# Patient Record
Sex: Female | Born: 1950 | Race: White | Hispanic: No | Smoking: Never smoker
Health system: Southern US, Community
[De-identification: ages and names within clinical notes are randomized; demographics above are authoritative.]

## PROBLEM LIST (undated history)

## (undated) DIAGNOSIS — N9489 Other specified conditions associated with female genital organs and menstrual cycle: Secondary | ICD-10-CM

## (undated) DIAGNOSIS — N809 Endometriosis, unspecified: Secondary | ICD-10-CM

## (undated) DIAGNOSIS — H269 Unspecified cataract: Secondary | ICD-10-CM

## (undated) DIAGNOSIS — Z973 Presence of spectacles and contact lenses: Secondary | ICD-10-CM

## (undated) DIAGNOSIS — F419 Anxiety disorder, unspecified: Secondary | ICD-10-CM

## (undated) DIAGNOSIS — T7840XA Allergy, unspecified, initial encounter: Secondary | ICD-10-CM

## (undated) DIAGNOSIS — I10 Essential (primary) hypertension: Secondary | ICD-10-CM

## (undated) DIAGNOSIS — M810 Age-related osteoporosis without current pathological fracture: Secondary | ICD-10-CM

## (undated) DIAGNOSIS — E7800 Pure hypercholesterolemia, unspecified: Secondary | ICD-10-CM

## (undated) DIAGNOSIS — Z9889 Other specified postprocedural states: Secondary | ICD-10-CM

## (undated) HISTORY — DX: Pure hypercholesterolemia, unspecified: E78.00

## (undated) HISTORY — DX: Age-related osteoporosis without current pathological fracture: M81.0

## (undated) HISTORY — DX: Unspecified cataract: H26.9

## (undated) HISTORY — DX: Allergy, unspecified, initial encounter: T78.40XA

## (undated) HISTORY — DX: Essential (primary) hypertension: I10

## (undated) HISTORY — DX: Endometriosis, unspecified: N80.9

---

## 1972-08-15 HISTORY — PX: WISDOM TOOTH EXTRACTION: SHX21

## 1976-08-15 HISTORY — PX: OTHER SURGICAL HISTORY: SHX169

## 1998-08-12 ENCOUNTER — Other Ambulatory Visit: Admission: RE | Admit: 1998-08-12 | Discharge: 1998-08-12 | Payer: Self-pay | Admitting: Obstetrics and Gynecology

## 1999-06-22 ENCOUNTER — Encounter (INDEPENDENT_AMBULATORY_CARE_PROVIDER_SITE_OTHER): Payer: Self-pay

## 1999-06-22 ENCOUNTER — Other Ambulatory Visit: Admission: RE | Admit: 1999-06-22 | Discharge: 1999-06-22 | Payer: Self-pay | Admitting: Obstetrics and Gynecology

## 1999-09-13 ENCOUNTER — Other Ambulatory Visit: Admission: RE | Admit: 1999-09-13 | Discharge: 1999-09-13 | Payer: Self-pay | Admitting: Obstetrics and Gynecology

## 2001-10-08 ENCOUNTER — Other Ambulatory Visit: Admission: RE | Admit: 2001-10-08 | Discharge: 2001-10-08 | Payer: Self-pay | Admitting: Gynecology

## 2004-04-02 ENCOUNTER — Other Ambulatory Visit: Admission: RE | Admit: 2004-04-02 | Discharge: 2004-04-02 | Payer: Self-pay | Admitting: Obstetrics and Gynecology

## 2005-10-20 ENCOUNTER — Other Ambulatory Visit: Admission: RE | Admit: 2005-10-20 | Discharge: 2005-10-20 | Payer: Self-pay | Admitting: Obstetrics & Gynecology

## 2008-11-06 ENCOUNTER — Other Ambulatory Visit: Admission: RE | Admit: 2008-11-06 | Discharge: 2008-11-06 | Payer: Self-pay | Admitting: Family Medicine

## 2011-01-06 ENCOUNTER — Other Ambulatory Visit: Payer: Self-pay | Admitting: Family Medicine

## 2011-01-06 DIAGNOSIS — M545 Low back pain, unspecified: Secondary | ICD-10-CM

## 2011-01-15 ENCOUNTER — Ambulatory Visit
Admission: RE | Admit: 2011-01-15 | Discharge: 2011-01-15 | Disposition: A | Discharge status: Home / Self Care | Payer: BC Managed Care – PPO | Source: Ambulatory Visit | Attending: Family Medicine | Admitting: Family Medicine

## 2011-01-15 DIAGNOSIS — M545 Low back pain, unspecified: Secondary | ICD-10-CM

## 2012-08-15 HISTORY — PX: OTHER SURGICAL HISTORY: SHX169

## 2013-03-04 DIAGNOSIS — C4491 Basal cell carcinoma of skin, unspecified: Secondary | ICD-10-CM

## 2013-03-04 HISTORY — DX: Basal cell carcinoma of skin, unspecified: C44.91

## 2016-08-15 HISTORY — PX: OTHER SURGICAL HISTORY: SHX169

## 2017-04-12 ENCOUNTER — Encounter: Payer: Self-pay | Admitting: Obstetrics and Gynecology

## 2017-06-21 ENCOUNTER — Encounter: Payer: Self-pay | Admitting: Obstetrics and Gynecology

## 2019-05-22 DIAGNOSIS — G4700 Insomnia, unspecified: Secondary | ICD-10-CM | POA: Diagnosis not present

## 2019-05-22 DIAGNOSIS — Z7189 Other specified counseling: Secondary | ICD-10-CM | POA: Diagnosis not present

## 2019-05-28 DIAGNOSIS — J301 Allergic rhinitis due to pollen: Secondary | ICD-10-CM | POA: Diagnosis not present

## 2019-05-28 DIAGNOSIS — J3089 Other allergic rhinitis: Secondary | ICD-10-CM | POA: Diagnosis not present

## 2019-06-04 DIAGNOSIS — Z23 Encounter for immunization: Secondary | ICD-10-CM | POA: Diagnosis not present

## 2019-06-06 DIAGNOSIS — H2513 Age-related nuclear cataract, bilateral: Secondary | ICD-10-CM | POA: Diagnosis not present

## 2019-06-06 DIAGNOSIS — H40013 Open angle with borderline findings, low risk, bilateral: Secondary | ICD-10-CM | POA: Diagnosis not present

## 2019-06-06 DIAGNOSIS — H43812 Vitreous degeneration, left eye: Secondary | ICD-10-CM | POA: Diagnosis not present

## 2019-06-06 DIAGNOSIS — H04123 Dry eye syndrome of bilateral lacrimal glands: Secondary | ICD-10-CM | POA: Diagnosis not present

## 2019-06-10 DIAGNOSIS — J301 Allergic rhinitis due to pollen: Secondary | ICD-10-CM | POA: Diagnosis not present

## 2019-06-10 DIAGNOSIS — J3089 Other allergic rhinitis: Secondary | ICD-10-CM | POA: Diagnosis not present

## 2019-06-24 DIAGNOSIS — J301 Allergic rhinitis due to pollen: Secondary | ICD-10-CM | POA: Diagnosis not present

## 2019-06-24 DIAGNOSIS — J3089 Other allergic rhinitis: Secondary | ICD-10-CM | POA: Diagnosis not present

## 2019-06-24 DIAGNOSIS — H1045 Other chronic allergic conjunctivitis: Secondary | ICD-10-CM | POA: Diagnosis not present

## 2019-07-08 DIAGNOSIS — J3089 Other allergic rhinitis: Secondary | ICD-10-CM | POA: Diagnosis not present

## 2019-07-08 DIAGNOSIS — J301 Allergic rhinitis due to pollen: Secondary | ICD-10-CM | POA: Diagnosis not present

## 2019-07-23 DIAGNOSIS — J301 Allergic rhinitis due to pollen: Secondary | ICD-10-CM | POA: Diagnosis not present

## 2019-07-23 DIAGNOSIS — J3089 Other allergic rhinitis: Secondary | ICD-10-CM | POA: Diagnosis not present

## 2019-08-05 DIAGNOSIS — J3089 Other allergic rhinitis: Secondary | ICD-10-CM | POA: Diagnosis not present

## 2019-08-05 DIAGNOSIS — J301 Allergic rhinitis due to pollen: Secondary | ICD-10-CM | POA: Diagnosis not present

## 2019-08-20 DIAGNOSIS — J3089 Other allergic rhinitis: Secondary | ICD-10-CM | POA: Diagnosis not present

## 2019-08-20 DIAGNOSIS — J301 Allergic rhinitis due to pollen: Secondary | ICD-10-CM | POA: Diagnosis not present

## 2019-09-03 DIAGNOSIS — J301 Allergic rhinitis due to pollen: Secondary | ICD-10-CM | POA: Diagnosis not present

## 2019-09-03 DIAGNOSIS — J3089 Other allergic rhinitis: Secondary | ICD-10-CM | POA: Diagnosis not present

## 2019-09-16 DIAGNOSIS — J301 Allergic rhinitis due to pollen: Secondary | ICD-10-CM | POA: Diagnosis not present

## 2019-09-16 DIAGNOSIS — J3089 Other allergic rhinitis: Secondary | ICD-10-CM | POA: Diagnosis not present

## 2019-09-24 DIAGNOSIS — J301 Allergic rhinitis due to pollen: Secondary | ICD-10-CM | POA: Diagnosis not present

## 2019-09-24 DIAGNOSIS — J3089 Other allergic rhinitis: Secondary | ICD-10-CM | POA: Diagnosis not present

## 2019-10-01 DIAGNOSIS — J3089 Other allergic rhinitis: Secondary | ICD-10-CM | POA: Diagnosis not present

## 2019-10-01 DIAGNOSIS — J301 Allergic rhinitis due to pollen: Secondary | ICD-10-CM | POA: Diagnosis not present

## 2019-10-07 DIAGNOSIS — J301 Allergic rhinitis due to pollen: Secondary | ICD-10-CM | POA: Diagnosis not present

## 2019-10-07 DIAGNOSIS — J3089 Other allergic rhinitis: Secondary | ICD-10-CM | POA: Diagnosis not present

## 2019-10-10 DIAGNOSIS — J301 Allergic rhinitis due to pollen: Secondary | ICD-10-CM | POA: Diagnosis not present

## 2019-10-10 DIAGNOSIS — J3089 Other allergic rhinitis: Secondary | ICD-10-CM | POA: Diagnosis not present

## 2019-10-17 DIAGNOSIS — J3089 Other allergic rhinitis: Secondary | ICD-10-CM | POA: Diagnosis not present

## 2019-10-17 DIAGNOSIS — J301 Allergic rhinitis due to pollen: Secondary | ICD-10-CM | POA: Diagnosis not present

## 2019-10-24 DIAGNOSIS — J3089 Other allergic rhinitis: Secondary | ICD-10-CM | POA: Diagnosis not present

## 2019-10-24 DIAGNOSIS — J301 Allergic rhinitis due to pollen: Secondary | ICD-10-CM | POA: Diagnosis not present

## 2019-10-25 DIAGNOSIS — I10 Essential (primary) hypertension: Secondary | ICD-10-CM | POA: Diagnosis not present

## 2019-10-25 DIAGNOSIS — Z1159 Encounter for screening for other viral diseases: Secondary | ICD-10-CM | POA: Diagnosis not present

## 2019-10-30 DIAGNOSIS — G4700 Insomnia, unspecified: Secondary | ICD-10-CM | POA: Diagnosis not present

## 2019-10-30 DIAGNOSIS — Z1159 Encounter for screening for other viral diseases: Secondary | ICD-10-CM | POA: Diagnosis not present

## 2019-10-30 DIAGNOSIS — Z86010 Personal history of colonic polyps: Secondary | ICD-10-CM | POA: Diagnosis not present

## 2019-10-30 DIAGNOSIS — M8580 Other specified disorders of bone density and structure, unspecified site: Secondary | ICD-10-CM | POA: Diagnosis not present

## 2019-10-30 DIAGNOSIS — I10 Essential (primary) hypertension: Secondary | ICD-10-CM | POA: Diagnosis not present

## 2019-10-30 DIAGNOSIS — R69 Illness, unspecified: Secondary | ICD-10-CM | POA: Diagnosis not present

## 2019-10-30 DIAGNOSIS — E785 Hyperlipidemia, unspecified: Secondary | ICD-10-CM | POA: Diagnosis not present

## 2019-10-30 DIAGNOSIS — H6123 Impacted cerumen, bilateral: Secondary | ICD-10-CM | POA: Diagnosis not present

## 2019-10-30 DIAGNOSIS — R8290 Unspecified abnormal findings in urine: Secondary | ICD-10-CM | POA: Diagnosis not present

## 2019-10-30 DIAGNOSIS — Z0000 Encounter for general adult medical examination without abnormal findings: Secondary | ICD-10-CM | POA: Diagnosis not present

## 2019-10-31 DIAGNOSIS — J3089 Other allergic rhinitis: Secondary | ICD-10-CM | POA: Diagnosis not present

## 2019-10-31 DIAGNOSIS — J301 Allergic rhinitis due to pollen: Secondary | ICD-10-CM | POA: Diagnosis not present

## 2019-11-06 DIAGNOSIS — J301 Allergic rhinitis due to pollen: Secondary | ICD-10-CM | POA: Diagnosis not present

## 2019-11-06 DIAGNOSIS — J3089 Other allergic rhinitis: Secondary | ICD-10-CM | POA: Diagnosis not present

## 2019-11-11 DIAGNOSIS — M79662 Pain in left lower leg: Secondary | ICD-10-CM | POA: Diagnosis not present

## 2019-11-13 DIAGNOSIS — J301 Allergic rhinitis due to pollen: Secondary | ICD-10-CM | POA: Diagnosis not present

## 2019-11-13 DIAGNOSIS — J3089 Other allergic rhinitis: Secondary | ICD-10-CM | POA: Diagnosis not present

## 2019-11-19 DIAGNOSIS — J3089 Other allergic rhinitis: Secondary | ICD-10-CM | POA: Diagnosis not present

## 2019-11-19 DIAGNOSIS — J301 Allergic rhinitis due to pollen: Secondary | ICD-10-CM | POA: Diagnosis not present

## 2019-11-26 DIAGNOSIS — J3089 Other allergic rhinitis: Secondary | ICD-10-CM | POA: Diagnosis not present

## 2019-11-26 DIAGNOSIS — J301 Allergic rhinitis due to pollen: Secondary | ICD-10-CM | POA: Diagnosis not present

## 2019-11-28 DIAGNOSIS — R69 Illness, unspecified: Secondary | ICD-10-CM | POA: Diagnosis not present

## 2019-12-09 DIAGNOSIS — J3089 Other allergic rhinitis: Secondary | ICD-10-CM | POA: Diagnosis not present

## 2019-12-09 DIAGNOSIS — J301 Allergic rhinitis due to pollen: Secondary | ICD-10-CM | POA: Diagnosis not present

## 2019-12-23 DIAGNOSIS — J301 Allergic rhinitis due to pollen: Secondary | ICD-10-CM | POA: Diagnosis not present

## 2019-12-23 DIAGNOSIS — J3089 Other allergic rhinitis: Secondary | ICD-10-CM | POA: Diagnosis not present

## 2020-01-06 DIAGNOSIS — J301 Allergic rhinitis due to pollen: Secondary | ICD-10-CM | POA: Diagnosis not present

## 2020-01-06 DIAGNOSIS — J3089 Other allergic rhinitis: Secondary | ICD-10-CM | POA: Diagnosis not present

## 2020-01-15 DIAGNOSIS — Z03818 Encounter for observation for suspected exposure to other biological agents ruled out: Secondary | ICD-10-CM | POA: Diagnosis not present

## 2020-01-20 DIAGNOSIS — K5730 Diverticulosis of large intestine without perforation or abscess without bleeding: Secondary | ICD-10-CM | POA: Diagnosis not present

## 2020-01-20 DIAGNOSIS — Z86010 Personal history of colonic polyps: Secondary | ICD-10-CM | POA: Diagnosis not present

## 2020-01-20 DIAGNOSIS — K635 Polyp of colon: Secondary | ICD-10-CM | POA: Diagnosis not present

## 2020-01-20 LAB — HM COLONOSCOPY

## 2020-01-22 DIAGNOSIS — K635 Polyp of colon: Secondary | ICD-10-CM | POA: Diagnosis not present

## 2020-01-23 DIAGNOSIS — J3089 Other allergic rhinitis: Secondary | ICD-10-CM | POA: Diagnosis not present

## 2020-01-23 DIAGNOSIS — J301 Allergic rhinitis due to pollen: Secondary | ICD-10-CM | POA: Diagnosis not present

## 2020-02-03 DIAGNOSIS — J3081 Allergic rhinitis due to animal (cat) (dog) hair and dander: Secondary | ICD-10-CM | POA: Diagnosis not present

## 2020-02-03 DIAGNOSIS — J3089 Other allergic rhinitis: Secondary | ICD-10-CM | POA: Diagnosis not present

## 2020-02-03 DIAGNOSIS — J301 Allergic rhinitis due to pollen: Secondary | ICD-10-CM | POA: Diagnosis not present

## 2020-02-19 DIAGNOSIS — J301 Allergic rhinitis due to pollen: Secondary | ICD-10-CM | POA: Diagnosis not present

## 2020-02-19 DIAGNOSIS — J3089 Other allergic rhinitis: Secondary | ICD-10-CM | POA: Diagnosis not present

## 2020-02-19 DIAGNOSIS — Z1231 Encounter for screening mammogram for malignant neoplasm of breast: Secondary | ICD-10-CM | POA: Diagnosis not present

## 2020-02-24 DIAGNOSIS — H52223 Regular astigmatism, bilateral: Secondary | ICD-10-CM | POA: Diagnosis not present

## 2020-02-24 DIAGNOSIS — Z0100 Encounter for examination of eyes and vision without abnormal findings: Secondary | ICD-10-CM | POA: Diagnosis not present

## 2020-02-25 DIAGNOSIS — J3089 Other allergic rhinitis: Secondary | ICD-10-CM | POA: Diagnosis not present

## 2020-02-25 DIAGNOSIS — J301 Allergic rhinitis due to pollen: Secondary | ICD-10-CM | POA: Diagnosis not present

## 2020-02-25 DIAGNOSIS — J3081 Allergic rhinitis due to animal (cat) (dog) hair and dander: Secondary | ICD-10-CM | POA: Diagnosis not present

## 2020-02-27 DIAGNOSIS — J3089 Other allergic rhinitis: Secondary | ICD-10-CM | POA: Diagnosis not present

## 2020-02-27 DIAGNOSIS — J301 Allergic rhinitis due to pollen: Secondary | ICD-10-CM | POA: Diagnosis not present

## 2020-03-02 DIAGNOSIS — J3089 Other allergic rhinitis: Secondary | ICD-10-CM | POA: Diagnosis not present

## 2020-03-02 DIAGNOSIS — J301 Allergic rhinitis due to pollen: Secondary | ICD-10-CM | POA: Diagnosis not present

## 2020-03-09 DIAGNOSIS — J3089 Other allergic rhinitis: Secondary | ICD-10-CM | POA: Diagnosis not present

## 2020-03-09 DIAGNOSIS — J301 Allergic rhinitis due to pollen: Secondary | ICD-10-CM | POA: Diagnosis not present

## 2020-03-16 DIAGNOSIS — J3089 Other allergic rhinitis: Secondary | ICD-10-CM | POA: Diagnosis not present

## 2020-03-16 DIAGNOSIS — J301 Allergic rhinitis due to pollen: Secondary | ICD-10-CM | POA: Diagnosis not present

## 2020-03-23 DIAGNOSIS — J301 Allergic rhinitis due to pollen: Secondary | ICD-10-CM | POA: Diagnosis not present

## 2020-03-23 DIAGNOSIS — J3089 Other allergic rhinitis: Secondary | ICD-10-CM | POA: Diagnosis not present

## 2020-03-30 DIAGNOSIS — J3089 Other allergic rhinitis: Secondary | ICD-10-CM | POA: Diagnosis not present

## 2020-03-30 DIAGNOSIS — J301 Allergic rhinitis due to pollen: Secondary | ICD-10-CM | POA: Diagnosis not present

## 2020-04-06 DIAGNOSIS — J3089 Other allergic rhinitis: Secondary | ICD-10-CM | POA: Diagnosis not present

## 2020-04-06 DIAGNOSIS — J301 Allergic rhinitis due to pollen: Secondary | ICD-10-CM | POA: Diagnosis not present

## 2020-04-13 DIAGNOSIS — J3089 Other allergic rhinitis: Secondary | ICD-10-CM | POA: Diagnosis not present

## 2020-04-13 DIAGNOSIS — J301 Allergic rhinitis due to pollen: Secondary | ICD-10-CM | POA: Diagnosis not present

## 2020-04-22 DIAGNOSIS — J3089 Other allergic rhinitis: Secondary | ICD-10-CM | POA: Diagnosis not present

## 2020-04-22 DIAGNOSIS — J301 Allergic rhinitis due to pollen: Secondary | ICD-10-CM | POA: Diagnosis not present

## 2020-04-24 DIAGNOSIS — J301 Allergic rhinitis due to pollen: Secondary | ICD-10-CM | POA: Diagnosis not present

## 2020-04-24 DIAGNOSIS — J3089 Other allergic rhinitis: Secondary | ICD-10-CM | POA: Diagnosis not present

## 2020-05-01 DIAGNOSIS — J3081 Allergic rhinitis due to animal (cat) (dog) hair and dander: Secondary | ICD-10-CM | POA: Diagnosis not present

## 2020-05-01 DIAGNOSIS — J3089 Other allergic rhinitis: Secondary | ICD-10-CM | POA: Diagnosis not present

## 2020-05-06 ENCOUNTER — Ambulatory Visit: Payer: Self-pay | Admitting: Dermatology

## 2020-05-06 DIAGNOSIS — R69 Illness, unspecified: Secondary | ICD-10-CM | POA: Diagnosis not present

## 2020-05-06 DIAGNOSIS — G4700 Insomnia, unspecified: Secondary | ICD-10-CM | POA: Diagnosis not present

## 2020-05-06 DIAGNOSIS — E785 Hyperlipidemia, unspecified: Secondary | ICD-10-CM | POA: Diagnosis not present

## 2020-05-07 DIAGNOSIS — Z228 Carrier of other infectious diseases: Secondary | ICD-10-CM | POA: Diagnosis not present

## 2020-05-15 DIAGNOSIS — J301 Allergic rhinitis due to pollen: Secondary | ICD-10-CM | POA: Diagnosis not present

## 2020-05-15 DIAGNOSIS — J3089 Other allergic rhinitis: Secondary | ICD-10-CM | POA: Diagnosis not present

## 2020-05-29 DIAGNOSIS — J301 Allergic rhinitis due to pollen: Secondary | ICD-10-CM | POA: Diagnosis not present

## 2020-05-29 DIAGNOSIS — J3089 Other allergic rhinitis: Secondary | ICD-10-CM | POA: Diagnosis not present

## 2020-06-11 DIAGNOSIS — R69 Illness, unspecified: Secondary | ICD-10-CM | POA: Diagnosis not present

## 2020-06-12 DIAGNOSIS — J301 Allergic rhinitis due to pollen: Secondary | ICD-10-CM | POA: Diagnosis not present

## 2020-06-12 DIAGNOSIS — J3089 Other allergic rhinitis: Secondary | ICD-10-CM | POA: Diagnosis not present

## 2020-06-23 DIAGNOSIS — H1045 Other chronic allergic conjunctivitis: Secondary | ICD-10-CM | POA: Diagnosis not present

## 2020-06-23 DIAGNOSIS — J3089 Other allergic rhinitis: Secondary | ICD-10-CM | POA: Diagnosis not present

## 2020-06-23 DIAGNOSIS — J301 Allergic rhinitis due to pollen: Secondary | ICD-10-CM | POA: Diagnosis not present

## 2020-06-25 DIAGNOSIS — R69 Illness, unspecified: Secondary | ICD-10-CM | POA: Diagnosis not present

## 2020-07-20 ENCOUNTER — Ambulatory Visit: Payer: Medicare HMO | Admitting: Dermatology

## 2020-07-20 ENCOUNTER — Other Ambulatory Visit: Payer: Self-pay

## 2020-07-20 ENCOUNTER — Encounter: Payer: Self-pay | Admitting: Dermatology

## 2020-07-20 DIAGNOSIS — D2339 Other benign neoplasm of skin of other parts of face: Secondary | ICD-10-CM | POA: Diagnosis not present

## 2020-07-20 DIAGNOSIS — L570 Actinic keratosis: Secondary | ICD-10-CM

## 2020-07-20 DIAGNOSIS — L738 Other specified follicular disorders: Secondary | ICD-10-CM | POA: Diagnosis not present

## 2020-07-20 DIAGNOSIS — Z1283 Encounter for screening for malignant neoplasm of skin: Secondary | ICD-10-CM | POA: Diagnosis not present

## 2020-07-20 DIAGNOSIS — L821 Other seborrheic keratosis: Secondary | ICD-10-CM

## 2020-07-20 DIAGNOSIS — L700 Acne vulgaris: Secondary | ICD-10-CM | POA: Diagnosis not present

## 2020-07-20 DIAGNOSIS — D1801 Hemangioma of skin and subcutaneous tissue: Secondary | ICD-10-CM | POA: Diagnosis not present

## 2020-07-20 NOTE — Patient Instructions (Addendum)
Routine follow-up visit for Tracey Ayala date of birth 22-Sep-1950.  Of most concern was a crust on her left mid deltoid which fits a lichenoid actinic keratosis, sometimes called a precancer.  If 5-second liquid nitrogen freeze does not succeed, you should return sometime after the holidays for a tiny biopsy.  A similar lesion on the left cheek was also treated with liquid nitrogen freeze.  Otherwise from feet to scalp there are no atypical moles or any lesions suggestive of skin cancer.  5 types of clinically benign spots: (#1) tiny flesh-colored bumps on the forehead called sebaceous hyperplasia; #2) a 1 mm red dot on the left tip of the nose that fits an angiofibroma; #3 many dozen 1 to 4 mm red dots mostly on the torso that are called cherry angiomas; #4 (rough tan spots particularly on the back called seborrheic keratoses; #5 a dark dot on the left temple that was gently expressed and is a tiny open cyst that will eventually recur but is harmless.  Please let the office know in a month with a spot on the left shoulder goes away.

## 2020-07-21 DIAGNOSIS — J301 Allergic rhinitis due to pollen: Secondary | ICD-10-CM | POA: Diagnosis not present

## 2020-07-21 DIAGNOSIS — J3089 Other allergic rhinitis: Secondary | ICD-10-CM | POA: Diagnosis not present

## 2020-07-24 ENCOUNTER — Encounter: Payer: Self-pay | Admitting: Dermatology

## 2020-07-24 NOTE — Progress Notes (Signed)
Follow-Up Visit   Subjective  Tracey Ayala is a 69 y.o. female who presents for the following: Annual Exam (left shoulder, right leg, left nose = nonhealing crust).  General skin examination, several new issues Location:  Duration:  Quality:  Associated Signs/Symptoms: Modifying Factors:  Severity:  Timing: Context:   Objective  Well appearing patient in no apparent distress; mood and affect are within normal limits. Objective  Chest - Medial [facility]): Complete skin examination, no signs of atypical moles, melanoma or non mole skin cancer.  Objective  Left Upper Arm - Anterior: 1 cm flat pink crust  Objective  Left Thigh - Anterior, Left Upper Back, Right Lower Back, Right Thigh - Anterior, Umbilicus: Multiple 1 to 2 mm smooth red dots  Objective  Mid Forehead: Half dozen 2 mm umbilicated flesh-colored papules  Objective  Mid Tip of Nose: 2 mm domed flesh-colored papule.  Objective  Left Malar Cheek: Ten 15mm flattopped textured papule  Objective  Left Malar Cheek: 4 mm pink crust    A full examination was performed including scalp, head, eyes, ears, nose, lips, neck, chest, axillae, abdomen, back, buttocks, bilateral upper extremities, bilateral lower extremities, hands, feet, fingers, toes, fingernails, and toenails. All findings within normal limits unless otherwise noted below.   Assessment & Plan    Screening exam for skin cancer Chest - Medial [facility])  Yearly skin check, patient encouraged to examine her own skin twice annually.  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  Black head Left Temple  Benign   Sebaceous hyperplasia Mid Forehead  Benign okay to leave.  Did  warn of the similarity in appearance to an early basal cell carcinoma so if any grow or bleed, return for biopsy.  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  Did detail similarity in appearance to early basal cell carcinoma.  Seborrheic keratosis Left Malar Cheek  Benign okay to leave if stable  AK (actinic keratosis) Left Malar Cheek  Destruction of lesion - Left Malar Cheek Complexity: simple   Destruction method: cryotherapy   Informed consent: discussed and consent obtained   Lesion destroyed using liquid nitrogen: Yes   Cryotherapy cycles:  5 Outcome: patient tolerated procedure well with no complications     Routine follow-up visit for Tracey Ayala date of birth 1951/04/23.  Of most concern was a crust on her left mid deltoid which fits a lichenoid actinic keratosis, sometimes called a precancer.  If 5-second liquid nitrogen freeze does not succeed, you should return sometime after the holidays for a tiny biopsy.  A similar lesion on the left cheek was also treated with liquid nitrogen freeze.  Otherwise from feet to scalp there are no atypical moles or any lesions suggestive of skin cancer.  5 types of clinically benign spots: (#1) tiny flesh-colored bumps on the forehead called sebaceous hyperplasia; #2) a 1 mm red  dot on the left tip of the nose that fits an angiofibroma; #3 many dozen 1 to 4 mm red dots mostly on the torso that are called cherry angiomas; #4 (rough tan spots particularly on the back called seborrheic keratoses; #5 a dark dot on the left temple that was gently expressed and is a tiny open cyst that  will eventually recur but is harmless.  Please let the office know in a month with a spot on the left shoulder goes away.   I, Lavonna Monarch, MD, have reviewed all documentation for this visit.  The documentation on 07/24/20 for the exam, diagnosis, procedures, and orders are all accurate and complete.

## 2020-08-11 DIAGNOSIS — J3089 Other allergic rhinitis: Secondary | ICD-10-CM | POA: Diagnosis not present

## 2020-08-11 DIAGNOSIS — J301 Allergic rhinitis due to pollen: Secondary | ICD-10-CM | POA: Diagnosis not present

## 2020-08-24 DIAGNOSIS — J3089 Other allergic rhinitis: Secondary | ICD-10-CM | POA: Diagnosis not present

## 2020-08-24 DIAGNOSIS — J301 Allergic rhinitis due to pollen: Secondary | ICD-10-CM | POA: Diagnosis not present

## 2020-09-09 DIAGNOSIS — J069 Acute upper respiratory infection, unspecified: Secondary | ICD-10-CM | POA: Diagnosis not present

## 2020-09-11 DIAGNOSIS — J301 Allergic rhinitis due to pollen: Secondary | ICD-10-CM | POA: Diagnosis not present

## 2020-09-11 DIAGNOSIS — J3089 Other allergic rhinitis: Secondary | ICD-10-CM | POA: Diagnosis not present

## 2020-09-15 DIAGNOSIS — J301 Allergic rhinitis due to pollen: Secondary | ICD-10-CM | POA: Diagnosis not present

## 2020-09-15 DIAGNOSIS — J3089 Other allergic rhinitis: Secondary | ICD-10-CM | POA: Diagnosis not present

## 2020-09-22 DIAGNOSIS — J3089 Other allergic rhinitis: Secondary | ICD-10-CM | POA: Diagnosis not present

## 2020-09-22 DIAGNOSIS — J301 Allergic rhinitis due to pollen: Secondary | ICD-10-CM | POA: Diagnosis not present

## 2020-09-29 DIAGNOSIS — J301 Allergic rhinitis due to pollen: Secondary | ICD-10-CM | POA: Diagnosis not present

## 2020-09-29 DIAGNOSIS — J3089 Other allergic rhinitis: Secondary | ICD-10-CM | POA: Diagnosis not present

## 2020-10-06 DIAGNOSIS — J301 Allergic rhinitis due to pollen: Secondary | ICD-10-CM | POA: Diagnosis not present

## 2020-10-06 DIAGNOSIS — J3089 Other allergic rhinitis: Secondary | ICD-10-CM | POA: Diagnosis not present

## 2020-10-13 DIAGNOSIS — J301 Allergic rhinitis due to pollen: Secondary | ICD-10-CM | POA: Diagnosis not present

## 2020-10-13 DIAGNOSIS — J3089 Other allergic rhinitis: Secondary | ICD-10-CM | POA: Diagnosis not present

## 2020-10-20 DIAGNOSIS — J301 Allergic rhinitis due to pollen: Secondary | ICD-10-CM | POA: Diagnosis not present

## 2020-10-20 DIAGNOSIS — J3089 Other allergic rhinitis: Secondary | ICD-10-CM | POA: Diagnosis not present

## 2020-10-28 DIAGNOSIS — J3089 Other allergic rhinitis: Secondary | ICD-10-CM | POA: Diagnosis not present

## 2020-10-28 DIAGNOSIS — J301 Allergic rhinitis due to pollen: Secondary | ICD-10-CM | POA: Diagnosis not present

## 2020-11-03 DIAGNOSIS — J301 Allergic rhinitis due to pollen: Secondary | ICD-10-CM | POA: Diagnosis not present

## 2020-11-03 DIAGNOSIS — J3089 Other allergic rhinitis: Secondary | ICD-10-CM | POA: Diagnosis not present

## 2020-11-07 DIAGNOSIS — J301 Allergic rhinitis due to pollen: Secondary | ICD-10-CM | POA: Diagnosis not present

## 2020-11-07 DIAGNOSIS — J3089 Other allergic rhinitis: Secondary | ICD-10-CM | POA: Diagnosis not present

## 2020-11-09 DIAGNOSIS — Z0000 Encounter for general adult medical examination without abnormal findings: Secondary | ICD-10-CM | POA: Diagnosis not present

## 2020-11-10 DIAGNOSIS — J301 Allergic rhinitis due to pollen: Secondary | ICD-10-CM | POA: Diagnosis not present

## 2020-11-10 DIAGNOSIS — J3089 Other allergic rhinitis: Secondary | ICD-10-CM | POA: Diagnosis not present

## 2020-11-16 DIAGNOSIS — R69 Illness, unspecified: Secondary | ICD-10-CM | POA: Diagnosis not present

## 2020-11-16 DIAGNOSIS — Z2821 Immunization not carried out because of patient refusal: Secondary | ICD-10-CM | POA: Diagnosis not present

## 2020-11-16 DIAGNOSIS — R1909 Other intra-abdominal and pelvic swelling, mass and lump: Secondary | ICD-10-CM | POA: Diagnosis not present

## 2020-11-16 DIAGNOSIS — G4700 Insomnia, unspecified: Secondary | ICD-10-CM | POA: Diagnosis not present

## 2020-11-16 DIAGNOSIS — I10 Essential (primary) hypertension: Secondary | ICD-10-CM | POA: Diagnosis not present

## 2020-11-17 DIAGNOSIS — J3089 Other allergic rhinitis: Secondary | ICD-10-CM | POA: Diagnosis not present

## 2020-11-17 DIAGNOSIS — J301 Allergic rhinitis due to pollen: Secondary | ICD-10-CM | POA: Diagnosis not present

## 2020-11-19 ENCOUNTER — Other Ambulatory Visit: Payer: Self-pay | Admitting: Family Medicine

## 2020-11-19 DIAGNOSIS — R1909 Other intra-abdominal and pelvic swelling, mass and lump: Secondary | ICD-10-CM

## 2020-11-23 DIAGNOSIS — H52223 Regular astigmatism, bilateral: Secondary | ICD-10-CM | POA: Diagnosis not present

## 2020-11-26 DIAGNOSIS — H2512 Age-related nuclear cataract, left eye: Secondary | ICD-10-CM | POA: Diagnosis not present

## 2020-11-26 DIAGNOSIS — H18413 Arcus senilis, bilateral: Secondary | ICD-10-CM | POA: Diagnosis not present

## 2020-11-26 DIAGNOSIS — H25043 Posterior subcapsular polar age-related cataract, bilateral: Secondary | ICD-10-CM | POA: Diagnosis not present

## 2020-11-26 DIAGNOSIS — H25013 Cortical age-related cataract, bilateral: Secondary | ICD-10-CM | POA: Diagnosis not present

## 2020-11-26 DIAGNOSIS — H2513 Age-related nuclear cataract, bilateral: Secondary | ICD-10-CM | POA: Diagnosis not present

## 2020-11-27 ENCOUNTER — Other Ambulatory Visit: Payer: Self-pay

## 2020-11-27 ENCOUNTER — Emergency Department (HOSPITAL_COMMUNITY)
Admission: EM | Admit: 2020-11-27 | Discharge: 2020-11-27 | Disposition: A | Discharge status: Home / Self Care | Payer: Medicare HMO | Attending: Emergency Medicine | Admitting: Emergency Medicine

## 2020-11-27 ENCOUNTER — Emergency Department (HOSPITAL_COMMUNITY): Payer: Medicare HMO

## 2020-11-27 DIAGNOSIS — R918 Other nonspecific abnormal finding of lung field: Secondary | ICD-10-CM | POA: Insufficient documentation

## 2020-11-27 DIAGNOSIS — R109 Unspecified abdominal pain: Secondary | ICD-10-CM | POA: Diagnosis not present

## 2020-11-27 DIAGNOSIS — R1032 Left lower quadrant pain: Secondary | ICD-10-CM | POA: Diagnosis not present

## 2020-11-27 DIAGNOSIS — R110 Nausea: Secondary | ICD-10-CM | POA: Diagnosis not present

## 2020-11-27 DIAGNOSIS — Z85828 Personal history of other malignant neoplasm of skin: Secondary | ICD-10-CM | POA: Diagnosis not present

## 2020-11-27 DIAGNOSIS — G8929 Other chronic pain: Secondary | ICD-10-CM | POA: Insufficient documentation

## 2020-11-27 DIAGNOSIS — R1900 Intra-abdominal and pelvic swelling, mass and lump, unspecified site: Secondary | ICD-10-CM | POA: Diagnosis not present

## 2020-11-27 DIAGNOSIS — R1084 Generalized abdominal pain: Secondary | ICD-10-CM | POA: Diagnosis not present

## 2020-11-27 DIAGNOSIS — Z800 Family history of malignant neoplasm of digestive organs: Secondary | ICD-10-CM | POA: Insufficient documentation

## 2020-11-27 DIAGNOSIS — R1030 Lower abdominal pain, unspecified: Secondary | ICD-10-CM | POA: Diagnosis present

## 2020-11-27 DIAGNOSIS — Z885 Allergy status to narcotic agent status: Secondary | ICD-10-CM | POA: Insufficient documentation

## 2020-11-27 DIAGNOSIS — I10 Essential (primary) hypertension: Secondary | ICD-10-CM | POA: Diagnosis not present

## 2020-11-27 DIAGNOSIS — Z803 Family history of malignant neoplasm of breast: Secondary | ICD-10-CM | POA: Insufficient documentation

## 2020-11-27 DIAGNOSIS — R1903 Right lower quadrant abdominal swelling, mass and lump: Secondary | ICD-10-CM

## 2020-11-27 DIAGNOSIS — R1031 Right lower quadrant pain: Secondary | ICD-10-CM | POA: Diagnosis not present

## 2020-11-27 LAB — COMPREHENSIVE METABOLIC PANEL
ALT: 20 U/L (ref 0–44)
AST: 26 U/L (ref 15–41)
Albumin: 3.8 g/dL (ref 3.5–5.0)
Alkaline Phosphatase: 52 U/L (ref 38–126)
Anion gap: 6 (ref 5–15)
BUN: 9 mg/dL (ref 8–23)
CO2: 28 mmol/L (ref 22–32)
Calcium: 8.6 mg/dL — ABNORMAL LOW (ref 8.9–10.3)
Chloride: 103 mmol/L (ref 98–111)
Creatinine, Ser: 0.69 mg/dL (ref 0.44–1.00)
GFR, Estimated: >60 mL/min (ref >60)
Glucose, Bld: 142 mg/dL — ABNORMAL HIGH (ref 70–99)
Potassium: 3.2 mmol/L — ABNORMAL LOW (ref 3.5–5.1)
Sodium: 137 mmol/L (ref 135–145)
Total Bilirubin: 1.1 mg/dL (ref 0.3–1.2)
Total Protein: 6 g/dL — ABNORMAL LOW (ref 6.5–8.1)

## 2020-11-27 LAB — CBC WITH DIFFERENTIAL/PLATELET
Abs Immature Granulocytes: 0.02 10*3/uL (ref 0.00–0.07)
Basophils Absolute: 0 10*3/uL (ref 0.0–0.1)
Basophils Relative: 0 %
Eosinophils Absolute: 0 10*3/uL (ref 0.0–0.5)
Eosinophils Relative: 0 %
HCT: 37.7 % (ref 36.0–46.0)
Hemoglobin: 13.2 g/dL (ref 12.0–15.0)
Immature Granulocytes: 0 %
Lymphocytes Relative: 7 %
Lymphs Abs: 0.6 10*3/uL — ABNORMAL LOW (ref 0.7–4.0)
MCH: 31 pg (ref 26.0–34.0)
MCHC: 35 g/dL (ref 30.0–36.0)
MCV: 88.5 fL (ref 80.0–100.0)
Monocytes Absolute: 0.3 10*3/uL (ref 0.1–1.0)
Monocytes Relative: 3 %
Neutro Abs: 7.6 10*3/uL (ref 1.7–7.7)
Neutrophils Relative %: 90 %
Platelets: 185 10*3/uL (ref 150–400)
RBC: 4.26 MIL/uL (ref 3.87–5.11)
RDW: 12.5 % (ref 11.5–15.5)
WBC: 8.5 10*3/uL (ref 4.0–10.5)
nRBC: 0 % (ref 0.0–0.2)

## 2020-11-27 LAB — LACTATE DEHYDROGENASE: LDH: 182 U/L (ref 98–192)

## 2020-11-27 LAB — HCG, QUANTITATIVE, PREGNANCY: hCG, Beta Chain, Quant, S: 4 m[IU]/mL (ref <5)

## 2020-11-27 MED ORDER — TRAMADOL HCL 50 MG PO TABS
50.0000 mg | ORAL_TABLET | Freq: Once | ORAL | Status: DC
  Filled 2020-11-27: qty 1

## 2020-11-27 MED ORDER — ONDANSETRON HCL 4 MG/2ML IJ SOLN
4.0000 mg | Freq: Once | INTRAMUSCULAR | Status: AC
  Administered 2020-11-27: 4 mg via INTRAVENOUS
  Filled 2020-11-27: qty 2

## 2020-11-27 MED ORDER — TRAMADOL HCL 50 MG PO TABS: 50.0000 mg | ORAL_TABLET | Freq: Four times a day (QID) | ORAL | 0 refills | Status: AC | PRN

## 2020-11-27 MED ORDER — FENTANYL CITRATE (PF) 100 MCG/2ML IJ SOLN
50.0000 ug | Freq: Once | INTRAMUSCULAR | Status: AC
Start: 2020-11-27 — End: 2020-11-27
  Administered 2020-11-27: 50 ug via INTRAVENOUS
  Filled 2020-11-27: qty 2

## 2020-11-27 MED ORDER — IOHEXOL 300 MG/ML  SOLN
100.0000 mL | Freq: Once | INTRAMUSCULAR | Status: AC | PRN
  Administered 2020-11-27: 100 mL via INTRAVENOUS

## 2020-11-27 MED ORDER — IOHEXOL 300 MG/ML  SOLN: 100.0000 mL | Freq: Once | INTRAMUSCULAR | Status: DC | PRN

## 2020-11-27 MED ORDER — ONDANSETRON HCL 4 MG PO TABS: 4.0000 mg | ORAL_TABLET | Freq: Three times a day (TID) | ORAL | 0 refills | Status: DC | PRN

## 2020-11-27 NOTE — Consult Note (Signed)
Gynecology Consult Note  Tracey Ayala is a 70 y.o. G0 patient that was previously seen at our practice [facility].  She presents with acute on chronic abdominal pain, GYN is consulted for large solid pelvic mass.  Patient reports ~1 year of intermittent abdominal pain that has progressed over the last several weeks. She presented recently to PCP office and a mass was palpated and outpatient imaging ordered.  She also was seen at ophthalmologist and reports her position exacerbated her pain, such that she sought care here at ED tonight.   The pain is dull, sometimes sharp, favors R side.   She reports recent weight loss and decreased appetite. She denies vaginal bleeding or discharge. She reports occasional bloating or fullness. She denies changes in bowel or bladder. Occasionally she has nausea with pain exacerbation and did vomit earlier today.  Denies fever, chills, SOB, CP, LE edema or pain.  Pertinent history includes endometriosis and underwent left ovarian cystectomy several decades ago.  She has family history of colon cancer, as well as breast cancer in an aunt.   Vitals:   11/27/20 2130 11/27/20 2145  BP: (!) 178/93 (!) 169/96  Pulse: 96 92  Resp: 17 18  Temp:    SpO2: 99% 100%   Physical exam: Gen: no distress, alert, pleasant Chest: nonlabored breathing Abdomen: soft, nondistended, palpable mass in lower abdomen just R of midline.  Mobile, nontender.  No nodularity in abdominal wall.   Exam is nonacute, without rebound or guarding.  Ext: no edema, no signs of DVT  CTAP 4/15:  There is a large predominantly solid right adnexal mass measuring 9.6 x 10.1 x 6.7 cm, highly concerning for right ovarian neoplasm. This mass results in extrinsic mass effect upon the inferior vena cava. Gynecologic consultation recommended. The uterus is atrophic. Left adnexa is grossly normal.  Trace free fluid in RLQ, minimal right pleural thickening.   A/P:  - Pelvic mass concerning  for ovarian origin.  Given patient age, size, solid features, I have high concern for malignancy.  - Nonacute exam, dispo per ED. Will follow if admitted or observed inpatient.   - Tumor markers ordered.   - Discussed with patient in detail, and recommend GYN Oncology referral.  Given stability and pending lab workup, believe a timely ambulatory referral is appropriate.   ED will place referral but our office will also follow up to ensure continued care.  Please contact with any other questions or concerns. Alpha Gula MD OBG[facility] [place]

## 2020-11-27 NOTE — Discharge Instructions (Addendum)
You have been seen and discharged from the emergency department.  You were found to have a pelvic mass.  You have been referred to gynecologic oncology.  They should call you to set up an appointment, if you do not hear from them by mid next week I would contact them.  Take Tylenol/ibuprofen as needed for pain control.  You have been sent a prescription for tramadol.  Take this medication as needed for pain control, use nausea medicine as needed.  Do not mix this pain medication with Klonopin or alcohol or other sedating medications. Do not drive or do heavy physical activity and to know how this medication affects you.  It may cause drowsiness.  Follow-up with your primary provider for reevaluation and further care. Take home medications as prescribed. If you have any worsening symptoms or further concerns for health please return to an emergency department for further evaluation.

## 2020-11-27 NOTE — ED Notes (Signed)
Pt ambulated to bathroom with Dyann Ruddle, NT

## 2020-11-27 NOTE — ED Notes (Signed)
Pt transported to CT ?

## 2020-11-27 NOTE — ED Notes (Signed)
E-signature pad unavailable at time of pt discharge. This RN discussed discharge materials with pt and answered all pt questions. Pt stated understanding of discharge material. ? ?

## 2020-11-27 NOTE — ED Notes (Signed)
Pt returned from CT °

## 2020-11-27 NOTE — ED Triage Notes (Signed)
BIB EMS from home for abd pain. Rec'd 150 mcg fentanyl, 4 mg zofran, and 500 ml NS. Patient saw her PCP last Monday and had fullness in lower abd. Today at 5 am voided normally. None since then. Now reports right low pelvic pain.

## 2020-11-27 NOTE — ED Provider Notes (Signed)
[facility] EMERGENCY DEPARTMENT Provider Note   CSN: [id number] Arrival date & time: 11/27/20  1531     History Chief Complaint  Patient presents with  . Abdominal Pain    Tracey Ayala is a 70 y.o. female.  HPI   70 year old female with past medical history of basal cell carcinoma presents the emergency department with concern for lower abdominal discomfort.  Patient states that this has been going on intermittently for the past couple months.  She said the pain is mainly in the right lower quadrant, crampy/sharp in nature.  She denies any diarrhea but endorses decreased appetite, mild nausea.  She sometimes feels like she is a difficult time relieving herself of urine but denies any dysuria or hematuria.  She was seen by her primary doctor about a week ago and they were concerned for a palpable firm mass in her lower abdomen, they had ordered CT and ultrasound of which she has not been able to get done yet.  She presents today because the pain became more severe.  Denies any fever.  Past Medical History:  Diagnosis Date  . Basal cell carcinoma 03/04/2013   bcc right nostril =mohs    There are no problems to display for this patient.   No past surgical history on file.   OB History   No obstetric history on file.     No family history on file.  Social History   Tobacco Use  . Smoking status: Never Smoker  . Smokeless tobacco: Never Used  Vaping Use  . Vaping Use: Never used  Substance Use Topics  . Alcohol use: Never  . Drug use: Never    Home Medications Prior to Admission medications   Medication Sig Start Date End Date Taking? Authorizing Provider  cholecalciferol (VITAMIN D3) 25 MCG (1000 UNIT) tablet Take 1,000 Units by mouth daily.   Yes [provider]  citalopram (CELEXA) 20 MG tablet Take 20 mg by mouth every evening. 06/05/20  Yes [provider]  clonazePAM (KLONOPIN) 0.5 MG tablet Take 0.5 mg by mouth at  bedtime. 06/19/20  Yes [provider]  lisinopril (ZESTRIL) 10 MG tablet Take 10 mg by mouth daily. 06/02/20  Yes [provider]  Loteprednol Etabonate 0.5 % GEL Apply 1 drop to eye 4 (four) times daily. 11/23/20  Yes [provider]  Menaquinone-7 (VITAMIN K2 PO) Take 1 tablet by mouth daily.   Yes [provider]  Omega-3 Fatty Acids (FISH OIL) 1000 MG CAPS Take 1 capsule by mouth daily.   Yes [provider]  vitamin C (ASCORBIC ACID) 500 MG tablet Take 500 mg by mouth daily.   Yes [provider]    Allergies    Codeine  Review of Systems   Review of Systems  Constitutional: Negative for chills and fever.  HENT: Negative for congestion.   Eyes: Negative for visual disturbance.  Respiratory: Negative for shortness of breath.   Cardiovascular: Negative for chest pain.  Gastrointestinal: Positive for abdominal pain and nausea. Negative for blood in stool, constipation, diarrhea, rectal pain and vomiting.  Genitourinary: Positive for difficulty urinating. Negative for dysuria.  Skin: Negative for rash.  Neurological: Negative for headaches.    Physical Exam Updated Vital Signs BP 137/81   Pulse 69   Temp 97.9 F (36.6 C) (Oral)   Resp 11   SpO2 100%   Physical Exam Vitals and nursing note reviewed.  Constitutional:      Appearance: Normal  appearance.  HENT:     Head: Normocephalic.     Mouth/Throat:     Mouth: Mucous membranes are moist.  Cardiovascular:     Rate and Rhythm: Normal rate.  Pulmonary:     Effort: Pulmonary effort is normal. No respiratory distress.  Abdominal:     Tenderness: There is abdominal tenderness in the right lower quadrant and suprapubic area. There is no guarding.     Hernia: No hernia is present.     Comments: Firm palpable mass in lower abdomen  Skin:    General: Skin is warm.  Neurological:     Mental Status: She is alert and oriented to person, place, and time. Mental status is at  baseline.  Psychiatric:        Mood and Affect: Mood normal.     ED Results / Procedures / Treatments   Labs (all labs ordered are listed, but only abnormal results are displayed) Labs Reviewed  CBC WITH DIFFERENTIAL/PLATELET - Abnormal; Notable for the following components:      Result Value   Lymphs Abs 0.6 (*)    All other components within normal limits  COMPREHENSIVE METABOLIC PANEL - Abnormal; Notable for the following components:   Potassium 3.2 (*)    Glucose, Bld 142 (*)    Calcium 8.6 (*)    Total Protein 6.0 (*)    All other components within normal limits    EKG None  Radiology No results found.  Procedures Procedures   Medications Ordered in ED Medications - No data to display  ED Course  I have reviewed the triage vital signs and the nursing notes.  Pertinent labs & imaging results that were available during my care of the patient were reviewed by me and considered in my medical decision making (see chart for details).    MDM Rules/Calculators/A&P                          70 year old female presents emergency department for evaluation of palpable lower abdominal mass.  CT shows a large adnexal mass concerning for ovarian cancer, there is some impingement on IVC.  Gynecology was consulted.  [facility] on-call physician Dr. Mardelle Matte reviewed her imaging and spoke with the patient personally.    Our plan is for discharge, outpatient follow-up with Am[facility] Kingfisher (ambulatory referral has been placed) and symptomatic pain control.  Dr. Mardelle Matte says that his office will also follow-up with the patient next week.  Patient is comfortable going home.  Patient has a codeine allergy but she states her allergy is nausea, she is never tried tramadol in the past, she is requesting stronger medicine than Tylenol and ibuprofen.  Will trial tramadol with home nausea meds.  Patient will be discharged and treated as an outpatient.  Discharge  plan and strict return to ED precautions discussed, patient verbalizes understanding and agreement.  Final Clinical Impression(s) / ED Diagnoses Final diagnoses:  None    Rx / DC Orders ED Discharge Orders    None       Lorelle Gibbs, DO 11/27/20 2301

## 2020-11-29 LAB — CEA: CEA: 1.1 ng/mL (ref 0.0–4.7)

## 2020-11-29 LAB — CA 125: Cancer Antigen (CA) 125: 16.3 U/mL (ref 0.0–38.1)

## 2020-11-29 LAB — CANCER ANTIGEN 19-9: CA 19-9: 48 U/mL — ABNORMAL HIGH (ref 0–35)

## 2020-11-29 LAB — AFP TUMOR MARKER: AFP, Serum, Tumor Marker: 4.8 ng/mL (ref 0.0–9.2)

## 2020-11-30 ENCOUNTER — Telehealth: Payer: Self-pay | Admitting: *Deleted

## 2020-11-30 DIAGNOSIS — G4700 Insomnia, unspecified: Secondary | ICD-10-CM | POA: Diagnosis not present

## 2020-11-30 DIAGNOSIS — R1900 Intra-abdominal and pelvic swelling, mass and lump, unspecified site: Secondary | ICD-10-CM | POA: Diagnosis not present

## 2020-11-30 NOTE — Telephone Encounter (Signed)
Patient called back and scheduled a new patient appt for 4/20 at 10:30 am with Dr Berline Lopes. Patient given an arrival time of 10 am. Patient given the address and phone number for the clinic. Patient also given the policy for mask and visitors

## 2020-11-30 NOTE — Telephone Encounter (Signed)
Called and left the patient a message to call the office back. Patient needs to be scheduled for a new patient appt  °

## 2020-12-01 ENCOUNTER — Encounter: Payer: Self-pay | Admitting: Gynecologic Oncology

## 2020-12-01 DIAGNOSIS — J3089 Other allergic rhinitis: Secondary | ICD-10-CM | POA: Diagnosis not present

## 2020-12-01 DIAGNOSIS — J301 Allergic rhinitis due to pollen: Secondary | ICD-10-CM | POA: Diagnosis not present

## 2020-12-01 LAB — INHIBIN B: Inhibin B: <7 pg/mL (ref 0.0–16.9)

## 2020-12-01 LAB — INHIBIN A: Inhibin-A: 3.2 pg/mL

## 2020-12-01 NOTE — Progress Notes (Signed)
GYNECOLOGIC ONCOLOGY NEW PATIENT CONSULTATION   Patient Name: Tracey Ayala  Patient Age: 70 y.o. Date of Service: 12/02/20 Referring Provider: Lavenia Atlas, DO  Primary Care Provider: [facility] [place] Provider: Jeral Pinch, MD   Assessment/Plan:  Postmenopausal patient with a complex adnexal mass.  Discussed findings of recent CT with the patient.  Reviewed that both from a symptom management standpoint as well as a diagnostic 1, I recommend proceeding with surgical excision.  Reviewed plan to proceed with diagnostic laparoscopy and robotic removal of the pelvic mass, which I suspect is one of her ovaries.  We will plan to place this in a bag and then make a mini lap for specimen removal without any intra-abdominal spillage.  We will send this to pathology for frozen section.  Recommend proceeding with at least a bilateral salpingo-oophorectomy.  If no cancer identified, could proceed with hysterectomy.  If cancer identified on frozen section, then we discussed additional staging procedures which may include lymph node biopsy, omentectomy, peritoneal biopsies.  We discussed her tumor markers.  We will several are still pending, others are normal with the exception of her CA 19-9.  This is mildly elevated and we discussed that this may be related to a cancer diagnosis.  This tumor marker can be elevated with malignancy of the GI tract, pancreas, breast, and ovary.  In terms of concurrent hysterectomy if no malignancy or borderline pathology noted, the patient would like to think about this decision.  As of today, what we decided, is that if there are significant adhesions related to her history of endometriosis and no malignancy identified, then we will proceed only with bilateral salpingo-oophorectomy.  I suspect that her intermittent periods of intense abdominal pain as well as associated nausea are related to torsion of the mass.  She has a  prescription for tramadol at home that she is using only as needed.  She requested a prescription for Zofran to be sent.  I did this as it will hopefully help keep her out of urgent care or the emergency department until we proceed with surgery.  We will try to schedule the surgery next Monday although at this time there is not space on the operating room schedule.    We discussed the plan for a robotic assisted bilateral salpingo-oophorectomy, mini laparotomy for specimen removal, possible total hysterectomy, possible staging, possible laparotomy. The risks of surgery were discussed in detail and she understands these to include infection; wound separation; hernia; vaginal cuff separation, injury to adjacent organs such as bowel, bladder, blood vessels, ureters and nerves; bleeding which may require blood transfusion; anesthesia risk; thromboembolic events; possible death; unforeseen complications; possible need for re-exploration; medical complications such as heart attack, stroke, pleural effusion and pneumonia; and, if full lymphadenectomy is performed the risk of lymphedema and lymphocyst. The patient will receive DVT and antibiotic prophylaxis as indicated. She voiced a clear understanding. She had the opportunity to ask questions. Perioperative instructions were reviewed with her. Prescriptions for post-op medications were sent to her pharmacy of choice.  A copy of this note was sent to the patient's referring provider.   70 minutes of total time was spent for this patient encounter, including preparation, face-to-face counseling with the patient and coordination of care, and documentation of the encounter.  Jeral Pinch, MD  Division of Gynecologic Oncology  Department of Obstetrics and Gynecology  [facility]  ___________________________________________  Chief Complaint: Chief Complaint  Patient presents with  .  Adnexal mass    History of Present Illness:   Tracey Ayala is a 70 y.o. y.o. female who is seen in consultation at the request of Dr. Dina Rich for an evaluation of a pelvic mass.  Patient reports developing right groin and pelvic pain a few months ago that she experienced when having bowel movements.  At that time, she tried to make an appointment with her gynecologist but she had not been seen in about 5 years, and they would not schedule her except as a new patient.  She notes also some intermittently decreased appetite at that time.  Within the last couple of weeks, she has noted several episodes of constipation, nausea and emesis, and describes having intermittent right-sided pelvic pain.  She describes this pain as very sharp when she gets it.  She continues to have episodes of decreased appetite.  Her nausea and emesis, with the episodes of intense pain.  She has had 1 episode last week where she had difficulty urinating, otherwise she denies any urinary symptoms.  She notes that over the last several weeks, she has had 3 or 4 intense periods of pain.  She also endorses early satiety as well as bloating in her lower abdomen.  She also endorses some recent weight loss.  She has been eating high-fiber cereal for constipation.  She saw her primary care office (Dr. Radene Ou) at [facility] in [place].  On exam, she was noted to have a lower abdominal mass and an ultrasound was ordered.  Since then, she was seen in the emergency department on 4/15 after an episode of intense abdominal pain with associated nausea and emesis.  CT scan at that time was notable for a 10 cm complex mostly solid adnexal mass.  She has a mostly insignificant past medical history.  In terms of her surgical history, she had a left ovarian cystectomy in the 70s with findings she reports of endometriosis.  There is a history of colon and breast cancer on her mother side of the family and her sister has a diagnosis of what sounds like early carcinoid tumor of the  appendix.  Patient lives in [place] by herself.  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  PAST MEDICAL HISTORY:  Past Medical History:  Diagnosis Date  . Adnexal mass   . Allergy    Receives injections once a week  . Anxiety   . Basal cell carcinoma 03/04/2013   bcc right nostril =mohs  . Endometriosis   . Hypercholesterolemia   . Hypertension   . PONV (postoperative nausea and vomiting)    in past no recent problems  . Wears glasses      PAST SURGICAL HISTORY:  Past Surgical History:  Procedure Laterality Date  . basal cell carcinoma from left shoulder  2018  . mohs procedure  2014   right nostril  . ovarian cystectomy and appendectomy Left 1978  . WISDOM TOOTH EXTRACTION  1974    OB/GYN HISTORY:  OB History  Gravida Para Term Preterm AB Living  0 0 0 0 0 0  SAB IAB Ectopic Multiple Live Births  0 0 0 0 0    No LMP recorded. Patient is postmenopausal.  Age at menarche: 78 Age at menopause: 40 Hx of HRT: Denies Hx of STDs: Denies Last pap: 2013 History of abnormal pap smears: Denies  SCREENING STUDIES:  Last mammogram: 2021  Last colonoscopy: 2021  MEDICATIONS: Outpatient Encounter Medications as of  12/02/2020  Medication Sig  . Cholecalciferol (VITAMIN D) 50 MCG (2000 UT) CAPS Take 2,000 Units by mouth daily. Vitamin d 3  . citalopram (CELEXA) 20 MG tablet Take 20 mg by mouth every evening.  . clonazePAM (KLONOPIN) 0.5 MG tablet Take 0.5 mg by mouth at bedtime.  Marland Kitchen ibuprofen (ADVIL) 600 MG tablet Take 1 tablet (600 mg total) by mouth every 6 (six) hours as needed for moderate pain. For AFTER surgery  . lisinopril (ZESTRIL) 10 MG tablet Take 10 mg by mouth daily.  . Menaquinone-7 (VITAMIN K2 PO) Take 1 tablet by mouth daily.  . Omega-3 Fatty Acids (FISH OIL) 1000 MG CAPS Take 1 capsule by mouth daily.  Marland Kitchen PRESCRIPTION MEDICATION Allergy Injections once a week.  . senna-docusate (SENOKOT-S) 8.6-50 MG tablet Take 2 tablets  by mouth at bedtime. For AFTER surgery, do not take if having diarrhea  . traMADol (ULTRAM) 50 MG tablet Take 1 tablet (50 mg total) by mouth every 6 (six) hours as needed for severe pain. For AFTER surgery, do not take and drive  . vitamin C (ASCORBIC ACID) 500 MG tablet Take 500 mg by mouth daily.  . Zinc 25 MG TABS Take 25 mg by mouth 3 (three) times a week.  . ondansetron (ZOFRAN) 4 MG tablet Take 1 tablet (4 mg total) by mouth every 8 (eight) hours as needed for up to 5 days for nausea or vomiting.  . traMADol (ULTRAM) 50 MG tablet Take 1 tablet (50 mg total) by mouth every 6 (six) hours as needed for up to 7 days for severe pain. (Patient not taking: No sig reported)  . [DISCONTINUED] cholecalciferol (VITAMIN D3) 25 MCG (1000 UNIT) tablet Take 1,000 Units by mouth daily.  . [DISCONTINUED] Loteprednol Etabonate 0.5 % GEL Apply 1 drop to eye 4 (four) times daily.  . [DISCONTINUED] ondansetron (ZOFRAN) 4 MG tablet Take 1 tablet (4 mg total) by mouth every 8 (eight) hours as needed for up to 5 days for nausea or vomiting. (Patient not taking: Reported on 12/01/2020)  . [DISCONTINUED] ondansetron (ZOFRAN) 4 MG tablet Take 1 tablet (4 mg total) by mouth every 8 (eight) hours as needed for up to 5 days for nausea or vomiting.  . [DISCONTINUED] Vitamin D-Vitamin K (K2 PLUS D3) 7311217926 MCG-UNIT TABS Take 1 tablet by mouth daily.   No facility-administered encounter medications on file as of 12/02/2020.    ALLERGIES:  Allergies  Allergen Reactions  . Codeine Nausea Only    Sick  Other reaction(s): GI upset nausea  . Fosamax [Alendronate] Rash     FAMILY HISTORY:  Family History  Problem Relation Age of Onset  . Cancer Sister        carcinoid tumor of the appendix  . Colon cancer Maternal Aunt   . Breast cancer Maternal Aunt   . Endometrial cancer Neg Hx   . Ovarian cancer Neg Hx   . Pancreatic cancer Neg Hx   . Prostate cancer Neg Hx      SOCIAL HISTORY:  Social Connections: Not on  file    REVIEW OF SYSTEMS:  Pertinent positives include appetite changes, weight loss, abdominal distention, urinary frequency, pelvic pain, anxiety. Denies fevers, chills, fatigue. Denies hearing loss, neck lumps or masses, mouth sores, ringing in ears or voice changes. Denies cough or wheezing.  Denies shortness of breath. Denies chest pain or palpitations. Denies leg swelling. Denies abdominal pain, blood in stools, constipation, diarrhea, nausea, vomiting, or early satiety. Denies pain with intercourse,  dysuria, hematuria or incontinence. Denies hot flashes, vaginal bleeding or vaginal discharge.   Denies joint pain, back pain or muscle pain/cramps. Denies itching, rash, or wounds. Denies dizziness, headaches, numbness or seizures. Denies swollen lymph nodes or glands, denies easy bruising or bleeding. Denies depression, confusion, or decreased concentration.  Physical Exam:  Vital Signs for this encounter:  Blood pressure (!) 155/97, pulse 75, temperature (!) 97.1 F (36.2 C), temperature source Tympanic, resp. rate 20, height 5' 2.5" (1.588 m), weight 108 lb (49 kg), SpO2 100 %. Body mass index is 19.44 kg/m. General: Alert, oriented, no acute distress.  HEENT: Normocephalic, atraumatic. Sclera anicteric.  Chest: Clear to auscultation bilaterally. No wheezes, rhonchi, or rales. Cardiovascular: Regular rate and rhythm, no murmurs, rubs, or gallops.  Abdomen:  Normoactive bowel sounds. Soft, nondistended, nontender to palpation. No masses or hepatosplenomegaly appreciated. No palpable fluid wave. Firm, mobile mass in the midpelvis and to the right, almost up to the umbilicus. Extremities: Grossly normal range of motion. Warm, well perfused. No edema bilaterally.  Skin: No rashes or lesions.  Lymphatics: No cervical, supraclavicular, or inguinal adenopathy.  GU:  Normal external female genitalia. No lesions. No discharge or bleeding.             Bladder/urethra:  No lesions or  masses, well supported bladder             Vagina: Moderately atrohpic.             Cervix: Normal appearing, no lesions.             Uterus: Small, mobile, no parametrial involvement or nodularity.             Adnexa: Smooth and firm mass, in the midline and to the right, filling much of the pelvis. No nodularity, mobile.  Rectal: confirms findings above.  LABORATORY AND RADIOLOGIC DATA:  Outside medical records were reviewed to synthesize the above history, along with the history and physical obtained during the visit.   Lab Results  Component Value Date   WBC 8.5 11/27/2020   HGB 13.2 11/27/2020   HCT 37.7 11/27/2020   PLT 185 11/27/2020   GLUCOSE 142 (H) 11/27/2020   ALT 20 11/27/2020   AST 26 11/27/2020   NA 137 11/27/2020   K 3.2 (L) 11/27/2020   CL 103 11/27/2020   CREATININE 0.69 11/27/2020   BUN 9 11/27/2020   CO2 28 11/27/2020   Tumor markers on 4/15: CA-125: 16.3 CEA: 1.1 CA 19-9: 48 LDH: 182 AFP: 4.8 Hcg: 4  CT A/P on 4/15: FINDINGS: Lower chest: Mild pleural thickening right posterior costophrenic angle measuring up to 4 mm. No pleural effusion. Bibasilar emphysema. No acute airspace disease.  Hepatobiliary: Multiple hepatic cysts are identified. Otherwise the liver is unremarkable. The gallbladder is grossly normal.  Pancreas: Unremarkable. No pancreatic ductal dilatation or surrounding inflammatory changes.  Spleen: Normal in size without focal abnormality.  Adrenals/Urinary Tract: There are multiple left renal calculi, largest measuring 5 mm. No right-sided calculi. No obstructive uropathy within either kidney.  Bladder is markedly distended, without filling defect. The adrenals are normal.  Stomach/Bowel: No bowel obstruction or ileus. No bowel wall thickening or inflammatory change. The appendix, if still present, is not well visualized.  Vascular/Lymphatic: No significant atherosclerosis. There is extrinsic compression on the  inferior vena cava by a right pelvic mass. No pathologic adenopathy.  Reproductive: There is a large predominantly solid right adnexal mass measuring 9.6 x 10.1 x 6.7 cm, highly concerning  for right ovarian neoplasm. This mass results in extrinsic mass effect upon the inferior vena cava. Gynecologic consultation recommended. The uterus is atrophic. Left adnexa is grossly normal.  Other: Trace free fluid right lower quadrant. No free intraperitoneal gas. No abdominal wall hernia.  Musculoskeletal: There are no acute or destructive bony lesions. Reconstructed images demonstrate no additional findings.  IMPRESSION: 1. Circumscribed predominantly solid right adnexal mass, highly concerning for ovarian neoplasm. Gynecologic consultation may be useful. 2. Extrinsic mass effect on the inferior vena cava due to the right adnexal mass described above. 3. Trace free fluid right lower quadrant. 4. Minimal right pleural thickening. No pleural effusion or airspace disease.

## 2020-12-01 NOTE — H&P (View-Only) (Signed)
GYNECOLOGIC ONCOLOGY NEW PATIENT CONSULTATION   Patient Name: Tracey [facility]  Patient Age: 70 y.o. Date of Service: 12/02/20 Referring Provider: Lavenia Atlas, DO  Primary Care Provider: [facility] Provider: Jeral Pinch, [facility]   Assessment/Plan:  Postmenopausal patient with a complex adnexal mass.  Discussed findings of recent CT with the patient.  Reviewed that both from a symptom management standpoint as well as a diagnostic 1, I recommend proceeding with surgical excision.  Reviewed plan to proceed with diagnostic laparoscopy and robotic removal of the pelvic mass, which I suspect is one of her ovaries.  We will plan to place this in a bag and then make a mini lap for specimen removal without any intra-abdominal spillage.  We will send this to pathology for frozen section.  Recommend proceeding with at least a bilateral salpingo-oophorectomy.  If no cancer identified, could proceed with hysterectomy.  If cancer identified on frozen section, then we discussed additional staging procedures which may include lymph node biopsy, omentectomy, peritoneal biopsies.  We discussed her tumor markers.  We will several are still pending, others are normal with the exception of her CA 19-9.  This is mildly elevated and we discussed that this may be related to a cancer diagnosis.  This tumor marker can be elevated with malignancy of the GI tract, pancreas, breast, and ovary.  In terms of concurrent hysterectomy if no malignancy or borderline pathology noted, the patient would like to think about this decision.  As of today, what we decided, is that if there are significant adhesions related to her history of endometriosis and no malignancy identified, then we will proceed only with bilateral salpingo-oophorectomy.  I suspect that her intermittent periods of intense abdominal pain as well as associated nausea are related to torsion of the mass.  She has a  prescription for tramadol at home that she is using only as needed.  She requested a prescription for Zofran to be sent.  I did this as it will hopefully help keep her out of urgent care or the emergency department until we proceed with surgery.  We will try to schedule the surgery next Monday although at this time there is not space on the operating room schedule.    We discussed the plan for a robotic assisted bilateral salpingo-oophorectomy, mini laparotomy for specimen removal, possible total hysterectomy, possible staging, possible laparotomy. The risks of surgery were discussed in detail and she understands these to include infection; wound separation; hernia; vaginal cuff separation, injury to adjacent organs such as bowel, bladder, blood vessels, ureters and nerves; bleeding which may require blood transfusion; anesthesia risk; thromboembolic events; possible death; unforeseen complications; possible need for re-exploration; medical complications such as heart attack, stroke, pleural effusion and pneumonia; and, if full lymphadenectomy is performed the risk of lymphedema and lymphocyst. The patient will receive DVT and antibiotic prophylaxis as indicated. She voiced a clear understanding. She had the opportunity to ask questions. Perioperative instructions were reviewed with her. Prescriptions for post-op medications were sent to her pharmacy of choice.  A copy of this note was sent to the patient's referring provider.   70 minutes of total time was spent for this patient encounter, including preparation, face-to-face counseling with the patient and coordination of care, and documentation of the encounter.  Tracey [facility]  Division of Gynecologic Oncology  Department of Obstetrics and Gynecology  [facility]  ___________________________________________  Chief Complaint: Chief Complaint  Patient presents with  .  Adnexal mass    History of Present Illness:   Tracey Ayala is a 70 y.o. y.o. female who is seen in consultation at the request of Tracey [facility]ala for an evaluation of a pelvic mass.  Patient reports developing right groin and pelvic pain a few months ago that she experienced when having bowel movements.  At that time, she tried to make an appointment with her gynecologist but she had not been seen in about 5 years, and they would not schedule her except as a new patient.  She notes also some intermittently decreased appetite at that time.  Within the last couple of weeks, she has noted several episodes of constipation, nausea and emesis, and describes having intermittent right-sided pelvic pain.  She describes this pain as very sharp when she gets it.  She continues to have episodes of decreased appetite.  Her nausea and emesis, with the episodes of intense pain.  She has had 1 episode last week where she had difficulty urinating, otherwise she denies any urinary symptoms.  She notes that over the last several weeks, she has had 3 or 4 intense periods of pain.  She also endorses early satiety as well as bloating in her lower abdomen.  She also endorses some recent weight loss.  She has been eating high-fiber cereal for constipation.  She saw her primary care office (Tracey [facility]) at [facility] in [place].  On exam, she was noted to have a lower abdominal mass and an ultrasound was ordered.  Since then, she was seen in the emergency department on 4/15 after an episode of intense abdominal pain with associated nausea and emesis.  CT scan at that time was notable for a 10 cm complex mostly solid adnexal mass.  She has a mostly insignificant past medical history.  In terms of her surgical history, she had a left ovarian cystectomy in the 70s with findings she reports of endometriosis.  There is a history of colon and breast cancer on her mother side of the family and her sister has a diagnosis of what sounds like early carcinoid tumor of the  appendix.  Patient lives in [place] by herself.  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  PAST MEDICAL HISTORY:  Past Medical History:  Diagnosis Date  . Adnexal mass   . Allergy    Receives injections once a week  . Anxiety   . Basal cell carcinoma 03/04/2013   bcc right nostril =mohs  . Endometriosis   . Hypercholesterolemia   . Hypertension   . PONV (postoperative nausea and vomiting)    in past no recent problems  . Wears glasses      PAST SURGICAL HISTORY:  Past Surgical History:  Procedure Laterality Date  . basal cell carcinoma from left shoulder  2018  . mohs procedure  2014   right nostril  . ovarian cystectomy and appendectomy Left 1978  . WISDOM TOOTH EXTRACTION  1974    OB/GYN HISTORY:  OB History  Gravida Para Term Preterm AB Living  0 0 0 0 0 0  SAB IAB Ectopic Multiple Live Births  0 0 0 0 0    No LMP recorded. Patient is postmenopausal.  Age at menarche: 55 Age at menopause: 15 Hx of HRT: Denies Hx of STDs: Denies Last pap: 2013 History of abnormal pap smears: Denies  SCREENING STUDIES:  Last mammogram: 2021  Last colonoscopy: 2021  MEDICATIONS: Outpatient Encounter Medications as of  12/02/2020  Medication Sig  . Cholecalciferol (VITAMIN D) 50 MCG (2000 UT) CAPS Take 2,000 Units by mouth daily. Vitamin d 3  . citalopram (CELEXA) 20 MG tablet Take 20 mg by mouth every evening.  . clonazePAM (KLONOPIN) 0.5 MG tablet Take 0.5 mg by mouth at bedtime.  Marland Kitchen ibuprofen (ADVIL) 600 MG tablet Take 1 tablet (600 mg total) by mouth every 6 (six) hours as needed for moderate pain. For AFTER surgery  . lisinopril (ZESTRIL) 10 MG tablet Take 10 mg by mouth daily.  . Menaquinone-7 (VITAMIN K2 PO) Take 1 tablet by mouth daily.  . Omega-3 Fatty Acids (FISH OIL) 1000 MG CAPS Take 1 capsule by mouth daily.  Marland Kitchen PRESCRIPTION MEDICATION Allergy Injections once a week.  . senna-docusate (SENOKOT-S) 8.6-50 MG tablet Take 2 tablets  by mouth at bedtime. For AFTER surgery, do not take if having diarrhea  . traMADol (ULTRAM) 50 MG tablet Take 1 tablet (50 mg total) by mouth every 6 (six) hours as needed for severe pain. For AFTER surgery, do not take and drive  . vitamin C (ASCORBIC ACID) 500 MG tablet Take 500 mg by mouth daily.  . Zinc 25 MG TABS Take 25 mg by mouth 3 (three) times a week.  . ondansetron (ZOFRAN) 4 MG tablet Take 1 tablet (4 mg total) by mouth every 8 (eight) hours as needed for up to 5 days for nausea or vomiting.  . traMADol (ULTRAM) 50 MG tablet Take 1 tablet (50 mg total) by mouth every 6 (six) hours as needed for up to 7 days for severe pain. (Patient not taking: No sig reported)  . [DISCONTINUED] cholecalciferol (VITAMIN D3) 25 MCG (1000 UNIT) tablet Take 1,000 Units by mouth daily.  . [DISCONTINUED] Loteprednol Etabonate 0.5 % GEL Apply 1 drop to eye 4 (four) times daily.  . [DISCONTINUED] ondansetron (ZOFRAN) 4 MG tablet Take 1 tablet (4 mg total) by mouth every 8 (eight) hours as needed for up to 5 days for nausea or vomiting. (Patient not taking: Reported on 12/01/2020)  . [DISCONTINUED] ondansetron (ZOFRAN) 4 MG tablet Take 1 tablet (4 mg total) by mouth every 8 (eight) hours as needed for up to 5 days for nausea or vomiting.  . [DISCONTINUED] Vitamin D-Vitamin K (K2 PLUS D3) [phone] MCG-UNIT TABS Take 1 tablet by mouth daily.   No facility-administered encounter medications on file as of 12/02/2020.    ALLERGIES:  Allergies  Allergen Reactions  . Codeine Nausea Only    Sick  Other reaction(s): GI upset nausea  . Fosamax [Alendronate] Rash     FAMILY HISTORY:  Family History  Problem Relation Age of Onset  . Cancer Sister        carcinoid tumor of the appendix  . Colon cancer Maternal Aunt   . Breast cancer Maternal Aunt   . Endometrial cancer Neg Hx   . Ovarian cancer Neg Hx   . Pancreatic cancer Neg Hx   . Prostate cancer Neg Hx      SOCIAL HISTORY:  Social Connections: Not on  file    REVIEW OF SYSTEMS:  Pertinent positives include appetite changes, weight loss, abdominal distention, urinary frequency, pelvic pain, anxiety. Denies fevers, chills, fatigue. Denies hearing loss, neck lumps or masses, mouth sores, ringing in ears or voice changes. Denies cough or wheezing.  Denies shortness of breath. Denies chest pain or palpitations. Denies leg swelling. Denies abdominal pain, blood in stools, constipation, diarrhea, nausea, vomiting, or early satiety. Denies pain with intercourse,  dysuria, hematuria or incontinence. Denies hot flashes, vaginal bleeding or vaginal discharge.   Denies joint pain, back pain or muscle pain/cramps. Denies itching, rash, or wounds. Denies dizziness, headaches, numbness or seizures. Denies swollen lymph nodes or glands, denies easy bruising or bleeding. Denies depression, confusion, or decreased concentration.  Physical Exam:  Vital Signs for this encounter:  Blood pressure (!) 155/97, pulse 75, temperature (!) 97.1 F (36.2 C), temperature source Tympanic, resp. rate 20, height 5' 2.5" (1.588 m), weight 108 lb (49 kg), SpO2 100 %. Body mass index is 19.44 kg/m. General: Alert, oriented, no acute distress.  HEENT: Normocephalic, atraumatic. Sclera anicteric.  Chest: Clear to auscultation bilaterally. No wheezes, rhonchi, or rales. Cardiovascular: Regular rate and rhythm, no murmurs, rubs, or gallops.  Abdomen:  Normoactive bowel sounds. Soft, nondistended, nontender to palpation. No masses or hepatosplenomegaly appreciated. No palpable fluid wave. Firm, mobile mass in the midpelvis and to the right, almost up to the umbilicus. Extremities: Grossly normal range of motion. Warm, well perfused. No edema bilaterally.  Skin: No rashes or lesions.  Lymphatics: No cervical, supraclavicular, or inguinal adenopathy.  GU:  Normal external female genitalia. No lesions. No discharge or bleeding.             Bladder/urethra:  No lesions or  masses, well supported bladder             Vagina: Moderately atrohpic.             Cervix: Normal appearing, no lesions.             Uterus: Small, mobile, no parametrial involvement or nodularity.             Adnexa: Smooth and firm mass, in the midline and to the right, filling much of the pelvis. No nodularity, mobile.  Rectal: confirms findings above.  LABORATORY AND RADIOLOGIC DATA:  Outside medical records were reviewed to synthesize the above history, along with the history and physical obtained during the visit.   Lab Results  Component Value Date   WBC 8.5 11/27/2020   HGB 13.2 11/27/2020   HCT 37.7 11/27/2020   PLT 185 11/27/2020   GLUCOSE 142 (H) 11/27/2020   ALT 20 11/27/2020   AST 26 11/27/2020   NA 137 11/27/2020   K 3.2 (L) 11/27/2020   CL 103 11/27/2020   CREATININE 0.69 11/27/2020   BUN 9 11/27/2020   CO2 28 11/27/2020   Tumor markers on 4/15: CA-125: 16.3 CEA: 1.1 CA 19-9: 48 LDH: 182 AFP: 4.8 Hcg: 4  CT A/P on 4/15: FINDINGS: Lower chest: Mild pleural thickening right posterior costophrenic angle measuring up to 4 mm. No pleural effusion. Bibasilar emphysema. No acute airspace disease.  Hepatobiliary: Multiple hepatic cysts are identified. Otherwise the liver is unremarkable. The gallbladder is grossly normal.  Pancreas: Unremarkable. No pancreatic ductal dilatation or surrounding inflammatory changes.  Spleen: Normal in size without focal abnormality.  Adrenals/Urinary Tract: There are multiple left renal calculi, largest measuring 5 mm. No right-sided calculi. No obstructive uropathy within either kidney.  Bladder is markedly distended, without filling defect. The adrenals are normal.  Stomach/Bowel: No bowel obstruction or ileus. No bowel wall thickening or inflammatory change. The appendix, if still present, is not well visualized.  Vascular/Lymphatic: No significant atherosclerosis. There is extrinsic compression on the  inferior vena cava by a right pelvic mass. No pathologic adenopathy.  Reproductive: There is a large predominantly solid right adnexal mass measuring 9.6 x 10.1 x 6.7 cm, highly concerning  for right ovarian neoplasm. This mass results in extrinsic mass effect upon the inferior vena cava. Gynecologic consultation recommended. The uterus is atrophic. Left adnexa is grossly normal.  Other: Trace free fluid right lower quadrant. No free intraperitoneal gas. No abdominal wall hernia.  Musculoskeletal: There are no acute or destructive bony lesions. Reconstructed images demonstrate no additional findings.  IMPRESSION: 1. Circumscribed predominantly solid right adnexal mass, highly concerning for ovarian neoplasm. Gynecologic consultation may be useful. 2. Extrinsic mass effect on the inferior vena cava due to the right adnexal mass described above. 3. Trace free fluid right lower quadrant. 4. Minimal right pleural thickening. No pleural effusion or airspace disease.

## 2020-12-02 ENCOUNTER — Other Ambulatory Visit: Payer: Self-pay | Admitting: Gynecologic Oncology

## 2020-12-02 ENCOUNTER — Inpatient Hospital Stay: Payer: Medicare HMO | Attending: Gynecologic Oncology | Admitting: Gynecologic Oncology

## 2020-12-02 ENCOUNTER — Encounter: Payer: Self-pay | Admitting: Gynecologic Oncology

## 2020-12-02 ENCOUNTER — Encounter (HOSPITAL_BASED_OUTPATIENT_CLINIC_OR_DEPARTMENT_OTHER): Payer: Self-pay | Admitting: Gynecologic Oncology

## 2020-12-02 ENCOUNTER — Other Ambulatory Visit: Payer: Self-pay

## 2020-12-02 VITALS — BP 155/97 | HR 75 | Temp 97.1°F | Resp 20 | Ht 62.5 in | Wt 108.0 lb

## 2020-12-02 DIAGNOSIS — Z803 Family history of malignant neoplasm of breast: Secondary | ICD-10-CM | POA: Insufficient documentation

## 2020-12-02 DIAGNOSIS — Z800 Family history of malignant neoplasm of digestive organs: Secondary | ICD-10-CM | POA: Insufficient documentation

## 2020-12-02 DIAGNOSIS — R102 Pelvic and perineal pain: Secondary | ICD-10-CM | POA: Insufficient documentation

## 2020-12-02 DIAGNOSIS — I10 Essential (primary) hypertension: Secondary | ICD-10-CM | POA: Insufficient documentation

## 2020-12-02 DIAGNOSIS — R112 Nausea with vomiting, unspecified: Secondary | ICD-10-CM | POA: Insufficient documentation

## 2020-12-02 DIAGNOSIS — R140 Abdominal distension (gaseous): Secondary | ICD-10-CM | POA: Diagnosis not present

## 2020-12-02 DIAGNOSIS — H1045 Other chronic allergic conjunctivitis: Secondary | ICD-10-CM | POA: Insufficient documentation

## 2020-12-02 DIAGNOSIS — J3081 Allergic rhinitis due to animal (cat) (dog) hair and dander: Secondary | ICD-10-CM | POA: Insufficient documentation

## 2020-12-02 DIAGNOSIS — E7800 Pure hypercholesterolemia, unspecified: Secondary | ICD-10-CM | POA: Insufficient documentation

## 2020-12-02 DIAGNOSIS — K5900 Constipation, unspecified: Secondary | ICD-10-CM | POA: Diagnosis not present

## 2020-12-02 DIAGNOSIS — D398 Neoplasm of uncertain behavior of other specified female genital organs: Secondary | ICD-10-CM | POA: Insufficient documentation

## 2020-12-02 DIAGNOSIS — R978 Other abnormal tumor markers: Secondary | ICD-10-CM | POA: Insufficient documentation

## 2020-12-02 DIAGNOSIS — Z85828 Personal history of other malignant neoplasm of skin: Secondary | ICD-10-CM | POA: Insufficient documentation

## 2020-12-02 DIAGNOSIS — R6881 Early satiety: Secondary | ICD-10-CM | POA: Insufficient documentation

## 2020-12-02 DIAGNOSIS — N9489 Other specified conditions associated with female genital organs and menstrual cycle: Secondary | ICD-10-CM | POA: Insufficient documentation

## 2020-12-02 DIAGNOSIS — F419 Anxiety disorder, unspecified: Secondary | ICD-10-CM | POA: Insufficient documentation

## 2020-12-02 DIAGNOSIS — R1900 Intra-abdominal and pelvic swelling, mass and lump, unspecified site: Secondary | ICD-10-CM | POA: Diagnosis not present

## 2020-12-02 DIAGNOSIS — Z79899 Other long term (current) drug therapy: Secondary | ICD-10-CM | POA: Diagnosis not present

## 2020-12-02 DIAGNOSIS — J309 Allergic rhinitis, unspecified: Secondary | ICD-10-CM | POA: Insufficient documentation

## 2020-12-02 MED ORDER — TRAMADOL HCL 50 MG PO TABS: 50.0000 mg | ORAL_TABLET | Freq: Four times a day (QID) | ORAL | 0 refills | Status: DC | PRN

## 2020-12-02 MED ORDER — IBUPROFEN 600 MG PO TABS: 600.0000 mg | ORAL_TABLET | Freq: Four times a day (QID) | ORAL | 0 refills | Status: DC | PRN

## 2020-12-02 MED ORDER — SENNOSIDES-DOCUSATE SODIUM 8.6-50 MG PO TABS: 2.0000 | ORAL_TABLET | Freq: Every day | ORAL | 0 refills | Status: DC

## 2020-12-02 MED ORDER — ONDANSETRON HCL 4 MG PO TABS: 4.0000 mg | ORAL_TABLET | Freq: Three times a day (TID) | ORAL | 0 refills | Status: DC | PRN

## 2020-12-02 MED ORDER — ONDANSETRON HCL 4 MG PO TABS
4.0000 mg | ORAL_TABLET | Freq: Three times a day (TID) | ORAL | 0 refills | Status: AC | PRN
Start: 2020-12-02 — End: 2020-12-07

## 2020-12-02 NOTE — Progress Notes (Signed)
YOU ARE SCHEDULED FOR A COVID TEST 12-03-2020 @230  PM . THIS TEST MUST BE DONE BEFORE SURGERY. GO TO  [facility]. [place], [place], IT IS APPROXIMATELY 2 MINUTES PAST [employer] ON THE RIGHT AND REMAIN IN YOUR CAR, THIS IS A DRIVE UP TEST. ONCE YOUR COVID TEST IS DONE PLEASE FOLLOW ALL THE QUARANTINE  INSTRUCTIONS GIVEN IN YOUR HANDOUT.      Your procedure is scheduled on 12-07-2020   Report to [facility]  At  930   A. M.   Call this number if you have problems the morning of surgery  [phone].   OUR ADDRESS IS [address].  WE ARE LOCATED IN [facility].  PLEASE BRING YOUR INSURANCE CARD AND PHOTO ID DAY OF SURGERY.  ONLY ONE PERSON ALLOWED IN FACILITY WAITING AREA.                                     REMEMBER:  DO NOT EAT FOOD, CANDY GUM OR MINTS  AFTER MIDNIGHT . YOU MAY HAVE CLEAR LIQUIDS FROM MIDNIGHT UNTIL  830 am.  NO CLEAR LIQUIDS AFTER  830 am. DAY OF SURGERY.   YOU MAY  BRUSH YOUR TEETH MORNING OF SURGERY AND RINSE YOUR MOUTH OUT, NO CHEWING GUM CANDY OR MINTS.    CLEAR LIQUID DIET   Foods Allowed                                                                     Foods Excluded  Coffee and tea, regular and decaf                             liquids that you cannot  Plain Jell-O any favor except red or purple                                           see through such as: Fruit ices (not with fruit pulp)                                     milk, soups, orange juice  Iced Popsicles                                    All solid food Carbonated beverages, regular and diet                                    Cranberry, grape and apple juices Sports drinks like Gatorade Lightly seasoned clear broth or consume(fat free) Sugar, honey syrup  Sample Menu Breakfast                                Lunch  Supper Cranberry juice                    Beef broth                             Chicken broth Jell-O                                     Grape juice                           Apple juice Coffee or tea                        Jell-O                                      Popsicle                                                Coffee or tea                        Coffee or tea  _____________________________________________________________________     TAKE THESE MEDICATIONS MORNING OF SURGERY WITH A SIP OF WATER:   None (DO NOT TAKE LISNIOPRIL DAY OF SURGERY).  ONE VISITOR IS ALLOWED IN WAITING ROOM ONLY DAY OF SURGERY.  NO VISITOR MAY SPEND THE NIGHT.  VISITOR ARE ALLOWED TO STAY UNTIL 800 PM.                                    DO NOT WEAR JEWERLY, MAKE UP. DO NOT WEAR LOTIONS, POWDERS, PERFUMES OR DEODORANT. DO NOT SHAVE FOR 24 HOURS PRIOR TO DAY OF SURGERY. MEN MAY SHAVE FACE AND NECK. CONTACTS, GLASSES, OR DENTURES MAY NOT BE WORN TO SURGERY.                                    [facility] IS NOT RESPONSIBLE  FOR ANY BELONGINGS.                                                                    Marland Kitchen           [facility] - Preparing for Surgery Before surgery, you can play an important role.  Because skin is not sterile, your skin needs to be as free of germs as possible.  You can reduce the number of germs on your skin by washing with CHG (chlorahexidine gluconate) soap before surgery.  CHG is an antiseptic cleaner which kills germs and bonds with the skin to continue killing germs even after washing. Please DO NOT use if you have an allergy to CHG or antibacterial  soaps.  If your skin becomes reddened/irritated stop using the CHG and inform your nurse when you arrive at Short Stay. Do not shave (including legs and underarms) for at least 48 hours prior to the first CHG shower.  You may shave your face/neck. Please follow these instructions carefully:  1.  Shower with CHG Soap the night before surgery and the  morning of Surgery.  2.  If you choose to wash your hair, wash  your hair first as usual with your  normal  shampoo.  3.  After you shampoo, rinse your hair and body thoroughly to remove the  shampoo.                            4.  Use CHG as you would any other liquid soap.  You can apply chg directly  to the skin and wash                      Gently with a scrungie or clean washcloth.  5.  Apply the CHG Soap to your body ONLY FROM THE NECK DOWN.   Do not use on face/ open                           Wound or open sores. Avoid contact with eyes, ears mouth and genitals (private parts).                       Wash face,  Genitals (private parts) with your normal soap.             6.  Wash thoroughly, paying special attention to the area where your surgery  will be performed.  7.  Thoroughly rinse your body with warm water from the neck down.  8.  DO NOT shower/wash with your normal soap after using and rinsing off  the CHG Soap.                9.  Pat yourself dry with a clean towel.            10.  Wear clean pajamas.            11.  Place clean sheets on your bed the night of your first shower and do not  sleep with pets. Day of Surgery : Do not apply any lotions/deodorants the morning of surgery.  Please wear clean clothes to the hospital/surgery center.  FAILURE TO FOLLOW THESE INSTRUCTIONS MAY RESULT IN THE CANCELLATION OF YOUR SURGERY PATIENT SIGNATURE_________________________________  NURSE SIGNATURE__________________________________  ________________________________________________________________________                                                        QUESTIONS Hansel Feinstein PRE OP NURSE PHONE [phone]

## 2020-12-02 NOTE — Patient Instructions (Addendum)
Preparing for your Surgery  Plan for surgery on December 07, 2020 with Dr. Jeral Pinch at [facility] You will be scheduled for a robotic assisted laparoscopic bilateral salpingo-oophorectomy (removal of both ovaries and fallopian tubes), mini laparotomy (slightly larger incision on your abdomen), possible total laparoscopic hysterectomy (removal of the uterus and cervix), possible staging if a cancer is identified, possible laparotomy (larger incision on your abdomen if needed).   Dr. Berline Lopes is going to send in a prescription for zofran for nausea if needed. Monitor for constipation since that can be a side effect.  Pre-operative Testing -You will receive a phone call from presurgical testing at [facility] to arrange for a pre-operative appointment, labs, and COVID test. The COVID test normally happens 3 days prior to the surgery and they ask that you self quarantine after the test up until surgery to decrease chance of exposure.  -Bring your insurance card, copy of an advanced directive if applicable, medication list  -At that visit, you will be asked to sign a consent for a possible blood transfusion in case a transfusion becomes necessary during surgery.  The need for a blood transfusion is rare but having consent is a necessary part of your care.     -You should not be taking blood thinners or aspirin at least ten days prior to surgery unless instructed by your surgeon.  -Do not take supplements such as fish oil (omega 3), red yeast rice, turmeric before your surgery. You want to avoid medications with aspirin in them including headache powders such as BC or Goody's), Excedrin migraine.  Day Before Surgery at [place] will be asked to take in a light diet the day before surgery. You will be advised you can have clear liquids up until 3 hours before your surgery.    Eat a light diet the day before surgery.  Examples including soups, broths, toast, yogurt,  mashed potatoes.  AVOID GAS PRODUCING FOODS. Things to avoid include carbonated beverages (fizzy beverages, sodas), raw fruits and raw vegetables (uncooked), or beans.   If your bowels are filled with gas, your surgeon will have difficulty visualizing your pelvic organs which increases your surgical risks.  Your role in recovery Your role is to become active as soon as directed by your doctor, while still giving yourself time to heal.  Rest when you feel tired. You will be asked to do the following in order to speed your recovery:  - Cough and breathe deeply. This helps to clear and expand your lungs and can prevent pneumonia after surgery.  - [place]. Do mild physical activity. Walking or moving your legs help your circulation and body functions return to normal. Do not try to get up or walk alone the first time after surgery.   -If you develop swelling on one leg or the other, pain in the back of your leg, redness/warmth in one of your legs, please call the office or go to the Emergency Room to have a doppler to rule out a blood clot. For shortness of breath, chest pain-seek care in the Emergency Room as soon as possible. - Actively manage your pain. Managing your pain lets you move in comfort. We will ask you to rate your pain on a scale of zero to 10. It is your responsibility to tell your doctor or nurse where and how much you hurt so your pain can be treated.  Special Considerations -If you are diabetic,  you may be placed on insulin after surgery to have closer control over your blood sugars to promote healing and recovery.  This does not mean that you will be discharged on insulin.  If applicable, your oral antidiabetics will be resumed when you are tolerating a solid diet.  -Your final pathology results from surgery should be available around one week after surgery and the results will be relayed to you when available.  -FMLA forms can be faxed to [phone] and  please allow 5-7 business days for completion.  Pain Management After Surgery -You have been prescribed your pain medication and bowel regimen medications before surgery so that you can have these available when you are discharged from the hospital. The pain medication is for use ONLY AFTER surgery and a new prescription will not be given.   -Make sure that you have Tylenol and Ibuprofen at home to use on a regular basis after surgery for pain control. We recommend alternating the medications every hour to six hours since they work differently and are processed in the body differently for pain relief.  -Review the attached handout on narcotic use and their risks and side effects.   Bowel Regimen -You have been prescribed Sennakot-S to take nightly to prevent constipation especially if you are taking the narcotic pain medication intermittently.  It is important to prevent constipation and drink adequate amounts of liquids. You can stop taking this medication when you are not taking pain medication and you are back on your normal bowel routine.  Risks of Surgery Risks of surgery are low but include bleeding, infection, damage to surrounding structures, re-operation, blood clots, and very rarely death.   Blood Transfusion Information (For the consent to be signed before surgery)  We will be checking your blood type before surgery so in case of emergencies, we will know what type of blood you would need.                                            WHAT IS A BLOOD TRANSFUSION?  A transfusion is the replacement of blood or some of its parts. Blood is made up of multiple cells which provide different functions.  Red blood cells carry oxygen and are used for blood loss replacement.  White blood cells fight against infection.  Platelets control bleeding.  Plasma helps clot blood.  Other blood products are available for specialized needs, such as hemophilia or other clotting disorders. BEFORE  THE TRANSFUSION  Who gives blood for transfusions?   You may be able to donate blood to be used at a later date on yourself (autologous donation).  Relatives can be asked to donate blood. This is generally not any safer than if you have received blood from a stranger. The same precautions are taken to ensure safety when a relative's blood is donated.  Healthy volunteers who are fully evaluated to make sure their blood is safe. This is blood bank blood. Transfusion therapy is the safest it has ever been in the practice of medicine. Before blood is taken from a donor, a complete history is taken to make sure that person has no history of diseases nor engages in risky social behavior (examples are intravenous drug use or sexual activity with multiple partners). The donor's travel history is screened to minimize risk of transmitting infections, such as malaria. The donated blood is tested for  signs of infectious diseases, such as HIV and hepatitis. The blood is then tested to be sure it is compatible with you in order to minimize the chance of a transfusion reaction. If you or a relative donates blood, this is often done in anticipation of surgery and is not appropriate for emergency situations. It takes many days to process the donated blood. RISKS AND COMPLICATIONS Although transfusion therapy is very safe and saves many lives, the main dangers of transfusion include:   Getting an infectious disease.  Developing a transfusion reaction. This is an allergic reaction to something in the blood you were given. Every precaution is taken to prevent this. The decision to have a blood transfusion has been considered carefully by your caregiver before blood is given. Blood is not given unless the benefits outweigh the risks.  AFTER SURGERY INSTRUCTIONS  Return to work: 4-6 weeks if applicable  Activity: 1. Be up and out of the bed during the day.  Take a nap if needed.  You may walk up steps but be careful  and use the hand rail.  Stair climbing will tire you more than you think, you may need to stop part way and rest.   2. No lifting or straining for 6 weeks over 10 pounds. No pushing, pulling, straining for 6 weeks.  3. No driving for 1 week(s).  Do not drive if you are taking narcotic pain medicine and make sure that your reaction time has returned.   4. You can shower as soon as the next day after surgery. Shower daily.  Use your regular soap and water (not directly on the incision) and pat your incision(s) dry afterwards; don't rub.  No tub baths or submerging your body in water until cleared by your surgeon. If you have the soap that was given to you by pre-surgical testing that was used before surgery, you do not need to use it afterwards because this can irritate your incisions.   5. No sexual activity and nothing in the vagina for 8 weeks IF A HYSTERECTOMY IS PERFORMED.  6. You may experience a small amount of clear drainage from your incisions, which is normal.  If the drainage persists, increases, or changes color please call the office.  7. Do not use creams, lotions, or ointments such as neosporin on your incisions after surgery until advised by your surgeon because they can cause removal of the dermabond glue on your incisions.    8. You may experience vaginal spotting after surgery or around the 6-8 week mark from surgery when the stitches at the top of the vagina begin to dissolve IF YOU HAVE A HYSTERECTOMY.  The spotting is normal but if you experience heavy bleeding, call our office.  9. Take Tylenol or ibuprofen first for pain and only use narcotic pain medication for severe pain not relieved by the Tylenol or Ibuprofen.  Monitor your Tylenol intake to a max of 4,000 mg in a 24 hour period. You can alternate these medications after surgery.  Diet: 1. Low sodium Heart Healthy Diet is recommended but you are cleared to resume your normal (before surgery) diet after your  procedure.  2. It is safe to use a laxative, such as Miralax or Colace, if you have difficulty moving your bowels. You have been prescribed Sennakot at bedtime every evening to keep bowel movements regular and to prevent constipation.    Wound Care: 1. Keep clean and dry.  Shower daily.  Reasons to call the Doctor:  Fever - Oral temperature greater than 100.4 degrees Fahrenheit  Foul-smelling vaginal discharge  Difficulty urinating  Nausea and vomiting  Increased pain at the site of the incision that is unrelieved with pain medicine.  Difficulty breathing with or without chest pain  New calf pain especially if only on one side  Sudden, continuing increased vaginal bleeding with or without clots.   Contacts: For questions or concerns you should contact:  Dr. Jeral Pinch at [phone]  Joylene John, NP at [phone]  After Hours: call [phone] and have the GYN Oncologist paged/contacted (after 5 pm or on the weekends).  Messages sent via mychart are for non-urgent matters and are not responded to after hours so for urgent needs, please call the after hours number.

## 2020-12-02 NOTE — Progress Notes (Signed)
Spoke w/ via phone for pre-op interview---pt Lab needs dos---- none              Lab results------has lab appt 12-03-2020 at 1300 for cbc bmp t & s and ekg COVID test ------12-03-2020 1435 Arrive at -------930 am 12-07-2020 NPO after MN NO Solid Food.  Clear liquids from MN until---830 am then npo Med rec completed Medications to take morning of surgery -----none Diabetic medication ----- n/a Patient instructed to bring photo id and insurance card day of surgery Patient aware to have Driver (ride ) / caregiver  Tracey Ayala short sister   for 24 hours after surgery  Patient Special Instructions -----none Pre-Op special Istructions -----none Patient verbalized understanding of instructions that were given at this phone interview. Patient denies shortness of breath, chest pain, fever, cough at this phone interview.

## 2020-12-03 ENCOUNTER — Other Ambulatory Visit (HOSPITAL_COMMUNITY)
Admission: RE | Admit: 2020-12-03 | Discharge: 2020-12-03 | Disposition: A | Discharge status: Home / Self Care | Payer: Medicare HMO | Source: Ambulatory Visit | Attending: Gynecologic Oncology | Admitting: Gynecologic Oncology

## 2020-12-03 ENCOUNTER — Encounter (HOSPITAL_COMMUNITY)
Admission: RE | Admit: 2020-12-03 | Discharge: 2020-12-03 | Disposition: A | Discharge status: Home / Self Care | Payer: Medicare HMO | Source: Ambulatory Visit | Attending: Gynecologic Oncology | Admitting: Gynecologic Oncology

## 2020-12-03 ENCOUNTER — Other Ambulatory Visit: Payer: Medicare HMO

## 2020-12-03 DIAGNOSIS — Z01818 Encounter for other preprocedural examination: Secondary | ICD-10-CM | POA: Diagnosis not present

## 2020-12-03 DIAGNOSIS — Z20822 Contact with and (suspected) exposure to covid-19: Secondary | ICD-10-CM | POA: Insufficient documentation

## 2020-12-03 LAB — CBC
HCT: 39.3 % (ref 36.0–46.0)
Hemoglobin: 13.1 g/dL (ref 12.0–15.0)
MCH: 29.6 pg (ref 26.0–34.0)
MCHC: 33.3 g/dL (ref 30.0–36.0)
MCV: 88.9 fL (ref 80.0–100.0)
Platelets: 193 10*3/uL (ref 150–400)
RBC: 4.42 MIL/uL (ref 3.87–5.11)
RDW: 12.5 % (ref 11.5–15.5)
WBC: 6.6 10*3/uL (ref 4.0–10.5)
nRBC: 0 % (ref 0.0–0.2)

## 2020-12-03 LAB — BASIC METABOLIC PANEL
Anion gap: 7 (ref 5–15)
BUN: 16 mg/dL (ref 8–23)
CO2: 31 mmol/L (ref 22–32)
Calcium: 9.3 mg/dL (ref 8.9–10.3)
Chloride: 103 mmol/L (ref 98–111)
Creatinine, Ser: 0.67 mg/dL (ref 0.44–1.00)
GFR, Estimated: >60 mL/min (ref >60)
Glucose, Bld: 76 mg/dL (ref 70–99)
Potassium: 3.5 mmol/L (ref 3.5–5.1)
Sodium: 141 mmol/L (ref 135–145)

## 2020-12-03 LAB — URINALYSIS, ROUTINE W REFLEX MICROSCOPIC
Bilirubin Urine: NEGATIVE
Glucose, UA: NEGATIVE mg/dL
Hgb urine dipstick: NEGATIVE
Ketones, ur: NEGATIVE mg/dL
Leukocytes,Ua: NEGATIVE
Nitrite: NEGATIVE
Protein, ur: NEGATIVE mg/dL
Specific Gravity, Urine: 1.002 — ABNORMAL LOW (ref 1.005–1.030)
pH: 7 (ref 5.0–8.0)

## 2020-12-03 LAB — SARS CORONAVIRUS 2 (TAT 6-24 HRS): SARS Coronavirus 2: NEGATIVE

## 2020-12-04 ENCOUNTER — Telehealth: Payer: Self-pay | Admitting: Gynecologic Oncology

## 2020-12-04 ENCOUNTER — Other Ambulatory Visit (HOSPITAL_COMMUNITY): Payer: Medicare HMO

## 2020-12-04 NOTE — Telephone Encounter (Signed)
Called patient to check in pre-operatively before surgery on Monday. All questions answered. Advised patient to arrive at 9 am since her surgery is scheduled to start at 11am at the [facility]. Advised her to call for any needs or concerns.

## 2020-12-04 NOTE — Telephone Encounter (Signed)
Called to check in on patient pre-operatively to see if she has any questions or concerns prior to her surgery on Monday. Left message.

## 2020-12-04 NOTE — Telephone Encounter (Signed)
Called to check in on patient pre-operatively to see if she has any questions or concerns prior to her surgery on Monday. Left message. Advised patient I would try her on her home number.

## 2020-12-07 ENCOUNTER — Other Ambulatory Visit: Payer: Self-pay

## 2020-12-07 ENCOUNTER — Encounter (HOSPITAL_BASED_OUTPATIENT_CLINIC_OR_DEPARTMENT_OTHER): Payer: Self-pay | Admitting: Gynecologic Oncology

## 2020-12-07 ENCOUNTER — Ambulatory Visit (HOSPITAL_BASED_OUTPATIENT_CLINIC_OR_DEPARTMENT_OTHER)
Admission: RE | Admit: 2020-12-07 | Discharge: 2020-12-07 | Disposition: A | Discharge status: Home / Self Care | Payer: Medicare HMO | Attending: Gynecologic Oncology | Admitting: Gynecologic Oncology

## 2020-12-07 ENCOUNTER — Ambulatory Visit (HOSPITAL_BASED_OUTPATIENT_CLINIC_OR_DEPARTMENT_OTHER): Payer: Medicare HMO | Admitting: Anesthesiology

## 2020-12-07 ENCOUNTER — Encounter (HOSPITAL_BASED_OUTPATIENT_CLINIC_OR_DEPARTMENT_OTHER)
Admission: RE | Disposition: A | Discharge status: Home / Self Care | Payer: Self-pay | Source: Home / Self Care | Attending: Gynecologic Oncology

## 2020-12-07 DIAGNOSIS — D251 Intramural leiomyoma of uterus: Secondary | ICD-10-CM | POA: Diagnosis not present

## 2020-12-07 DIAGNOSIS — Z800 Family history of malignant neoplasm of digestive organs: Secondary | ICD-10-CM | POA: Insufficient documentation

## 2020-12-07 DIAGNOSIS — N959 Unspecified menopausal and perimenopausal disorder: Secondary | ICD-10-CM | POA: Diagnosis not present

## 2020-12-07 DIAGNOSIS — N9489 Other specified conditions associated with female genital organs and menstrual cycle: Secondary | ICD-10-CM

## 2020-12-07 DIAGNOSIS — Z79899 Other long term (current) drug therapy: Secondary | ICD-10-CM | POA: Insufficient documentation

## 2020-12-07 DIAGNOSIS — D270 Benign neoplasm of right ovary: Secondary | ICD-10-CM | POA: Diagnosis not present

## 2020-12-07 DIAGNOSIS — D259 Leiomyoma of uterus, unspecified: Secondary | ICD-10-CM | POA: Insufficient documentation

## 2020-12-07 DIAGNOSIS — R1900 Intra-abdominal and pelvic swelling, mass and lump, unspecified site: Secondary | ICD-10-CM | POA: Diagnosis present

## 2020-12-07 DIAGNOSIS — N838 Other noninflammatory disorders of ovary, fallopian tube and broad ligament: Secondary | ICD-10-CM | POA: Diagnosis not present

## 2020-12-07 DIAGNOSIS — E7800 Pure hypercholesterolemia, unspecified: Secondary | ICD-10-CM | POA: Diagnosis not present

## 2020-12-07 DIAGNOSIS — Z885 Allergy status to narcotic agent status: Secondary | ICD-10-CM | POA: Diagnosis not present

## 2020-12-07 DIAGNOSIS — D252 Subserosal leiomyoma of uterus: Secondary | ICD-10-CM | POA: Diagnosis not present

## 2020-12-07 DIAGNOSIS — N809 Endometriosis, unspecified: Secondary | ICD-10-CM

## 2020-12-07 DIAGNOSIS — Z803 Family history of malignant neoplasm of breast: Secondary | ICD-10-CM | POA: Diagnosis not present

## 2020-12-07 DIAGNOSIS — Z888 Allergy status to other drugs, medicaments and biological substances status: Secondary | ICD-10-CM | POA: Insufficient documentation

## 2020-12-07 DIAGNOSIS — I10 Essential (primary) hypertension: Secondary | ICD-10-CM | POA: Diagnosis not present

## 2020-12-07 DIAGNOSIS — D271 Benign neoplasm of left ovary: Secondary | ICD-10-CM | POA: Diagnosis not present

## 2020-12-07 HISTORY — DX: Other specified conditions associated with female genital organs and menstrual cycle: N94.89

## 2020-12-07 HISTORY — DX: Anxiety disorder, unspecified: F41.9

## 2020-12-07 HISTORY — DX: Presence of spectacles and contact lenses: Z97.3

## 2020-12-07 HISTORY — DX: Nausea with vomiting, unspecified: Z98.890

## 2020-12-07 HISTORY — PX: ROBOTIC ASSISTED SALPINGO OOPHERECTOMY: SHX6082

## 2020-12-07 LAB — ABO/RH: ABO/RH(D): O POS

## 2020-12-07 LAB — TYPE AND SCREEN
ABO/RH(D): O POS
Antibody Screen: NEGATIVE

## 2020-12-07 SURGERY — SALPINGO-OOPHORECTOMY, ROBOT-ASSISTED
Anesthesia: General | Laterality: Bilateral

## 2020-12-07 MED ORDER — CEFAZOLIN SODIUM-DEXTROSE 2-4 GM/100ML-% IV SOLN
INTRAVENOUS | Status: AC
  Filled 2020-12-07: qty 100

## 2020-12-07 MED ORDER — ROCURONIUM BROMIDE 100 MG/10ML IV SOLN
INTRAVENOUS | Status: DC | PRN
  Administered 2020-12-07: 50 mg via INTRAVENOUS
  Administered 2020-12-07 (×2): 10 mg via INTRAVENOUS

## 2020-12-07 MED ORDER — EPHEDRINE 5 MG/ML INJ
INTRAVENOUS | Status: AC
  Filled 2020-12-07: qty 10

## 2020-12-07 MED ORDER — PROPOFOL 10 MG/ML IV BOLUS
INTRAVENOUS | Status: AC
  Filled 2020-12-07: qty 20

## 2020-12-07 MED ORDER — LIDOCAINE HCL (CARDIAC) PF 100 MG/5ML IV SOSY
PREFILLED_SYRINGE | INTRAVENOUS | Status: DC | PRN
  Administered 2020-12-07 (×2): 50 mg via INTRAVENOUS

## 2020-12-07 MED ORDER — OXYCODONE HCL 5 MG PO TABS: 5.0000 mg | ORAL_TABLET | Freq: Once | ORAL | Status: DC | PRN

## 2020-12-07 MED ORDER — MIDAZOLAM HCL 5 MG/5ML IJ SOLN
INTRAMUSCULAR | Status: DC | PRN
  Administered 2020-12-07: 2 mg via INTRAVENOUS

## 2020-12-07 MED ORDER — BUPIVACAINE HCL 0.25 % IJ SOLN
INTRAMUSCULAR | Status: DC | PRN
  Administered 2020-12-07: 22 mL

## 2020-12-07 MED ORDER — FENTANYL CITRATE (PF) 250 MCG/5ML IJ SOLN
INTRAMUSCULAR | Status: AC
  Filled 2020-12-07: qty 5

## 2020-12-07 MED ORDER — ROCURONIUM BROMIDE 10 MG/ML (PF) SYRINGE
PREFILLED_SYRINGE | INTRAVENOUS | Status: AC
  Filled 2020-12-07: qty 10

## 2020-12-07 MED ORDER — ONDANSETRON HCL 4 MG/2ML IJ SOLN
INTRAMUSCULAR | Status: DC | PRN
  Administered 2020-12-07: 4 mg via INTRAVENOUS

## 2020-12-07 MED ORDER — ACETAMINOPHEN 500 MG PO TABS
ORAL_TABLET | ORAL | Status: AC
  Filled 2020-12-07: qty 2

## 2020-12-07 MED ORDER — HYDROMORPHONE HCL 1 MG/ML IJ SOLN
0.2500 mg | INTRAMUSCULAR | Status: DC | PRN
  Administered 2020-12-07: 0.5 mg via INTRAVENOUS

## 2020-12-07 MED ORDER — PHENYLEPHRINE 40 MCG/ML (10ML) SYRINGE FOR IV PUSH (FOR BLOOD PRESSURE SUPPORT)
PREFILLED_SYRINGE | INTRAVENOUS | Status: AC
  Filled 2020-12-07: qty 10

## 2020-12-07 MED ORDER — KETOROLAC TROMETHAMINE 30 MG/ML IJ SOLN
INTRAMUSCULAR | Status: DC | PRN
  Administered 2020-12-07: 30 mg via INTRAVENOUS

## 2020-12-07 MED ORDER — FENTANYL CITRATE (PF) 100 MCG/2ML IJ SOLN
INTRAMUSCULAR | Status: DC | PRN
  Administered 2020-12-07 (×5): 50 ug via INTRAVENOUS

## 2020-12-07 MED ORDER — GABAPENTIN 100 MG PO CAPS
100.0000 mg | ORAL_CAPSULE | ORAL | Status: AC
  Administered 2020-12-07: 100 mg via ORAL

## 2020-12-07 MED ORDER — LIDOCAINE 2% (20 MG/ML) 5 ML SYRINGE
INTRAMUSCULAR | Status: AC
  Filled 2020-12-07: qty 5

## 2020-12-07 MED ORDER — SCOPOLAMINE 1 MG/3DAYS TD PT72
1.0000 | MEDICATED_PATCH | TRANSDERMAL | Status: DC
Start: 2020-12-07 — End: 2020-12-07
  Administered 2020-12-07: 1.5 mg via TRANSDERMAL

## 2020-12-07 MED ORDER — SUGAMMADEX SODIUM 200 MG/2ML IV SOLN
INTRAVENOUS | Status: DC | PRN
  Administered 2020-12-07: 200 mg via INTRAVENOUS

## 2020-12-07 MED ORDER — OXYCODONE HCL 5 MG/5ML PO SOLN: 5.0000 mg | Freq: Once | ORAL | Status: DC | PRN

## 2020-12-07 MED ORDER — KETOROLAC TROMETHAMINE 30 MG/ML IJ SOLN: 30.0000 mg | Freq: Once | INTRAMUSCULAR | Status: DC | PRN

## 2020-12-07 MED ORDER — PROMETHAZINE HCL 25 MG/ML IJ SOLN: 6.2500 mg | INTRAMUSCULAR | Status: DC | PRN

## 2020-12-07 MED ORDER — DEXAMETHASONE SODIUM PHOSPHATE 10 MG/ML IJ SOLN
INTRAMUSCULAR | Status: AC
  Filled 2020-12-07: qty 1

## 2020-12-07 MED ORDER — EPHEDRINE SULFATE 50 MG/ML IJ SOLN
INTRAMUSCULAR | Status: DC | PRN
  Administered 2020-12-07: 10 mg via INTRAVENOUS

## 2020-12-07 MED ORDER — KETOROLAC TROMETHAMINE 30 MG/ML IJ SOLN
INTRAMUSCULAR | Status: AC
  Filled 2020-12-07: qty 1

## 2020-12-07 MED ORDER — DEXAMETHASONE SODIUM PHOSPHATE 10 MG/ML IJ SOLN
4.0000 mg | INTRAMUSCULAR | Status: DC
Start: 2020-12-07 — End: 2020-12-07

## 2020-12-07 MED ORDER — HEPARIN SODIUM (PORCINE) 5000 UNIT/ML IJ SOLN
5000.0000 [IU] | INTRAMUSCULAR | Status: AC
Start: 2020-12-07 — End: 2020-12-07
  Administered 2020-12-07: 5000 [IU] via SUBCUTANEOUS

## 2020-12-07 MED ORDER — SODIUM CHLORIDE 0.9 % IR SOLN
Status: DC | PRN
  Administered 2020-12-07: 1000 mL

## 2020-12-07 MED ORDER — DEXAMETHASONE SODIUM PHOSPHATE 4 MG/ML IJ SOLN
INTRAMUSCULAR | Status: DC | PRN
  Administered 2020-12-07: 5 mg via INTRAVENOUS

## 2020-12-07 MED ORDER — GABAPENTIN 100 MG PO CAPS
ORAL_CAPSULE | ORAL | Status: AC
  Filled 2020-12-07: qty 1

## 2020-12-07 MED ORDER — MIDAZOLAM HCL 2 MG/2ML IJ SOLN
INTRAMUSCULAR | Status: AC
  Filled 2020-12-07: qty 2

## 2020-12-07 MED ORDER — ACETAMINOPHEN 500 MG PO TABS
1000.0000 mg | ORAL_TABLET | ORAL | Status: AC
  Administered 2020-12-07: 1000 mg via ORAL

## 2020-12-07 MED ORDER — MEPERIDINE HCL 25 MG/ML IJ SOLN: 6.2500 mg | INTRAMUSCULAR | Status: DC | PRN

## 2020-12-07 MED ORDER — PROPOFOL 10 MG/ML IV BOLUS
INTRAVENOUS | Status: DC | PRN
  Administered 2020-12-07: 100 mg via INTRAVENOUS

## 2020-12-07 MED ORDER — SCOPOLAMINE 1 MG/3DAYS TD PT72
MEDICATED_PATCH | TRANSDERMAL | Status: AC
  Filled 2020-12-07: qty 1

## 2020-12-07 MED ORDER — HEPARIN SODIUM (PORCINE) 5000 UNIT/ML IJ SOLN
INTRAMUSCULAR | Status: AC
  Filled 2020-12-07: qty 1

## 2020-12-07 MED ORDER — CEFAZOLIN SODIUM-DEXTROSE 2-4 GM/100ML-% IV SOLN
2.0000 g | INTRAVENOUS | Status: AC
Start: 2020-12-07 — End: 2020-12-07
  Administered 2020-12-07: 2 g via INTRAVENOUS

## 2020-12-07 MED ORDER — HYDROMORPHONE HCL 1 MG/ML IJ SOLN
INTRAMUSCULAR | Status: AC
  Filled 2020-12-07: qty 1

## 2020-12-07 MED ORDER — LACTATED RINGERS IV SOLN: INTRAVENOUS | Status: DC

## 2020-12-07 SURGICAL SUPPLY — 50 items
ADH SKN CLS APL DERMABOND .7 (GAUZE/BANDAGES/DRESSINGS) ×2
BAG LAPAROSCOPIC 12 15 PORT 16 (BASKET) ×1 IMPLANT
BAG RETRIEVAL 12/15 (BASKET) ×3
BAG SPEC RTRVL LRG 6X4 10 (ENDOMECHANICALS) ×2
BLADE SURG 10 STRL SS (BLADE) ×2 IMPLANT
COVER BACK TABLE 60X90IN (DRAPES) ×3 IMPLANT
COVER TIP SHEARS 8 DVNC (MISCELLANEOUS) ×2 IMPLANT
COVER TIP SHEARS 8MM DA VINCI (MISCELLANEOUS) ×3
DERMABOND ADVANCED (GAUZE/BANDAGES/DRESSINGS) ×1
DERMABOND ADVANCED .7 DNX12 (GAUZE/BANDAGES/DRESSINGS) ×2 IMPLANT
DRAPE ARM DVNC X/XI (DISPOSABLE) ×8 IMPLANT
DRAPE COLUMN DVNC XI (DISPOSABLE) ×2 IMPLANT
DRAPE DA VINCI XI ARM (DISPOSABLE) ×12
DRAPE DA VINCI XI COLUMN (DISPOSABLE) ×3
DRAPE SHEET LG 3/4 BI-LAMINATE (DRAPES) ×3 IMPLANT
DRAPE SURG IRRIG POUCH 19X23 (DRAPES) ×3 IMPLANT
DRSG OPSITE POSTOP 3X4 (GAUZE/BANDAGES/DRESSINGS) ×2 IMPLANT
ELECT REM PT RETURN 9FT ADLT (ELECTROSURGICAL) ×3
ELECTRODE REM PT RTRN 9FT ADLT (ELECTROSURGICAL) ×2 IMPLANT
GAUZE 4X4 16PLY RFD (DISPOSABLE) ×3 IMPLANT
GLOVE SURG ENC MOIS LTX SZ6 (GLOVE) ×14 IMPLANT
GLOVE SURG ENC MOIS LTX SZ6.5 (GLOVE) ×6 IMPLANT
IRRIG SUCT STRYKERFLOW 2 WTIP (MISCELLANEOUS) ×3
IRRIGATION SUCT STRKRFLW 2 WTP (MISCELLANEOUS) ×2 IMPLANT
KIT TURNOVER CYSTO (KITS) ×3 IMPLANT
LEGGING LITHOTOMY PAIR STRL (DRAPES) ×3 IMPLANT
MANIPULATOR UTERINE 4.5 ZUMI (MISCELLANEOUS) ×3 IMPLANT
NEEDLE HYPO 22GX1.5 SAFETY (NEEDLE) ×3 IMPLANT
OBTURATOR OPTICAL STANDARD 8MM (TROCAR) ×3
OBTURATOR OPTICAL STND 8 DVNC (TROCAR) ×2
OBTURATOR OPTICALSTD 8 DVNC (TROCAR) ×2 IMPLANT
PACK ROBOT GYN CUSTOM WL (TRAY / TRAY PROCEDURE) ×3 IMPLANT
PACK ROBOTIC GOWN (GOWN DISPOSABLE) ×3 IMPLANT
PAD POSITIONING PINK XL (MISCELLANEOUS) ×3 IMPLANT
PENCIL SMOKE EVACUATOR (MISCELLANEOUS) ×2 IMPLANT
PORT LAP GEL ALEXIS MED 5-9CM (MISCELLANEOUS) ×2 IMPLANT
POUCH SPECIMEN RETRIEVAL 10MM (ENDOMECHANICALS) ×2 IMPLANT
SEAL CANN UNIV 5-8 DVNC XI (MISCELLANEOUS) ×6 IMPLANT
SEAL XI 5MM-8MM UNIVERSAL (MISCELLANEOUS) ×9
SET TRI-LUMEN FLTR TB AIRSEAL (TUBING) ×3 IMPLANT
SUT MNCRL AB 4-0 PS2 18 (SUTURE) ×4 IMPLANT
SUT PDS AB 0 CTX 60 (SUTURE) ×2 IMPLANT
SUT VIC AB 2-0 SH 27 (SUTURE) ×6
SUT VIC AB 2-0 SH 27XBRD (SUTURE) ×2 IMPLANT
SUT VIC AB 4-0 PS2 18 (SUTURE) ×6 IMPLANT
SYR CONTROL 10ML LL (SYRINGE) ×4 IMPLANT
TRAP SPECIMEN MUCUS 40CC (MISCELLANEOUS) ×2 IMPLANT
TRAY FOLEY W/BAG SLVR 14FR LF (SET/KITS/TRAYS/PACK) ×3 IMPLANT
UNDERPAD 30X36 HEAVY ABSORB (UNDERPADS AND DIAPERS) ×3 IMPLANT
WATER STERILE IRR 1000ML POUR (IV SOLUTION) ×3 IMPLANT

## 2020-12-07 NOTE — Addendum Note (Signed)
Addendum  created 12/07/20 1504 by Pervis Hocking, DO   Intraprocedure Event edited, Intraprocedure Staff edited

## 2020-12-07 NOTE — Transfer of Care (Signed)
Immediate Anesthesia Transfer of Care Note  Patient: MIKELE SIFUENTES  Procedure(s) Performed: Procedure(s) (LRB): XI ROBOTIC ASSISTED BILATERAL SALPINGO OOPHORECTOMY, MINI LAPAROTOMY, PELVIC WASHINGS (Bilateral)  Patient Location: PACU  Anesthesia Type: General  Level of Consciousness: awake, sedated, patient cooperative and responds to stimulation  Airway & Oxygen Therapy: Patient Spontanous Breathing and Patient connected to [place] 02 and soft FM   Post-op Assessment: Report given to PACU RN, Post -op Vital signs reviewed and stable and Patient moving all extremities  Post vital signs: Reviewed and stable  Complications: No apparent anesthesia complications

## 2020-12-07 NOTE — Discharge Instructions (Addendum)
12/07/2020  Return to work: 4-6 weeks if applicable  Activity: 1. Be up and out of the bed during the day.  Take a nap if needed.  You may walk up steps but be careful and use the hand rail.  Stair climbing will tire you more than you think, you may need to stop part way and rest.   2. No lifting or straining for 6 weeks.  3. No driving for 1 week(s).  Do not drive if you are taking narcotic pain medicine.  4. Shower daily.  Use soap and water on your incision and pat dry; don't rub.  No tub baths until cleared by your surgeon.   5. No sexual activity and nothing in the vagina for 4  weeks.  6. You may experience a small amount of clear drainage from your incisions, which is normal.  If the drainage persists or increases, please call the office.  7. You may experience vaginal spotting after surgery.  The spotting is normal but if you experience heavy bleeding, call our office.  8. Take Tylenol or ibuprofen first for pain and only use narcotic pain medication for severe pain not relieved by the Tylenol or Ibuprofen.  Monitor your Tylenol intake to a max of 4,000 mg.  Diet: 1. Low sodium Heart Healthy Diet is recommended.  2. It is safe to use a laxative, such as Miralax or Colace, if you have difficulty moving your bowels. You can take Sennakot at bedtime every evening to keep bowel movements regular and to prevent constipation.    Wound Care: 1. Keep clean and dry.  Shower daily.  Reasons to call the Doctor:  Fever - Oral temperature greater than 100.4 degrees Fahrenheit  Foul-smelling vaginal discharge  Difficulty urinating  Nausea and vomiting  Increased pain at the site of the incision that is unrelieved with pain medicine.  Difficulty breathing with or without chest pain  New calf pain especially if only on one side  Sudden, continuing increased vaginal bleeding with or without clots.   Contacts: For questions or concerns you should contact:  Dr. Jeral Pinch  at [phone]  Joylene John, NP at [phone]  After Hours: call [phone] and have the GYN Oncologist paged/contacted    Post Anesthesia Home Care Instructions  Activity: Get plenty of rest for the remainder of the day. A responsible individual must stay with you for 24 hours following the procedure.  For the next 24 hours, DO NOT: -Drive a car -Paediatric nurse -Drink alcoholic beverages -Take any medication unless instructed by your physician -Make any legal decisions or sign important papers.  Meals: Start with liquid foods such as gelatin or soup. Progress to regular foods as tolerated. Avoid greasy, spicy, heavy foods. If nausea and/or vomiting occur, drink only clear liquids until the nausea and/or vomiting subsides. Call your physician if vomiting continues.  Special Instructions/Symptoms: Your throat may feel dry or sore from the anesthesia or the breathing tube placed in your throat during surgery. If this causes discomfort, gargle with warm salt water. The discomfort should disappear within 24 hours.  If you had a scopolamine patch placed behind your ear for the management of post- operative nausea and/or vomiting:  1. The medication in the patch is effective for 72 hours, after which it should be removed.  Wrap patch in a tissue and discard in the trash. Wash hands thoroughly with soap and water. 2. You may remove the patch earlier than 72 hours if you experience unpleasant side  effects which may include dry mouth, dizziness or visual disturbances. 3. Avoid touching the patch. Wash your hands with soap and water after contact with the patch.

## 2020-12-07 NOTE — Op Note (Signed)
OPERATIVE NOTE  Pre-operative Diagnosis: Pelvic mass  Post-operative Diagnosis: same, prior ligament fibroid clinically (benign mass on frozen section)  Operation: Robotic-assisted laparoscopic bilateral salpingoophorectomy, anterior uterine nodule excision, excision of right adnexal mass, enterolysis for approximately 40 minutes, oversewing of sigmoid serosa  Surgeon: Jeral Pinch MD  Assistant Surgeon: Joylene John NP  Anesthesia: GET  Urine Output: 300cc  Operative Findings:  On exam under anesthesia, mobile, 8-10 cm mid pelvic mass.  On intra-abdominal entry, minimal yellow-tinged ascites.  Normal upper abdominal survey including liver edge, diaphragm, stomach, and omentum.  Normal-appearing small and large bowel although sigmoid colon and mesentery adherent to the left adnexa, left pelvic sidewall, and rectum adherent to the posterior cervix.  Findings consistent with prior history of endometriosis.  Adjacent to the right fallopian tube and ovary, a smooth, white, 10 cm solid mass was noted.  Adnexa itself was normal-appearing as was the left adnexa.  Uterus measuring 6-8 cm in total, with several small, subserosal and intramural fibroids.  Sub-1 cm white nodule on the anterior fundus of the uterus.  No adenopathy.  No intra-abdominal or pelvic evidence of disease.  Estimated Blood Loss:  50cc      Total IV Fluids: see I&O flowsheet         Specimens:  bilateral tubes and ovaries, right adnexal mass, anterior uterine nodule         Complications:  None apparent; patient tolerated the procedure well.         Disposition: PACU - hemodynamically stable.  Procedure Details  The patient was seen in the Holding Room. The risks, benefits, complications, treatment options, and expected outcomes were discussed with the patient.  The patient concurred with the proposed plan, giving informed consent.  The site of surgery properly noted/marked. The patient was identified as Tracey Ayala and the procedure verified as a Robotic-assisted hysterectomy with bilateral salpingo oophorectomy with possible staging, mini-lap.   After induction of anesthesia, the patient was draped and prepped in the usual sterile manner. Patient was placed in supine position after anesthesia and draped and prepped in the usual sterile manner as follows: Her arms were tucked to her side with all appropriate precautions.  The shoulders were stabilized with padded shoulder blocks applied to the acromium processes.  The patient was placed in the semi-lithotomy position in [place].  The perineum and vagina were prepped with CholoraPrep. The patient was draped after the CholoraPrep had been allowed to dry for 3 minutes.  A Time Out was held and the above information confirmed.  The urethra was prepped with Betadine. Foley catheter was placed.  A sterile speculum was placed in the vagina.  OG tube placement was confirmed and to suction.   Next, a 10 mm skin incision was made 1 cm below the subcostal margin in the midclavicular line.  The 5 mm Optiview port and scope was used for direct entry.  Opening pressure was under 10 mm CO2.  The abdomen was insufflated and the findings were noted as above.   At this point and all points during the procedure, the patient's intra-abdominal pressure did not exceed 15 mmHg. Next, a 6cm skin incision was made superior to the umbilicus and a right and left port were placed about 8 cm lateral to the robot port on the right and left side.  A fourth arm was placed on the right.  The 5 mm assist trocar was exchanged for a 10-12 mm port.  The 6 cm supraumbilical incision  was extended and electrocautery was used to open the incision down to the peritoneum. The peritoneum was entered sharply and extended along the length of the incision.  The Alexis and laparoscopic System was then placed and a robotic trocar was passed through the System. A second right upper quadrant incision had  to be made to accommodate the trocar after the [facility] laparoscopic cap system was placed.  All ports were placed under direct visualization.  The patient was placed in steep Trendelenburg.  Bowel was already noted to be in the upper abdomen.  The robot was docked in the normal manner.  Pelvic washings were collected.  A less than 5 mm white nodule was noted on the anterior surface of the uterine fundus.  This was excised using monopolar electrocautery and sent for frozen section.  The right peritoneum was opened parallel to the IP ligament to open the retroperitoneal space. The round ligament was preserved. The ureter was noted to be on the medial leaf of the broad ligament.  The peritoneum above the ureter was incised and stretched and the infundibulopelvic ligament was skeletonized, cauterized and cut.  While holding the right adnexa, the mesosalpinx was cauterized and transected until just lateral to the uterine fundus.  The utero-ovarian and fallopian tube were then isolated, cauterized, and transected, freeing the right adnexa including the pelvic mass.  The camera was then moved to a lateral port and the robotic trocar in the laparoscopic cap system was removed.  A 15 mm spleen bag was then entered through the laparoscopic cap system and advanced into the pelvis.  The right adnexa was placed in the Endo Catch bag.  The camera and all robotic instruments were removed and the robot was undocked.  The patient was then flattened.  The laparoscopic cap system was then removed and the Endo Catch bag opened to prevent spillage of contents.  Morcellation was then performed to remove the right adnexa.  This was removed piecemeal and handed off the field to be sent for frozen section.  The laparoscopic cap was replaced on the Alexis and the abdomen was reinsufflated.  All ports were noted to be within the abdomen still.  The robot was redocked and instruments replaced into the abdominal cavity under direct  visualization.  Attention was then turned to the left.  The left peritoneum was opened parallel to the IP ligament to open the retroperitoneal space.  The round ligament was preserved.  The sigmoid colon was significantly adherent to the left fallopian tube, ovary, and pelvic sidewall.  Sharp dissection was used to carefully dissect the sigmoid colon free from the left adnexa.  The left ureter was identified along the medial aspect of the broad ligament.  Peritoneum of the ureter was incised, stretched, and infundibulopelvic ligament was skeletonized, cauterized, and cut.  The left adnexa was then elevated and continued careful dissection was used to free the sigmoid and sigmoid mesentery from the adnexa and pelvic sidewall.  Once sufficiently mobilized, the mesosalpinx was cauterized until just lateral to the uterus.  The fallopian tube and utero ovarian ligament were isolated, cauterized, and transected, freeing the left adnexa.  Left adnexa was placed in Endo Catch bag and removed through the assist trocar.  The sigmoid colon was carefully inspected.  There was an approximately 1 cm area where it appeared that there may have been some deserosalization of the sigmoid.  2 interrupted figure-of-eight sutures with 2-0 Vicryl was used to reinforce the sigmoid serosa.    At  this point, frozen section returned with both the anterior uterine nodule as well as the right adnexal mass both benign.  Irrigation was used and excellent hemostasis was achieved.  At this point in the procedure was completed.  Robotic instruments were removed under direct visulaization.  The robot was undocked. The fascia at the 10-12 mm port was closed with 0 Vicryl on a UR-5 needle.  The fascia of the minilaparotomy was closed with running 0 PDS, tied at the midline.  Subcutaneous tissue was irrigated, additional local anesthesia with Marcaine was injected.  The PDS knots were buried using a running stitch of 2-0 Vicryl.  The subcuticular  tissue of all incisions was closed with 4-0 Vicryl and the skin was closed with 4-0 Monocryl in a subcuticular manner.  Dermabond was applied.    The vagina was swabbed with  minimal bleeding noted.  Foley catheter was removed. All sponge, lap and needle counts were correct x  3.   The patient was transferred to the recovery room in stable condition.  Jeral Pinch, MD

## 2020-12-07 NOTE — Anesthesia Procedure Notes (Addendum)
Procedure Name: Intubation Date/Time: 12/07/2020 11:06 AM Performed by: Justice Rocher, CRNA Pre-anesthesia Checklist: Patient identified, Emergency Drugs available, Suction available, Patient being monitored and Timeout performed Patient Re-evaluated:Patient Re-evaluated prior to induction Oxygen Delivery Method: Circle system utilized Preoxygenation: Pre-oxygenation with 100% oxygen Induction Type: IV induction Ventilation: Mask ventilation without difficulty Laryngoscope Size: Mac and 3 Grade View: Grade III Tube type: Oral Tube size: 7.0 mm Number of attempts: 3 Airway Equipment and Method: Stylet and Bougie stylet Placement Confirmation: ETT inserted through vocal cords under direct vision,  positive ETCO2,  breath sounds checked- equal and bilateral and CO2 detector Secured at: 23 cm Tube secured with: Tape Dental Injury: Teeth and Oropharynx as per pre-operative assessment  Difficulty Due To: Difficult Airway- due to anterior larynx and Difficult Airway- due to large tongue Comments: 3 attempts, finally successful with bougie- large tongue, anterior airway. [place], DO

## 2020-12-07 NOTE — Brief Op Note (Signed)
12/07/2020  1:57 PM  PATIENT:  Tracey Ayala  70 y.o. female  PRE-OPERATIVE DIAGNOSIS:  ADNEXAL MASS  POST-OPERATIVE DIAGNOSIS:  ADNEXAL MASS  PROCEDURE:  Procedure(s): XI ROBOTIC ASSISTED BILATERAL SALPINGO OOPHORECTOMY, MINI LAPAROTOMY, PELVIC WASHINGS (Bilateral)  SURGEON:  Surgeon(s) and Role:    Lafonda Mosses, MD - Primary  ASSISTANTS: Joylene John NP   ANESTHESIA:   general  EBL:  50 mL   BLOOD ADMINISTERED:none  DRAINS: none   LOCAL MEDICATIONS USED:  MARCAINE     SPECIMEN:  Right adnexa (frozen), left tube and ovary, anterior uterine nodule (frozen)  DISPOSITION OF SPECIMEN:  PATHOLOGY  COUNTS:  YES  TOURNIQUET:  * No tourniquets in log *  DICTATION: .Note written in EPIC  PLAN OF CARE: Discharge to home after PACU  PATIENT DISPOSITION:  PACU - hemodynamically stable.   Delay start of Pharmacological VTE agent (>24hrs) due to surgical blood loss or risk of bleeding: not applicable

## 2020-12-07 NOTE — Anesthesia Postprocedure Evaluation (Signed)
Anesthesia Post Note  Patient: Tracey Ayala  Procedure(s) Performed: XI ROBOTIC ASSISTED BILATERAL SALPINGO OOPHORECTOMY, MINI LAPAROTOMY, PELVIC WASHINGS (Bilateral )     Patient location during evaluation: PACU Anesthesia Type: General Level of consciousness: awake and alert, oriented and patient cooperative Pain management: pain level controlled Vital Signs Assessment: post-procedure vital signs reviewed and stable Respiratory status: spontaneous breathing, nonlabored ventilation and respiratory function stable Cardiovascular status: blood pressure returned to baseline and stable Postop Assessment: no apparent nausea or vomiting Anesthetic complications: no   No complications documented.  Last Vitals:  Vitals:   12/07/20 0924 12/07/20 1400  BP: (!) 156/93 140/73  Pulse: 87 94  Resp: 14 16  Temp: 36.6 C (!) 36.4 C  SpO2: 98% 100%    Last Pain:  Vitals:   12/07/20 1400  TempSrc:   PainSc: [place]

## 2020-12-07 NOTE — Anesthesia Preprocedure Evaluation (Addendum)
Anesthesia Evaluation  Patient identified by MRN, date of birth, ID band Patient awake    Reviewed: Allergy & Precautions, NPO status , Patient's Chart, lab work & pertinent test results  History of Anesthesia Complications Negative for: history of anesthetic complications  Airway Mallampati: II  TM Distance: >3 FB Neck ROM: Full    Dental no notable dental hx.    Pulmonary neg pulmonary ROS,    Pulmonary exam normal breath sounds clear to auscultation       Cardiovascular hypertension, Pt. on medications Normal cardiovascular exam Rhythm:Regular Rate:Normal     Neuro/Psych PSYCHIATRIC DISORDERS Anxiety negative neurological ROS     GI/Hepatic negative GI ROS, Neg liver ROS,   Endo/Other  negative endocrine ROS  Renal/GU negative Renal ROS  Female GU complaint (adnexal mass)     Musculoskeletal negative musculoskeletal ROS (+)   Abdominal   Peds negative pediatric ROS (+)  Hematology negative hematology ROS (+) hct 39.3, plt 193   Anesthesia Other Findings   Reproductive/Obstetrics negative OB ROS                             Anesthesia Physical Anesthesia Plan  ASA: II  Anesthesia Plan: General   Post-op Pain Management:    Induction: Intravenous  PONV Risk Score and Plan: Ondansetron, Dexamethasone, Midazolam and Treatment may vary due to age or medical condition  Airway Management Planned: Oral ETT  Additional Equipment: None  Intra-op Plan:   Post-operative Plan: Extubation in OR  Informed Consent: I have reviewed the patients History and Physical, chart, labs and discussed the procedure including the risks, benefits and alternatives for the proposed anesthesia with the patient or authorized representative who has indicated his/her understanding and acceptance.     Dental advisory given  Plan Discussed with: CRNA  Anesthesia Plan Comments:          Anesthesia Quick Evaluation

## 2020-12-07 NOTE — Interval H&P Note (Signed)
History and Physical Interval Note:  12/07/2020 10:11 AM  Tracey Ayala  has presented today for surgery, with the diagnosis of ADNEXAL MASS.  The various methods of treatment have been discussed with the patient and family. After consideration of risks, benefits and other options for treatment, the patient has consented to  Procedure(s): XI ROBOTIC ASSISTED BILATERAL SALPINGO OOPHORECTOMY, MINI LAPAROTOMY, POSSIBLE LAPAROTOMY WITH STAGING (Bilateral) POSSIBLE XI ROBOTIC ASSISTED TOTAL HYSTERECTOMY (N/A) as a surgical intervention.  The patient's history has been reviewed, patient examined, no change in status, stable for surgery.  I have reviewed the patient's chart and labs.  Questions were answered to the patient's satisfaction.     Lafonda Mosses

## 2020-12-08 ENCOUNTER — Telehealth: Payer: Self-pay

## 2020-12-08 ENCOUNTER — Encounter (HOSPITAL_BASED_OUTPATIENT_CLINIC_OR_DEPARTMENT_OTHER): Payer: Self-pay | Admitting: Gynecologic Oncology

## 2020-12-08 LAB — CYTOLOGY - NON PAP

## 2020-12-08 NOTE — Telephone Encounter (Signed)
Tracey Ayala states that she is eating,drinking, and urinating well. She has not passed gas. She is to increase the senokot-s to 2 tabs bid with 8 oz of water. If no BM by Thursday midday, she is to add a capful of Miralax bid. Afebrile. Incisions D&I Pt to remove the honeycomb dressing on Saturday 12-12-20 Pt pain controled with Ibuprofen and tylenol at this time. Pt aware of post op appointments as well as the office number [phone] and after hours number [phone] to call if she has any questions or concerns

## 2020-12-09 ENCOUNTER — Telehealth: Payer: Self-pay | Admitting: *Deleted

## 2020-12-09 NOTE — Telephone Encounter (Signed)
Called Tracey Ayala back and discussed Philip's symptoms.  She said she vomited x 2 after drinking some ginger ale.  She was having a lot of pain in her stomach from gas or "old food" rolling around.  She is now rating the pain at a 5/10.  She is not nauseated now.  She thinks the pain is coming from not having a bowel movement yet. She denies having any trouble urinating. They are wondering if she can start taking the Miralax today and also how much she can strain to try to have a bowel movement.  She is also wondering if she should eat some prunes.  Advised her to eat a light diet like crackers and sip on fluids until she is sure she won't be nauseated.  Also that I will check on the Miralax and straining and call them back.

## 2020-12-09 NOTE — Telephone Encounter (Signed)
Called and talked to Ward.  She is feeling better but is still very sore.  She did have a bm this afternoon and said it helped.  Advised her to call if she experiences severe abdominal pain, fever or nausea/vomiting.  She verbalized understanding and agreement.

## 2020-12-09 NOTE — Telephone Encounter (Signed)
Called Tracey Ayala and advised her that [name] can start taking Miralax today and that she can strain a very small amount.  She does have stiches on her colon that we are concerned about and may be causing the pain. She can also try eating some prunes if they will not make her nauseated.  She verbalized agreement and understanding of instructions.

## 2020-12-09 NOTE — Telephone Encounter (Signed)
Patient's sister Tracey Ayala called and stated "Tracey Ayala had surgery on Monday and today has nausea and vomiting times two. The vomit was clear in color. She has been using the tylenol and ibuprofen for pain; no tramadol. She has no pain, fever or bleeding.She is passing gas and using the senokot since Monday, but no bowel movement." Explained that the message will be given to [facility] APP and the nurse and someone will call her back. (use patient's home number)

## 2020-12-10 LAB — SURGICAL PATHOLOGY

## 2020-12-14 DIAGNOSIS — J301 Allergic rhinitis due to pollen: Secondary | ICD-10-CM | POA: Diagnosis not present

## 2020-12-14 DIAGNOSIS — J3089 Other allergic rhinitis: Secondary | ICD-10-CM | POA: Diagnosis not present

## 2020-12-15 ENCOUNTER — Encounter: Payer: Self-pay | Admitting: Gynecologic Oncology

## 2020-12-15 ENCOUNTER — Inpatient Hospital Stay: Payer: Medicare HMO | Attending: Gynecologic Oncology | Admitting: Gynecologic Oncology

## 2020-12-15 DIAGNOSIS — D270 Benign neoplasm of right ovary: Secondary | ICD-10-CM

## 2020-12-15 DIAGNOSIS — N809 Endometriosis, unspecified: Secondary | ICD-10-CM

## 2020-12-15 DIAGNOSIS — Z90722 Acquired absence of ovaries, bilateral: Secondary | ICD-10-CM

## 2020-12-15 HISTORY — DX: Benign neoplasm of right ovary: D27.0

## 2020-12-15 NOTE — Progress Notes (Signed)
Gynecologic Oncology Telehealth Consult Note: Gyn-Onc  I connected with Tracey Ayala on 12/15/20 at  4:00 PM EDT by telephone and verified that I am speaking with the correct person using two identifiers.  I discussed the limitations, risks, security and privacy concerns of performing an evaluation and management service by telemedicine and the availability of in-person appointments. I also discussed with the patient that there may be a patient responsible charge related to this service. The patient expressed understanding and agreed to proceed.  Other persons participating in the visit and their role in the encounter: none.  Patient's location: home Provider's location: [facility]  Reason for Visit: f/u surgery, discussion of pathology  Interval History: Patient is status post robotic assisted BSO, excision of anterior uterine nodule, excision of right adnexal mass, enterolysis and oversewing of the sigmoid colon for findings of a right solid ovarian mass.  Surgery was performed on 4/25.  Final pathology was consistent with benign disease process.  Patient reports she is doing much better over the last couple of days.  Her bowel function is now almost back to normal.  She endorses at least 1 bowel movement a day.  She is taking Senokot still twice in the evening.  She denies any urinary symptoms.  She denies any vaginal bleeding or discharge, fevers or chills.  She is tolerating a regular diet although had less of an appetite yesterday.  She denies any nausea or emesis.  She removed her honeycomb dressing on Sunday and has noted a little bit more soreness on the right than the left.  She was out of the house doing errands and going to a medical appointment yesterday and so feels a little bit more sore today.  Past Medical/Surgical History: Past Medical History:  Diagnosis Date  . Adnexal mass   . Allergy    Receives injections once a week  . Anxiety   . Basal cell carcinoma 03/04/2013   bcc  right nostril =mohs  . Endometriosis   . Hypercholesterolemia   . Hypertension   . Wears glasses     Past Surgical History:  Procedure Laterality Date  . basal cell carcinoma from left shoulder  2018  . mohs procedure  2014   right nostril  . ovarian cystectomy and appendectomy Left 1978  . ROBOTIC ASSISTED SALPINGO OOPHERECTOMY Bilateral 12/07/2020   Procedure: XI ROBOTIC ASSISTED BILATERAL SALPINGO OOPHORECTOMY, MINI LAPAROTOMY, PELVIC WASHINGS;  Surgeon: Lafonda Mosses, MD;  Location: [facility];  Service: Gynecology;  Laterality: Bilateral;  . WISDOM TOOTH EXTRACTION  1974    Family History  Problem Relation Age of Onset  . Cancer Sister        carcinoid tumor of the appendix  . Colon cancer Maternal Aunt   . Breast cancer Maternal Aunt   . Endometrial cancer Neg Hx   . Ovarian cancer Neg Hx   . Pancreatic cancer Neg Hx   . Prostate cancer Neg Hx     Social History   Socioeconomic History  . Marital status: Widowed    Spouse name: Not on file  . Number of children: Not on file  . Years of education: Not on file  . Highest education level: Not on file  Occupational History  . Occupation: retired  Tobacco Use  . Smoking status: Never Smoker  . Smokeless tobacco: Never Used  Vaping Use  . Vaping Use: Never used  Substance and Sexual Activity  . Alcohol use: Never  . Drug use: Never  .  Sexual activity: Not Currently  Other Topics Concern  . Not on file  Social History Narrative  . Not on file   Social Determinants of Health   Financial Resource Strain: Not on file  Food Insecurity: Not on file  Transportation Needs: Not on file  Physical Activity: Not on file  Stress: Not on file  Social Connections: Not on file    Current Medications:  Current Outpatient Medications:  .  Cholecalciferol (VITAMIN D) 50 MCG (2000 UT) CAPS, Take 2,000 Units by mouth daily. Vitamin d 3, Disp: , Rfl:  .  citalopram (CELEXA) 20 MG tablet, Take 20 mg  by mouth every evening., Disp: , Rfl:  .  clonazePAM (KLONOPIN) 0.5 MG tablet, Take 0.5 mg by mouth at bedtime., Disp: , Rfl:  .  EPINEPHrine 0.3 mg/0.3 mL IJ SOAJ injection, Inject 0.3 mg into the muscle as needed for anaphylaxis., Disp: , Rfl:  .  ibuprofen (ADVIL) 600 MG tablet, Take 1 tablet (600 mg total) by mouth every 6 (six) hours as needed for moderate pain. For AFTER surgery, Disp: 30 tablet, Rfl: 0 .  lisinopril (ZESTRIL) 10 MG tablet, Take 10 mg by mouth daily., Disp: , Rfl:  .  Menaquinone-7 (VITAMIN K2 PO), Take 1 tablet by mouth daily., Disp: , Rfl:  .  Omega-3 Fatty Acids (FISH OIL) 1000 MG CAPS, Take 1 capsule by mouth daily., Disp: , Rfl:  .  PRESCRIPTION MEDICATION, Allergy Injections once a week., Disp: , Rfl:  .  senna-docusate (SENOKOT-S) 8.6-50 MG tablet, Take 2 tablets by mouth at bedtime. For AFTER surgery, do not take if having diarrhea, Disp: 30 tablet, Rfl: 0 .  traMADol (ULTRAM) 50 MG tablet, Take 1 tablet (50 mg total) by mouth every 6 (six) hours as needed for severe pain. For AFTER surgery, do not take and drive, Disp: 15 tablet, Rfl: 0 .  vitamin C (ASCORBIC ACID) 500 MG tablet, Take 500 mg by mouth daily., Disp: , Rfl:  .  Zinc 25 MG TABS, Take 25 mg by mouth 3 (three) times a week., Disp: , Rfl:   Review of Symptoms: And positives as per HPI.  Physical Exam: There were no vitals taken for this visit. Deferred given limitations of phone visit.  Laboratory & Radiologic Studies: A. 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 ADNEXA, RIGHT, RESECTION:  - Benign ovarian fibroma  - Benign ovary and fallopian tube  - No malignancy identified  - See comment   C. OVARY AND FALLOPIAN TUBE, LEFT, SALPINGO-OOPHORECTOMY:  - Benign ovary and fallopian tube  - No malignancy identified   Assessment & Plan: Tracey Ayala is a 70 y.o. woman approximately 1wk s/p Robotic BSO and enterolysis for a right adnexal mass with  pathology revealing of benign ovarian fibroma whose surgery was complicated by her prior history of endometriosis with significant pelvic/colon adhesions.  Patient seems to be doing well and is meeting postoperative milestones.  I discussed in detail findings at the time of surgery including enterolysis and oversewing of her colon.  We discussed continued expectations from a postoperative standpoint as well as activity restrictions.  I also reviewed with her pathology from surgery.  She had not had a chance to review pathology results on MyChart.  She is very happy with this news.  She is aware of her follow-up appointment with me in clinic on 5/16 and knows to call the clinic if anything comes up prior to that date.  I discussed the assessment and treatment plan with the patient. The patient was provided with an opportunity to ask questions and all were answered. The patient agreed with the plan and demonstrated an understanding of the instructions.   The patient was advised to call back or see an in-person evaluation if the symptoms worsen or if the condition fails to improve as anticipated.   22 minutes of total time was spent for this patient encounter, including preparation, face-to-face counseling with the patient and coordination of care, and documentation of the encounter.   Jeral Pinch, MD  Division of Gynecologic Oncology  Department of Obstetrics and Gynecology  [facility]

## 2020-12-18 ENCOUNTER — Telehealth: Payer: Self-pay | Admitting: *Deleted

## 2020-12-18 NOTE — Telephone Encounter (Signed)
Patient called and stated "I started bleeding this morning. It is bright red blood and I'm wearing a pad; but the blood is only when I urinate and wipe. There is some blood in the toilet. I have no cramping, pain, N/V or fever." Explained that someone will call her back.

## 2020-12-18 NOTE — Telephone Encounter (Signed)
Called [place] regarding vaginal bleeding.  She said it started this morning around 11:00.  She noticed after urinating and did see some bright red blood in the commode and when wiping.  She said it is not a large amount.  Asked if she thought the blood was in her urine and she said she thinks it is vaginal bleeding.  She denies having any dysuria or frequency.  Advised her that some vaginal spotting is normal.  If she start to have a large, gushing amount of vaginal bleeding, she should go to the ER.  Advised we will check on her Monday.

## 2020-12-25 ENCOUNTER — Encounter: Payer: Self-pay | Admitting: Gynecologic Oncology

## 2020-12-27 NOTE — Progress Notes (Signed)
Gynecologic Oncology Return Clinic Visit  12/28/20  Reason for Visit: post-op follow-up  Treatment History: Patient is status post robotic assisted BSO, excision of anterior uterine nodule, excision of right adnexal mass, enterolysis and oversewing of the sigmoid colon for findings of a right solid ovarian mass.  Surgery was performed on 4/25.  Final pathology was consistent with benign disease process.  Interval History: The patient presents today for follow-up after recent surgery.  She notes overall doing quite well with continued improvement daily.  She endorses a good appetite without any nausea or vomiting.  She reports her bowel function has returned to normal.  She denies any urinary symptoms.  She denies any significant abdominal or pelvic pain and is not using any pain medication.  She denies any vaginal bleeding or discharge.  Past Medical/Surgical History: Past Medical History:  Diagnosis Date  . Adnexal mass   . Allergy    Receives injections once a week  . Anxiety   . Basal cell carcinoma 03/04/2013   bcc right nostril =mohs  . Endometriosis   . 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 Hypercholesterolemia   . Hypertension   . Wears glasses     Past Surgical History:  Procedure Laterality Date  . basal cell carcinoma from left shoulder  2018  . mohs procedure  2014   right nostril  . ovarian cystectomy and appendectomy Left 1978  . ROBOTIC ASSISTED SALPINGO OOPHERECTOMY Bilateral 12/07/2020   Procedure: XI ROBOTIC ASSISTED BILATERAL SALPINGO OOPHORECTOMY, MINI LAPAROTOMY, PELVIC WASHINGS;  Surgeon: Lafonda Mosses, MD;  Location: [facility];  Service: Gynecology;  Laterality: Bilateral;  . WISDOM TOOTH EXTRACTION  1974    Family History  Problem Relation Age of Onset  . Cancer Sister        carcinoid tumor of the appendix  . Colon cancer Maternal Aunt   . Breast cancer Maternal Aunt   . Endometrial cancer Neg Hx   . Ovarian cancer Neg Hx   .  Pancreatic cancer Neg Hx   . Prostate cancer Neg Hx     Social History   Socioeconomic History  . Marital status: Widowed    Spouse name: Not on file  . Number of children: Not on file  . Years of education: Not on file  . Highest education level: Not on file  Occupational History  . Occupation: retired  Tobacco Use  . Smoking status: Never Smoker  . Smokeless tobacco: Never Used  Vaping Use  . Vaping Use: Never used  Substance and Sexual Activity  . Alcohol use: Never  . Drug use: Never  . Sexual activity: Not Currently  Other Topics Concern  . Not on file  Social History Narrative  . Not on file   Social Determinants of Health   Financial Resource Strain: Not on file  Food Insecurity: Not on file  Transportation Needs: Not on file  Physical Activity: Not on file  Stress: Not on file  Social Connections: Not on file    Current Medications:  Current Outpatient Medications:  .  Cholecalciferol (VITAMIN D) 50 MCG (2000 UT) CAPS, Take 2,000 Units by mouth daily. Vitamin d 3, Disp: , Rfl:  .  citalopram (CELEXA) 20 MG tablet, Take 20 mg by mouth every evening., Disp: , Rfl:  .  clonazePAM (KLONOPIN) 0.5 MG tablet, Take 0.5 mg by mouth at bedtime., Disp: , Rfl:  .  EPINEPHrine 0.3 mg/0.3 mL IJ SOAJ injection, Inject 0.3 mg into the muscle  as needed for anaphylaxis., Disp: , Rfl:  .  ibuprofen (ADVIL) 600 MG tablet, Take 1 tablet (600 mg total) by mouth every 6 (six) hours as needed for moderate pain. For AFTER surgery, Disp: 30 tablet, Rfl: 0 .  lisinopril (ZESTRIL) 10 MG tablet, Take 10 mg by mouth daily., Disp: , Rfl:  .  Menaquinone-7 (VITAMIN K2 PO), Take 1 tablet by mouth daily., Disp: , Rfl:  .  Omega-3 Fatty Acids (FISH OIL) 1000 MG CAPS, Take 1 capsule by mouth daily., Disp: , Rfl:  .  vitamin C (ASCORBIC ACID) 500 MG tablet, Take 500 mg by mouth daily., Disp: , Rfl:  .  Zinc 25 MG TABS, Take 25 mg by mouth 3 (three) times a week., Disp: , Rfl:  .  PRESCRIPTION  MEDICATION, Allergy Injections once a week., Disp: , Rfl:  .  senna-docusate (SENOKOT-S) 8.6-50 MG tablet, Take 2 tablets by mouth at bedtime. For AFTER surgery, do not take if having diarrhea (Patient not taking: Reported on 12/25/2020), Disp: 30 tablet, Rfl: 0 .  traMADol (ULTRAM) 50 MG tablet, Take 1 tablet (50 mg total) by mouth every 6 (six) hours as needed for severe pain. For AFTER surgery, do not take and drive (Patient not taking: Reported on 12/25/2020), Disp: 15 tablet, Rfl: 0  Review of Systems: Denies appetite changes, fevers, chills, fatigue, unexplained weight changes. Denies hearing loss, neck lumps or masses, mouth sores, ringing in ears or voice changes. Denies cough or wheezing.  Denies shortness of breath. Denies chest pain or palpitations. Denies leg swelling. Denies abdominal distention, pain, blood in stools, constipation, diarrhea, nausea, vomiting, or early satiety. Denies pain with intercourse, dysuria, frequency, hematuria or incontinence. Denies hot flashes, pelvic pain, vaginal bleeding or vaginal discharge.   Denies joint pain, back pain or muscle pain/cramps. Denies itching, rash, or wounds. Denies dizziness, headaches, numbness or seizures. Denies swollen lymph nodes or glands, denies easy bruising or bleeding. Denies anxiety, depression, confusion, or decreased concentration.  Physical Exam: BP (!) 146/87 (BP Location: Right Arm, Patient Position: Sitting)   Pulse 79   Temp 98.8 F (37.1 C)   Resp 17   Ht 5' 2.5" (1.588 m)   Wt 109 lb 12.8 oz (49.8 kg)   SpO2 100%   BMI 19.76 kg/m  General: Alert, oriented, no acute distress. HEENT: Atraumatic, normocephalic, sclera anicteric. Chest: Unlabored breathing on room air. Abdomen: soft, nontender.  Normoactive bowel sounds.  No masses or hepatosplenomegaly appreciated.  Well-healing laparoscopic incisions and mini laparotomy.  Remaining Dermabond removed.  Part of the honeycomb dressing (clear part) was still  in place.  This was removed with some mild erythema noted over the areas where adhesive had been. Extremities: Grossly normal range of motion.  Warm, well perfused.  No edema bilaterally. Skin: No rashes or lesions noted.  Laboratory & Radiologic Studies: None new  Assessment & Plan: Tracey Ayala is a 70 y.o. woman s/p Robotic BSO and enterolysis for a right adnexal mass with pathology revealing of benign ovarian fibroma whose surgery was complicated by her prior history of endometriosis with significant pelvic/colon adhesions.  The patient is doing well and meeting postoperative milestones.  We discussed continued restrictions and postoperative expectations.  In terms of her pathology, she was given a paper copy of her final pathology and we discussed the results.  I also reviewed in detail findings at the time of surgery related to her endometriosis and why I made the decision not to proceed with hysterectomy.  In terms of her gynecologic care, we discussed that she does not need Pap test screening.  If she were to have any vaginal bleeding, discharge, or other pelvic symptoms, she would want to see a gynecologist.  In terms of her other female care, such as mammogram screening, this could be done through her primary care provider or gynecologist.  Patient asked for recommendations for a local gynecologist and I gave her a couple today.  She was given the office phone numbers and knows that she can call our office if she would like a referral placed to either practice.  28 minutes of total time was spent for this patient encounter, including preparation, face-to-face counseling with the patient and coordination of care, and documentation of the encounter.  Jeral Pinch, MD  Division of Gynecologic Oncology  Department of Obstetrics and Gynecology  [facility]

## 2020-12-28 ENCOUNTER — Encounter: Payer: Self-pay | Admitting: Gynecologic Oncology

## 2020-12-28 ENCOUNTER — Other Ambulatory Visit: Payer: Self-pay

## 2020-12-28 ENCOUNTER — Inpatient Hospital Stay (HOSPITAL_BASED_OUTPATIENT_CLINIC_OR_DEPARTMENT_OTHER): Payer: Medicare HMO | Admitting: Gynecologic Oncology

## 2020-12-28 VITALS — BP 146/87 | HR 79 | Temp 98.8°F | Resp 17 | Ht 62.5 in | Wt 109.8 lb

## 2020-12-28 DIAGNOSIS — D270 Benign neoplasm of right ovary: Secondary | ICD-10-CM

## 2020-12-28 DIAGNOSIS — Z90722 Acquired absence of ovaries, bilateral: Secondary | ICD-10-CM

## 2020-12-28 DIAGNOSIS — N9489 Other specified conditions associated with female genital organs and menstrual cycle: Secondary | ICD-10-CM

## 2020-12-28 DIAGNOSIS — J3089 Other allergic rhinitis: Secondary | ICD-10-CM | POA: Diagnosis not present

## 2020-12-28 DIAGNOSIS — J301 Allergic rhinitis due to pollen: Secondary | ICD-10-CM | POA: Diagnosis not present

## 2020-12-28 NOTE — Patient Instructions (Signed)
It was good to see you today!  You are healing very well from surgery.  Remember no lifting more than 10-15 pounds until you are 6 weeks out from surgery.  Otherwise, you can continue to increase your activity as you feel able to.  In terms of gynecologic care, I would recommend either: Dr. Sabra Heck at [facility] [place] [address]. Lynnette Caffey [facility] [address] [facility] [phone]

## 2021-01-04 ENCOUNTER — Ambulatory Visit: Payer: Medicare HMO | Admitting: Gynecologic Oncology

## 2021-01-04 DIAGNOSIS — J3089 Other allergic rhinitis: Secondary | ICD-10-CM | POA: Diagnosis not present

## 2021-01-04 DIAGNOSIS — J301 Allergic rhinitis due to pollen: Secondary | ICD-10-CM | POA: Diagnosis not present

## 2021-01-12 DIAGNOSIS — J3089 Other allergic rhinitis: Secondary | ICD-10-CM | POA: Diagnosis not present

## 2021-01-12 DIAGNOSIS — J301 Allergic rhinitis due to pollen: Secondary | ICD-10-CM | POA: Diagnosis not present

## 2021-01-18 DIAGNOSIS — J3089 Other allergic rhinitis: Secondary | ICD-10-CM | POA: Diagnosis not present

## 2021-01-18 DIAGNOSIS — J301 Allergic rhinitis due to pollen: Secondary | ICD-10-CM | POA: Diagnosis not present

## 2021-01-19 ENCOUNTER — Encounter: Payer: Medicare HMO | Admitting: Gynecologic Oncology

## 2021-01-25 DIAGNOSIS — H2512 Age-related nuclear cataract, left eye: Secondary | ICD-10-CM | POA: Diagnosis not present

## 2021-01-25 DIAGNOSIS — H52202 Unspecified astigmatism, left eye: Secondary | ICD-10-CM | POA: Diagnosis not present

## 2021-01-26 DIAGNOSIS — H2511 Age-related nuclear cataract, right eye: Secondary | ICD-10-CM | POA: Diagnosis not present

## 2021-02-02 DIAGNOSIS — J3089 Other allergic rhinitis: Secondary | ICD-10-CM | POA: Diagnosis not present

## 2021-02-02 DIAGNOSIS — J301 Allergic rhinitis due to pollen: Secondary | ICD-10-CM | POA: Diagnosis not present

## 2021-02-08 DIAGNOSIS — H2511 Age-related nuclear cataract, right eye: Secondary | ICD-10-CM | POA: Diagnosis not present

## 2021-02-08 DIAGNOSIS — H52201 Unspecified astigmatism, right eye: Secondary | ICD-10-CM | POA: Diagnosis not present

## 2021-02-10 DIAGNOSIS — J301 Allergic rhinitis due to pollen: Secondary | ICD-10-CM | POA: Diagnosis not present

## 2021-02-10 DIAGNOSIS — J3089 Other allergic rhinitis: Secondary | ICD-10-CM | POA: Diagnosis not present

## 2021-02-16 DIAGNOSIS — J301 Allergic rhinitis due to pollen: Secondary | ICD-10-CM | POA: Diagnosis not present

## 2021-02-16 DIAGNOSIS — J3089 Other allergic rhinitis: Secondary | ICD-10-CM | POA: Diagnosis not present

## 2021-02-22 DIAGNOSIS — J3089 Other allergic rhinitis: Secondary | ICD-10-CM | POA: Diagnosis not present

## 2021-02-22 DIAGNOSIS — J301 Allergic rhinitis due to pollen: Secondary | ICD-10-CM | POA: Diagnosis not present

## 2021-02-24 DIAGNOSIS — Z1231 Encounter for screening mammogram for malignant neoplasm of breast: Secondary | ICD-10-CM | POA: Diagnosis not present

## 2021-02-24 DIAGNOSIS — M85851 Other specified disorders of bone density and structure, right thigh: Secondary | ICD-10-CM | POA: Diagnosis not present

## 2021-02-24 DIAGNOSIS — M85852 Other specified disorders of bone density and structure, left thigh: Secondary | ICD-10-CM | POA: Diagnosis not present

## 2021-02-24 DIAGNOSIS — Z780 Asymptomatic menopausal state: Secondary | ICD-10-CM | POA: Diagnosis not present

## 2021-03-02 DIAGNOSIS — J3089 Other allergic rhinitis: Secondary | ICD-10-CM | POA: Diagnosis not present

## 2021-03-02 DIAGNOSIS — J301 Allergic rhinitis due to pollen: Secondary | ICD-10-CM | POA: Diagnosis not present

## 2021-03-08 DIAGNOSIS — J3089 Other allergic rhinitis: Secondary | ICD-10-CM | POA: Diagnosis not present

## 2021-03-08 DIAGNOSIS — J301 Allergic rhinitis due to pollen: Secondary | ICD-10-CM | POA: Diagnosis not present

## 2021-03-16 DIAGNOSIS — J301 Allergic rhinitis due to pollen: Secondary | ICD-10-CM | POA: Diagnosis not present

## 2021-03-16 DIAGNOSIS — J3089 Other allergic rhinitis: Secondary | ICD-10-CM | POA: Diagnosis not present

## 2021-03-22 DIAGNOSIS — H1131 Conjunctival hemorrhage, right eye: Secondary | ICD-10-CM | POA: Diagnosis not present

## 2021-03-23 DIAGNOSIS — J301 Allergic rhinitis due to pollen: Secondary | ICD-10-CM | POA: Diagnosis not present

## 2021-03-23 DIAGNOSIS — J3089 Other allergic rhinitis: Secondary | ICD-10-CM | POA: Diagnosis not present

## 2021-03-30 DIAGNOSIS — J301 Allergic rhinitis due to pollen: Secondary | ICD-10-CM | POA: Diagnosis not present

## 2021-03-30 DIAGNOSIS — J3089 Other allergic rhinitis: Secondary | ICD-10-CM | POA: Diagnosis not present

## 2021-04-06 DIAGNOSIS — J3089 Other allergic rhinitis: Secondary | ICD-10-CM | POA: Diagnosis not present

## 2021-04-06 DIAGNOSIS — J301 Allergic rhinitis due to pollen: Secondary | ICD-10-CM | POA: Diagnosis not present

## 2021-04-13 DIAGNOSIS — J3089 Other allergic rhinitis: Secondary | ICD-10-CM | POA: Diagnosis not present

## 2021-04-13 DIAGNOSIS — J301 Allergic rhinitis due to pollen: Secondary | ICD-10-CM | POA: Diagnosis not present

## 2021-04-20 DIAGNOSIS — J3089 Other allergic rhinitis: Secondary | ICD-10-CM | POA: Diagnosis not present

## 2021-04-20 DIAGNOSIS — J301 Allergic rhinitis due to pollen: Secondary | ICD-10-CM | POA: Diagnosis not present

## 2021-05-04 DIAGNOSIS — J3089 Other allergic rhinitis: Secondary | ICD-10-CM | POA: Diagnosis not present

## 2021-05-04 DIAGNOSIS — J301 Allergic rhinitis due to pollen: Secondary | ICD-10-CM | POA: Diagnosis not present

## 2021-05-18 DIAGNOSIS — J301 Allergic rhinitis due to pollen: Secondary | ICD-10-CM | POA: Diagnosis not present

## 2021-05-18 DIAGNOSIS — J3089 Other allergic rhinitis: Secondary | ICD-10-CM | POA: Diagnosis not present

## 2021-05-24 DIAGNOSIS — R69 Illness, unspecified: Secondary | ICD-10-CM | POA: Diagnosis not present

## 2021-05-24 DIAGNOSIS — E785 Hyperlipidemia, unspecified: Secondary | ICD-10-CM | POA: Diagnosis not present

## 2021-05-24 DIAGNOSIS — I10 Essential (primary) hypertension: Secondary | ICD-10-CM | POA: Diagnosis not present

## 2021-05-24 DIAGNOSIS — G4700 Insomnia, unspecified: Secondary | ICD-10-CM | POA: Diagnosis not present

## 2021-05-25 DIAGNOSIS — Z681 Body mass index (BMI) 19 or less, adult: Secondary | ICD-10-CM | POA: Diagnosis not present

## 2021-05-25 DIAGNOSIS — Z124 Encounter for screening for malignant neoplasm of cervix: Secondary | ICD-10-CM | POA: Diagnosis not present

## 2021-06-01 DIAGNOSIS — J3089 Other allergic rhinitis: Secondary | ICD-10-CM | POA: Diagnosis not present

## 2021-06-01 DIAGNOSIS — J301 Allergic rhinitis due to pollen: Secondary | ICD-10-CM | POA: Diagnosis not present

## 2021-06-08 DIAGNOSIS — J301 Allergic rhinitis due to pollen: Secondary | ICD-10-CM | POA: Diagnosis not present

## 2021-06-08 DIAGNOSIS — J3089 Other allergic rhinitis: Secondary | ICD-10-CM | POA: Diagnosis not present

## 2021-06-11 DIAGNOSIS — H26492 Other secondary cataract, left eye: Secondary | ICD-10-CM | POA: Diagnosis not present

## 2021-06-11 DIAGNOSIS — Z961 Presence of intraocular lens: Secondary | ICD-10-CM | POA: Diagnosis not present

## 2021-06-11 DIAGNOSIS — H02831 Dermatochalasis of right upper eyelid: Secondary | ICD-10-CM | POA: Diagnosis not present

## 2021-06-11 DIAGNOSIS — H18413 Arcus senilis, bilateral: Secondary | ICD-10-CM | POA: Diagnosis not present

## 2021-06-11 DIAGNOSIS — H26493 Other secondary cataract, bilateral: Secondary | ICD-10-CM | POA: Diagnosis not present

## 2021-06-15 DIAGNOSIS — J301 Allergic rhinitis due to pollen: Secondary | ICD-10-CM | POA: Diagnosis not present

## 2021-06-15 DIAGNOSIS — J3089 Other allergic rhinitis: Secondary | ICD-10-CM | POA: Diagnosis not present

## 2021-06-22 DIAGNOSIS — J301 Allergic rhinitis due to pollen: Secondary | ICD-10-CM | POA: Diagnosis not present

## 2021-06-22 DIAGNOSIS — J3089 Other allergic rhinitis: Secondary | ICD-10-CM | POA: Diagnosis not present

## 2021-06-22 DIAGNOSIS — H1045 Other chronic allergic conjunctivitis: Secondary | ICD-10-CM | POA: Diagnosis not present

## 2021-06-29 DIAGNOSIS — J301 Allergic rhinitis due to pollen: Secondary | ICD-10-CM | POA: Diagnosis not present

## 2021-06-29 DIAGNOSIS — J3089 Other allergic rhinitis: Secondary | ICD-10-CM | POA: Diagnosis not present

## 2021-07-06 DIAGNOSIS — J3089 Other allergic rhinitis: Secondary | ICD-10-CM | POA: Diagnosis not present

## 2021-07-06 DIAGNOSIS — J301 Allergic rhinitis due to pollen: Secondary | ICD-10-CM | POA: Diagnosis not present

## 2021-07-12 DIAGNOSIS — J301 Allergic rhinitis due to pollen: Secondary | ICD-10-CM | POA: Diagnosis not present

## 2021-07-12 DIAGNOSIS — J3089 Other allergic rhinitis: Secondary | ICD-10-CM | POA: Diagnosis not present

## 2021-07-13 DIAGNOSIS — J301 Allergic rhinitis due to pollen: Secondary | ICD-10-CM | POA: Diagnosis not present

## 2021-07-13 DIAGNOSIS — J3089 Other allergic rhinitis: Secondary | ICD-10-CM | POA: Diagnosis not present

## 2021-07-20 ENCOUNTER — Ambulatory Visit: Payer: Medicare HMO | Admitting: Dermatology

## 2021-07-20 ENCOUNTER — Other Ambulatory Visit: Payer: Self-pay

## 2021-07-20 DIAGNOSIS — Z1283 Encounter for screening for malignant neoplasm of skin: Secondary | ICD-10-CM

## 2021-07-20 DIAGNOSIS — L821 Other seborrheic keratosis: Secondary | ICD-10-CM | POA: Diagnosis not present

## 2021-07-20 DIAGNOSIS — D1801 Hemangioma of skin and subcutaneous tissue: Secondary | ICD-10-CM | POA: Diagnosis not present

## 2021-07-20 DIAGNOSIS — C44311 Basal cell carcinoma of skin of nose: Secondary | ICD-10-CM

## 2021-07-20 DIAGNOSIS — J301 Allergic rhinitis due to pollen: Secondary | ICD-10-CM | POA: Diagnosis not present

## 2021-07-20 DIAGNOSIS — J3089 Other allergic rhinitis: Secondary | ICD-10-CM | POA: Diagnosis not present

## 2021-07-20 DIAGNOSIS — D485 Neoplasm of uncertain behavior of skin: Secondary | ICD-10-CM

## 2021-07-20 DIAGNOSIS — Z85828 Personal history of other malignant neoplasm of skin: Secondary | ICD-10-CM

## 2021-07-20 NOTE — Patient Instructions (Signed)

## 2021-07-27 ENCOUNTER — Telehealth: Payer: Self-pay | Admitting: *Deleted

## 2021-07-27 DIAGNOSIS — J301 Allergic rhinitis due to pollen: Secondary | ICD-10-CM | POA: Diagnosis not present

## 2021-07-27 DIAGNOSIS — J3089 Other allergic rhinitis: Secondary | ICD-10-CM | POA: Diagnosis not present

## 2021-07-27 NOTE — Telephone Encounter (Signed)
Patient left message on office voice mail saying that she was returning phone call about pathology results.

## 2021-07-27 NOTE — Telephone Encounter (Signed)
Left message for patient to call us back for results.

## 2021-07-27 NOTE — Telephone Encounter (Signed)
-----   Message from Lavonna Monarch, MD sent at 07/27/2021  5:43 AM EST ----- Schedule Mohs

## 2021-07-27 NOTE — Telephone Encounter (Signed)
Patient is returning call.  I told patient it would be tomorrow before anyone called her back.  Please call [phone] (patient's cell number).

## 2021-07-28 NOTE — Telephone Encounter (Signed)
Pathology to patient-referral sent to [facility] for treatment.

## 2021-07-30 ENCOUNTER — Ambulatory Visit (INDEPENDENT_AMBULATORY_CARE_PROVIDER_SITE_OTHER): Payer: Medicare HMO | Admitting: Family Medicine

## 2021-07-30 ENCOUNTER — Encounter: Payer: Self-pay | Admitting: Family Medicine

## 2021-07-30 VITALS — BP 120/72 | HR 71 | Temp 97.5°F | Ht 62.5 in | Wt 108.0 lb

## 2021-07-30 DIAGNOSIS — G4700 Insomnia, unspecified: Secondary | ICD-10-CM

## 2021-07-30 DIAGNOSIS — R69 Illness, unspecified: Secondary | ICD-10-CM | POA: Diagnosis not present

## 2021-07-30 DIAGNOSIS — I10 Essential (primary) hypertension: Secondary | ICD-10-CM

## 2021-07-30 DIAGNOSIS — F419 Anxiety disorder, unspecified: Secondary | ICD-10-CM | POA: Diagnosis not present

## 2021-07-30 DIAGNOSIS — E785 Hyperlipidemia, unspecified: Secondary | ICD-10-CM

## 2021-07-30 LAB — LIPID PANEL
Cholesterol: 196 mg/dL (ref 0–200)
HDL: 97.6 mg/dL (ref >39.00)
LDL Cholesterol: 86 mg/dL (ref 0–99)
NonHDL: 98
Total CHOL/HDL Ratio: 2
Triglycerides: 60 mg/dL (ref 0.0–149.0)
VLDL: 12 mg/dL (ref 0.0–40.0)

## 2021-07-30 LAB — COMPREHENSIVE METABOLIC PANEL
ALT: 20 U/L (ref 0–35)
AST: 25 U/L (ref 0–37)
Albumin: 4.4 g/dL (ref 3.5–5.2)
Alkaline Phosphatase: 63 U/L (ref 39–117)
BUN: 12 mg/dL (ref 6–23)
CO2: 33 mEq/L — ABNORMAL HIGH (ref 19–32)
Calcium: 9.6 mg/dL (ref 8.4–10.5)
Chloride: 102 mEq/L (ref 96–112)
Creatinine, Ser: 0.7 mg/dL (ref 0.40–1.20)
GFR: 87.91 mL/min (ref >60.00)
Glucose, Bld: 90 mg/dL (ref 70–99)
Potassium: 4 mEq/L (ref 3.5–5.1)
Sodium: 142 mEq/L (ref 135–145)
Total Bilirubin: 0.8 mg/dL (ref 0.2–1.2)
Total Protein: 6.5 g/dL (ref 6.0–8.3)

## 2021-07-30 MED ORDER — HYDROXYZINE HCL 10 MG PO TABS: 5.0000 mg | ORAL_TABLET | Freq: Every evening | ORAL | 0 refills | Status: DC | PRN

## 2021-07-30 NOTE — Patient Instructions (Addendum)
You can try low dose hydroxyzine in place of klonopin at night.ok to remain on klonopin if needed. If hydroxyzine is effective, slowly taper off the klonopin - do not stop abruptly. Let me know if there are questions.  See other info for insomnia below. Recheck in 3 months.   If any concerns on labs will let you know.   I do recommend flu vaccine - we can give that here if you change your mind.   Thanks for coming in today.  Let me know if any med refills needed prior to next visit.     Insomnia Insomnia is a sleep disorder that makes it difficult to fall asleep or stay asleep. Insomnia can cause fatigue, low energy, difficulty concentrating, mood swings, and poor performance at work or school. There are three different ways to classify insomnia: Difficulty falling asleep. Difficulty staying asleep. Waking up too early in the morning. Any type of insomnia can be long-term (chronic) or short-term (acute). Both are common. Short-term insomnia usually lasts for three months or less. Chronic insomnia occurs at least three times a week for longer than three months. What are the causes? Insomnia may be caused by another condition, situation, or substance, such as: Anxiety. Certain medicines. Gastroesophageal reflux disease (GERD) or other gastrointestinal conditions. Asthma or other breathing conditions. Restless legs syndrome, sleep apnea, or other sleep disorders. Chronic pain. Menopause. Stroke. Abuse of alcohol, tobacco, or illegal drugs. Mental health conditions, such as depression. Caffeine. Neurological disorders, such as Alzheimer's disease. An overactive thyroid (hyperthyroidism). Sometimes, the cause of insomnia may not be known. What increases the risk? Risk factors for insomnia include: Gender. Women are affected more often than men. Age. Insomnia is more common as you get older. Stress. Lack of exercise. Irregular work schedule or working night shifts. Traveling between  different time zones. Certain medical and mental health conditions. What are the signs or symptoms? If you have insomnia, the main symptom is having trouble falling asleep or having trouble staying asleep. This may lead to other symptoms, such as: Feeling fatigued or having low energy. Feeling nervous about going to sleep. Not feeling rested in the morning. Having trouble concentrating. Feeling irritable, anxious, or depressed. How is this diagnosed? This condition may be diagnosed based on: Your symptoms and medical history. Your health care provider may ask about: Your sleep habits. Any medical conditions you have. Your mental health. A physical exam. How is this treated? Treatment for insomnia depends on the cause. Treatment may focus on treating an underlying condition that is causing insomnia. Treatment may also include: Medicines to help you sleep. Counseling or therapy. Lifestyle adjustments to help you sleep better. Follow these instructions at home: Eating and drinking  Limit or avoid alcohol, caffeinated beverages, and cigarettes, especially close to bedtime. These can disrupt your sleep. Do not eat a large meal or eat spicy foods right before bedtime. This can lead to digestive discomfort that can make it hard for you to sleep. Sleep habits  Keep a sleep diary to help you and your health care provider figure out what could be causing your insomnia. Write down: When you sleep. When you wake up during the night. How well you sleep. How rested you feel the next day. Any side effects of medicines you are taking. What you eat and drink. Make your bedroom a dark, comfortable place where it is easy to fall asleep. Put up shades or blackout curtains to block light from outside. Use a white noise machine  to block noise. Keep the temperature cool. Limit screen use before bedtime. This includes: Watching TV. Using your smartphone, tablet, or computer. Stick to a routine that  includes going to bed and waking up at the same times every day and night. This can help you fall asleep faster. Consider making a quiet activity, such as reading, part of your nighttime routine. Try to avoid taking naps during the day so that you sleep better at night. Get out of bed if you are still awake after 15 minutes of trying to sleep. Keep the lights down, but try reading or doing a quiet activity. When you feel sleepy, go back to bed. General instructions Take over-the-counter and prescription medicines only as told by your health care provider. Exercise regularly, as told by your health care provider. Avoid exercise starting several hours before bedtime. Use relaxation techniques to manage stress. Ask your health care provider to suggest some techniques that may work well for you. These may include: Breathing exercises. Routines to release muscle tension. Visualizing peaceful scenes. Make sure that you drive carefully. Avoid driving if you feel very sleepy. Keep all follow-up visits as told by your health care provider. This is important. Contact a health care provider if: You are tired throughout the day. You have trouble in your daily routine due to sleepiness. You continue to have sleep problems, or your sleep problems get worse. Get help right away if: You have serious thoughts about hurting yourself or someone else. If you ever feel like you may hurt yourself or others, or have thoughts about taking your own life, get help right away. You can go to your nearest emergency department or call: Your local emergency services (911 in the [place] A suicide crisis helpline, such as the [facility] at 401-196-6359 or 988 in the [place]. This is open 24 hours a day. Summary Insomnia is a sleep disorder that makes it difficult to fall asleep or stay asleep. Insomnia can be long-term (chronic) or short-term (acute). Treatment for insomnia depends on the cause.  Treatment may focus on treating an underlying condition that is causing insomnia. Keep a sleep diary to help you and your health care provider figure out what could be causing your insomnia. This information is not intended to replace advice given to you by your health care provider. Make sure you discuss any questions you have with your health care provider. Document Revised: 02/24/2021 Document Reviewed: 06/11/2020 Elsevier Patient Education  2022 [employer].

## 2021-07-30 NOTE — Progress Notes (Signed)
Subjective:  Patient ID: Tracey Ayala, female    DOB: February 19, 1951  Age: 70 y.o. MRN: [id number]  CC:  Chief Complaint  Patient presents with   New Patient (Initial Visit)    HPI Tracey Ayala presents for  New patient to establish care.  Previous patient of [facility] - Dr. Rex Kras, then Dr. Zadie Rhine - established with other provider to continue chronic klonopin.  Last labs in March.  Fasting today.   She has been under the care of of Dr. Fredderick Phenix with allergy/immunology for allergies/allergic rhinitis.  Dr. Denna Haggard with dermatology for seborrheic keratosis, cherry angioma, basal cell on nose - plan for MOHS.  ongoing skin monitoring.  Ophthalmology Dr. Talbert Forest - cataracts, Dr. Luretha Rued Underwent robotic BSO and enterolysis for right adnexal mass in April, benign ovarian fibroma.  Prior history of endometriosis, pelvic/colon adhesions.  Local gynecology recommendations were provided by [facility] (Dr. Berline Lopes) in May.  OBGYN - Dr. Lynnette Caffey, mammogram, BMD in July at [place]. Osteopenia. Takes vit D supplement. Calcium in diet, magnesium supplement with weight bearing exercises.    Hyperlipidemia: Lipitor 10 mg daily, fish oil/omega-3 fatty acids.  No myalgias, side effects.  No FH of CAD.  Lab Results  Component Value Date   CHOL 196 07/30/2021   HDL 97.60 07/30/2021   LDLCALC 86 07/30/2021   TRIG 60.0 07/30/2021   CHOLHDL 2 07/30/2021   Lab Results  Component Value Date   ALT 20 07/30/2021   AST 25 07/30/2021   ALKPHOS 63 07/30/2021   BILITOT 0.8 07/30/2021   Hypertension: Treated with lisinopril 10 mg daily. No new side effects, no cough.  Home readings:normal. 120/70.  BP Readings from Last 3 Encounters:  07/30/21 120/72  12/28/20 (!) 146/87  12/07/20 125/75   Lab Results  Component Value Date   CREATININE 0.70 07/30/2021   Anxiety Treated with Celexa 20 mg daily - originally for insomnia after menopause. No otc med relief. Helped for awhile, then less  relief. Usual difficulty with sleep onset. Started on Klonopin 0.5 mg as needed starting 12 years ago - initially 2-3 days per week. Ultimately required every night. Has been effective, no increased dose needed, no daytime somnolence. Not sure she has tried hydroxyzine.  No relief with trazodone.  Controlled substance database (PDMP) reviewed. No concerns appreciated. Last filled 07/15/21 - #30.  Counseling in past - declines at this time. Staying busy.  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   Depression screen [facility] 2/9 07/30/2021  Decreased Interest 0  Down, Depressed, Hopeless 0  PHQ - 2 Score 0  Altered sleeping 1  Tired, decreased energy 0  Change in appetite 0  Feeling bad or failure about yourself  0  Trouble concentrating 0  Moving slowly or fidgety/restless 0  Suicidal thoughts 0  PHQ-9 Score 1  Difficult doing work/chores Not difficult at all   HM: No flu vaccine this year. Has tolerated in past. declines Decided against covid vaccine.  Masking around crowds.  Vaseline for irritated areas on outside of ears - has d/w dermatology.   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   Past Medical History:  Diagnosis Date   Adnexal mass    Allergy    Receives injections  once a week   Anxiety    Basal cell carcinoma 03/04/2013   bcc right nostril =mohs   Cataract    Endometriosis    Fibroma of right ovary 12/15/2020   Hypercholesterolemia    Hypertension    Wears glasses    Past Surgical History:  Procedure Laterality Date   basal cell carcinoma from left shoulder  2018   mohs procedure  2014   right nostril   ovarian cystectomy and appendectomy Left 1978   ROBOTIC ASSISTED SALPINGO OOPHERECTOMY Bilateral  12/07/2020   Procedure: XI ROBOTIC ASSISTED BILATERAL SALPINGO OOPHORECTOMY, MINI LAPAROTOMY, PELVIC WASHINGS;  Surgeon: Lafonda Mosses, MD;  Location: [place];  Service: Gynecology;  Laterality: Bilateral;   WISDOM TOOTH EXTRACTION  1974   Allergies  Allergen Reactions   Codeine Nausea Only    Sick  Other reaction(s): GI upset nausea   Fosamax [Alendronate] Rash   Prior to Admission medications   Medication Sig Start Date End Date Taking? Authorizing Provider  atorvastatin (LIPITOR) 10 MG tablet atorvastatin 10 mg tablet   10 mg every day by oral route. 01/13/21  Yes [provider]  Cholecalciferol (VITAMIN D) 50 MCG (2000 UT) CAPS Take 2,000 Units by mouth daily. Vitamin d 3   Yes [provider]  citalopram (CELEXA) 20 MG tablet Take 20 mg by mouth every evening. 06/05/20  Yes [provider]  clonazePAM (KLONOPIN) 0.5 MG tablet clonazepam 0.5 mg tablet   1 tablet every day by oral route.   Yes [provider]  EPINEPHrine 0.3 mg/0.3 mL IJ SOAJ injection Inject 0.3 mg into the muscle as needed for anaphylaxis.   Yes [provider]  lisinopril (ZESTRIL) 10 MG tablet Take 10 mg by mouth daily. 06/02/20  Yes [provider]  Menaquinone-7 (VITAMIN K2 PO) Take 1 tablet by mouth daily.   Yes [provider]  Omega-3 Fatty Acids (FISH OIL) 1000 MG CAPS Take 1 capsule by mouth daily.   Yes [provider]  PRESCRIPTION MEDICATION Allergy Injections once a week.   Yes [provider]  vitamin C (ASCORBIC ACID) 500 MG tablet Take 500 mg by mouth daily.   Yes [provider]  Zinc 25 MG TABS Take 25 mg by mouth 3 (three) times a week.   Yes [provider]   Social History   Socioeconomic History   Marital status: Widowed    Spouse name: Not on file   Number of children: Not on file   Years of education: Not on file   Highest education level: Not on file  Occupational  History   Occupation: retired  Tobacco Use   Smoking status: Never   Smokeless tobacco: Never  Vaping Use   Vaping Use: Never used  Substance and Sexual Activity   Alcohol use: Never   Drug use: Never   Sexual activity: Not Currently  Other Topics Concern   Not on file  Social History Narrative   Not on file   Social Determinants of Health   Financial Resource Strain: Not on file  Food Insecurity: Not on file  Transportation Needs: Not on file  Physical Activity: Not on file  Stress: Not on file  Social Connections: Not on file  Intimate Partner Violence: Not on file    Review of Systems  Constitutional:  Negative for fatigue and unexpected weight change.  Respiratory:  Negative for chest tightness and shortness of breath.   Cardiovascular:  Negative for chest  pain, palpitations and leg swelling.  Gastrointestinal:  Negative for abdominal pain and blood in stool.  Neurological:  Negative for dizziness, syncope, light-headedness and headaches.    Objective:   Vitals:   07/30/21 0911  BP: 120/72  Pulse: 71  Temp: (!) 97.5 F (36.4 C)  TempSrc: Temporal  SpO2: 95%  Weight: 108 lb (49 kg)  Height: 5' 2.5" (1.588 m)     Physical Exam Vitals reviewed.  Constitutional:      Appearance: Normal appearance. She is well-developed.  HENT:     Head: Normocephalic and atraumatic.     Ears:     Comments: Min erythema outside ears. No rash, wounds otherwise.  Eyes:     Conjunctiva/sclera: Conjunctivae normal.     Pupils: Pupils are equal, round, and reactive to light.  Neck:     Vascular: No carotid bruit.  Cardiovascular:     Rate and Rhythm: Normal rate and regular rhythm.     Heart sounds: Normal heart sounds.  Pulmonary:     Effort: Pulmonary effort is normal.     Breath sounds: Normal breath sounds.  Abdominal:     Palpations: Abdomen is soft. There is no pulsatile mass.     Tenderness: There is no abdominal tenderness.  Musculoskeletal:     Right lower  leg: No edema.     Left lower leg: No edema.  Skin:    General: Skin is warm and dry.  Neurological:     Mental Status: She is alert and oriented to person, place, and time.  Psychiatric:        Mood and Affect: Mood normal.        Behavior: Behavior normal.        Thought Content: Thought content normal.    74 minutes spent during visit, including chart review, counseling and assimilation of information, exam, discussion of plan, and chart completion.    Assessment & Plan:  Tracey Ayala is a 70 y.o. female . Essential hypertension - Plan: Comprehensive metabolic panel  -Stable, check labs.  No med changes at this time.  Refills if needed.  Recheck next 3 months.  Hyperlipidemia, unspecified hyperlipidemia type - Plan: Lipid panel, Comprehensive metabolic panel  -Check updated labs, tolerating current regimen, continue statin, omega-3 fatty acids.  Anxiety Insomnia, unspecified type - Plan: hydrOXYzine (ATARAX) 10 MG tablet  -Anxiety overall stable.  Continue Celexa.  Persistent insomnia, need for nightly benzodiazepine.  Potential risks and side effects of meds discussed but has been on for some time.  Option of low-dose hydroxyzine and if that is effective can wean off clonazepam.  Potential side effects discussed.  Recheck next 3 months.  Handout on sleep hygiene, as well as counseling for management of anxiety/insomnia symptoms.  Meds ordered this encounter  Medications   hydrOXYzine (ATARAX) 10 MG tablet    Sig: Take 0.5-1 tablets (5-10 mg total) by mouth at bedtime as needed for anxiety.    Dispense:  30 tablet    Refill:  0   Patient Instructions  You can try low dose hydroxyzine in place of klonopin at night.ok to remain on klonopin if needed. If hydroxyzine is effective, slowly taper off the klonopin - do not stop abruptly. Let me know if there are questions.  See other info for insomnia below. Recheck in 3 months.   If any concerns on labs will let you know.   I  do recommend flu vaccine - we can give that here if you  change your mind.   Thanks for coming in today.  Let me know if any med refills needed prior to next visit.     Insomnia Insomnia is a sleep disorder that makes it difficult to fall asleep or stay asleep. Insomnia can cause fatigue, low energy, difficulty concentrating, mood swings, and poor performance at work or school. There are three different ways to classify insomnia: Difficulty falling asleep. Difficulty staying asleep. Waking up too early in the morning. Any type of insomnia can be long-term (chronic) or short-term (acute). Both are common. Short-term insomnia usually lasts for three months or less. Chronic insomnia occurs at least three times a week for longer than three months. What are the causes? Insomnia may be caused by another condition, situation, or substance, such as: Anxiety. Certain medicines. Gastroesophageal reflux disease (GERD) or other gastrointestinal conditions. Asthma or other breathing conditions. Restless legs syndrome, sleep apnea, or other sleep disorders. Chronic pain. Menopause. Stroke. Abuse of alcohol, tobacco, or illegal drugs. Mental health conditions, such as depression. Caffeine. Neurological disorders, such as Alzheimer's disease. An overactive thyroid (hyperthyroidism). Sometimes, the cause of insomnia may not be known. What increases the risk? Risk factors for insomnia include: Gender. Women are affected more often than men. Age. Insomnia is more common as you get older. Stress. Lack of exercise. Irregular work schedule or working night shifts. Traveling between different time zones. Certain medical and mental health conditions. What are the signs or symptoms? If you have insomnia, the main symptom is having trouble falling asleep or having trouble staying asleep. This may lead to other symptoms, such as: Feeling fatigued or having low energy. Feeling nervous about going to  sleep. Not feeling rested in the morning. Having trouble concentrating. Feeling irritable, anxious, or depressed. How is this diagnosed? This condition may be diagnosed based on: Your symptoms and medical history. Your health care provider may ask about: Your sleep habits. Any medical conditions you have. Your mental health. A physical exam. How is this treated? Treatment for insomnia depends on the cause. Treatment may focus on treating an underlying condition that is causing insomnia. Treatment may also include: Medicines to help you sleep. Counseling or therapy. Lifestyle adjustments to help you sleep better. Follow these instructions at home: Eating and drinking  Limit or avoid alcohol, caffeinated beverages, and cigarettes, especially close to bedtime. These can disrupt your sleep. Do not eat a large meal or eat spicy foods right before bedtime. This can lead to digestive discomfort that can make it hard for you to sleep. Sleep habits  Keep a sleep diary to help you and your health care provider figure out what could be causing your insomnia. Write down: When you sleep. When you wake up during the night. How well you sleep. How rested you feel the next day. Any side effects of medicines you are taking. What you eat and drink. Make your bedroom a dark, comfortable place where it is easy to fall asleep. Put up shades or blackout curtains to block light from outside. Use a white noise machine to block noise. Keep the temperature cool. Limit screen use before bedtime. This includes: Watching TV. Using your smartphone, tablet, or computer. Stick to a routine that includes going to bed and waking up at the same times every day and night. This can help you fall asleep faster. Consider making a quiet activity, such as reading, part of your nighttime routine. Try to avoid taking naps during the day so that you sleep better at  night. Get out of bed if you are still awake after 15  minutes of trying to sleep. Keep the lights down, but try reading or doing a quiet activity. When you feel sleepy, go back to bed. General instructions Take over-the-counter and prescription medicines only as told by your health care provider. Exercise regularly, as told by your health care provider. Avoid exercise starting several hours before bedtime. Use relaxation techniques to manage stress. Ask your health care provider to suggest some techniques that may work well for you. These may include: Breathing exercises. Routines to release muscle tension. Visualizing peaceful scenes. Make sure that you drive carefully. Avoid driving if you feel very sleepy. Keep all follow-up visits as told by your health care provider. This is important. Contact a health care provider if: You are tired throughout the day. You have trouble in your daily routine due to sleepiness. You continue to have sleep problems, or your sleep problems get worse. Get help right away if: You have serious thoughts about hurting yourself or someone else. If you ever feel like you may hurt yourself or others, or have thoughts about taking your own life, get help right away. You can go to your nearest emergency department or call: Your local emergency services (911 in the [place] A suicide crisis helpline, such as the [facility] at 270 519 0166 or 988 in the [place]. This is open 24 hours a day. Summary Insomnia is a sleep disorder that makes it difficult to fall asleep or stay asleep. Insomnia can be long-term (chronic) or short-term (acute). Treatment for insomnia depends on the cause. Treatment may focus on treating an underlying condition that is causing insomnia. Keep a sleep diary to help you and your health care provider figure out what could be causing your insomnia. This information is not intended to replace advice given to you by your health care provider. Make sure you discuss any questions you  have with your health care provider. Document Revised: 02/24/2021 Document Reviewed: 06/11/2020 Elsevier Patient Education  2022 [place],   Tracey Ray, MD [place], [facility] 07/31/21 10:15 AM

## 2021-08-03 DIAGNOSIS — J301 Allergic rhinitis due to pollen: Secondary | ICD-10-CM | POA: Diagnosis not present

## 2021-08-03 DIAGNOSIS — J3089 Other allergic rhinitis: Secondary | ICD-10-CM | POA: Diagnosis not present

## 2021-08-08 ENCOUNTER — Encounter: Payer: Self-pay | Admitting: Dermatology

## 2021-08-08 NOTE — Progress Notes (Signed)
° °  Follow-Up Visit   Subjective  Tracey Ayala is a 70 y.o. female who presents for the following: Annual Exam (Here for annual skin exam. Concerns red places on each ear. Also tiny red lesion on side of nose. Has been there since last visit. Patient wants rechecked. Also one lesion on patients chest. Rough feeling. History of non mole skin cancers. ).  Normal skin examination, several areas of concern particularly left nostril Location:  Duration:  Quality:  Associated Signs/Symptoms: Modifying Factors:  Severity:  Timing: Context:   Objective  Well appearing patient in no apparent distress; mood and affect are within normal limits. General skin examination, no atypical pigmented lesions.  1 probable basal cell carcinoma nose will be biopsied.  Left Shoulder - Anterior, Left Zygomatic Area, Right Breast, Torso - Posterior (Back) Multiple 4 to 6 mm tan and brown textured flattopped papules with compatible dermoscopy  Abdomen (Lower Torso, Anterior), Left Thigh - Anterior, Right Abdomen (side) - Upper, Right Dorsal Mid 3rd Finger, Right Lower Leg - Posterior, Right Thigh - Anterior Multiple 1 to 3 mm smooth red dermal papules  Left Ala Nasi Centrally eroded endophytic, 7 mm lesion         A full examination was performed including scalp, head, eyes, ears, nose, lips, neck, chest, axillae, abdomen, back, buttocks, bilateral upper extremities, bilateral lower extremities, hands, feet, fingers, toes, fingernails, and toenails. All findings within normal limits unless otherwise noted below.  Areas beneath undergarments not fully examined.   Assessment & Plan    Screening exam for skin cancer  Annual skin examination, encouraged to self examine twice annually.  Seborrheic keratosis (4) Left Shoulder - Anterior; Right Breast; Torso - Posterior (Back); Left Zygomatic Area  Benign, ok to leave if stable  Cherry angioma (6) Left Thigh - Anterior; Right Thigh - Anterior;  Right Lower Leg - Posterior; Abdomen (Lower Torso, Anterior); Right Abdomen (side) - Upper; Right Dorsal Mid 3rd Finger  No intervention necessary  Neoplasm of uncertain behavior of skin Left Ala Nasi  Skin / nail biopsy Type of biopsy: tangential   Informed consent: discussed and consent obtained   Timeout: patient name, date of birth, surgical site, and procedure verified   Anesthesia: the lesion was anesthetized in a standard fashion   Anesthetic:  1% lidocaine w/ epinephrine 1-100,000 local infiltration Instrument used: flexible razor blade   Hemostasis achieved with: ferric subsulfate   Outcome: patient tolerated procedure well   Post-procedure details: wound care instructions given    Specimen 1 - Surgical pathology Differential Diagnosis: bcc vs scc  Check Margins: No  Although this is a small lesion, the ill-defined margins may prompt referral for Mohs surgery.      I, Lavonna Monarch, MD, have reviewed all documentation for this visit.  The documentation on 08/08/21 for the exam, diagnosis, procedures, and orders are all accurate and complete.

## 2021-08-10 DIAGNOSIS — J301 Allergic rhinitis due to pollen: Secondary | ICD-10-CM | POA: Diagnosis not present

## 2021-08-10 DIAGNOSIS — J3089 Other allergic rhinitis: Secondary | ICD-10-CM | POA: Diagnosis not present

## 2021-08-16 ENCOUNTER — Encounter: Payer: Self-pay | Admitting: Family Medicine

## 2021-08-17 DIAGNOSIS — J3089 Other allergic rhinitis: Secondary | ICD-10-CM | POA: Diagnosis not present

## 2021-08-17 DIAGNOSIS — J301 Allergic rhinitis due to pollen: Secondary | ICD-10-CM | POA: Diagnosis not present

## 2021-08-21 ENCOUNTER — Other Ambulatory Visit: Payer: Self-pay | Admitting: Family Medicine

## 2021-08-21 DIAGNOSIS — G4700 Insomnia, unspecified: Secondary | ICD-10-CM

## 2021-08-24 DIAGNOSIS — J3089 Other allergic rhinitis: Secondary | ICD-10-CM | POA: Diagnosis not present

## 2021-08-24 DIAGNOSIS — J301 Allergic rhinitis due to pollen: Secondary | ICD-10-CM | POA: Diagnosis not present

## 2021-09-01 DIAGNOSIS — J301 Allergic rhinitis due to pollen: Secondary | ICD-10-CM | POA: Diagnosis not present

## 2021-09-01 DIAGNOSIS — J3089 Other allergic rhinitis: Secondary | ICD-10-CM | POA: Diagnosis not present

## 2021-09-08 DIAGNOSIS — J301 Allergic rhinitis due to pollen: Secondary | ICD-10-CM | POA: Diagnosis not present

## 2021-09-08 DIAGNOSIS — J3089 Other allergic rhinitis: Secondary | ICD-10-CM | POA: Diagnosis not present

## 2021-09-15 DIAGNOSIS — J3089 Other allergic rhinitis: Secondary | ICD-10-CM | POA: Diagnosis not present

## 2021-09-15 DIAGNOSIS — J301 Allergic rhinitis due to pollen: Secondary | ICD-10-CM | POA: Diagnosis not present

## 2021-09-16 DIAGNOSIS — C44311 Basal cell carcinoma of skin of nose: Secondary | ICD-10-CM | POA: Diagnosis not present

## 2021-09-16 DIAGNOSIS — C4491 Basal cell carcinoma of skin, unspecified: Secondary | ICD-10-CM | POA: Diagnosis not present

## 2021-09-22 DIAGNOSIS — J301 Allergic rhinitis due to pollen: Secondary | ICD-10-CM | POA: Diagnosis not present

## 2021-09-22 DIAGNOSIS — J3089 Other allergic rhinitis: Secondary | ICD-10-CM | POA: Diagnosis not present

## 2021-09-29 DIAGNOSIS — J301 Allergic rhinitis due to pollen: Secondary | ICD-10-CM | POA: Diagnosis not present

## 2021-09-29 DIAGNOSIS — J3089 Other allergic rhinitis: Secondary | ICD-10-CM | POA: Diagnosis not present

## 2021-10-06 DIAGNOSIS — J3089 Other allergic rhinitis: Secondary | ICD-10-CM | POA: Diagnosis not present

## 2021-10-06 DIAGNOSIS — J301 Allergic rhinitis due to pollen: Secondary | ICD-10-CM | POA: Diagnosis not present

## 2021-10-11 ENCOUNTER — Encounter: Payer: Self-pay | Admitting: Family Medicine

## 2021-10-11 DIAGNOSIS — F419 Anxiety disorder, unspecified: Secondary | ICD-10-CM

## 2021-10-11 DIAGNOSIS — G4700 Insomnia, unspecified: Secondary | ICD-10-CM

## 2021-10-12 DIAGNOSIS — J3089 Other allergic rhinitis: Secondary | ICD-10-CM | POA: Diagnosis not present

## 2021-10-12 DIAGNOSIS — J301 Allergic rhinitis due to pollen: Secondary | ICD-10-CM | POA: Diagnosis not present

## 2021-10-18 ENCOUNTER — Encounter: Payer: Self-pay | Admitting: Family Medicine

## 2021-10-18 ENCOUNTER — Other Ambulatory Visit: Payer: Self-pay | Admitting: Family Medicine

## 2021-10-18 NOTE — Addendum Note (Signed)
Addended by: Patrcia Dolly on: 10/18/2021 04:45 PM ? ? Modules accepted: Orders ? ?

## 2021-10-18 NOTE — Telephone Encounter (Signed)
Pt is requesting refill clonazepam not previous filled by you can you do this without a visit  ? ?

## 2021-10-19 DIAGNOSIS — J301 Allergic rhinitis due to pollen: Secondary | ICD-10-CM | POA: Diagnosis not present

## 2021-10-19 DIAGNOSIS — J3089 Other allergic rhinitis: Secondary | ICD-10-CM | POA: Diagnosis not present

## 2021-10-19 MED ORDER — CLONAZEPAM 0.5 MG PO TABS: ORAL_TABLET | ORAL | 1 refills | Status: DC

## 2021-10-19 NOTE — Telephone Encounter (Signed)
Controlled substance database reviewed.  Last filled clonazepam 09/20/2021,  # 30.  Medication and plan was discussed in December. Will refill medication temporarily, order placed.  ?

## 2021-10-19 NOTE — Addendum Note (Signed)
Addended by: Merri Ray R on: 10/19/2021 02:04 PM ? ? Modules accepted: Orders ? ?

## 2021-10-21 ENCOUNTER — Ambulatory Visit: Payer: Medicare HMO | Admitting: Dermatology

## 2021-10-21 ENCOUNTER — Other Ambulatory Visit: Payer: Self-pay

## 2021-10-21 ENCOUNTER — Encounter: Payer: Self-pay | Admitting: Dermatology

## 2021-10-21 DIAGNOSIS — L920 Granuloma annulare: Secondary | ICD-10-CM | POA: Diagnosis not present

## 2021-10-21 DIAGNOSIS — L570 Actinic keratosis: Secondary | ICD-10-CM

## 2021-10-21 DIAGNOSIS — Z1283 Encounter for screening for malignant neoplasm of skin: Secondary | ICD-10-CM | POA: Diagnosis not present

## 2021-10-21 NOTE — Patient Instructions (Addendum)
Over the counter 1% cortisone cream for the Underarms  ?

## 2021-10-26 DIAGNOSIS — J3089 Other allergic rhinitis: Secondary | ICD-10-CM | POA: Diagnosis not present

## 2021-10-26 DIAGNOSIS — J301 Allergic rhinitis due to pollen: Secondary | ICD-10-CM | POA: Diagnosis not present

## 2021-10-27 ENCOUNTER — Ambulatory Visit: Payer: Medicare HMO | Admitting: Dermatology

## 2021-10-31 ENCOUNTER — Encounter: Payer: Self-pay | Admitting: Dermatology

## 2021-10-31 NOTE — Progress Notes (Signed)
? ?  Follow-Up Visit ?  ?Subjective  ?Tracey Ayala is a 71 y.o. female who presents for the following: Skin Problem (Patient here today to have lesion on her chest checked. Per patient when she was here in December the lesion was on her chest and since then it has fallen off per patient no bleeding no pain. Patient states that she has discoloration under both axilla x 6 months no treatment. ). ? ?Growth on chest fell off, rash under arms. ?Location:  ?Duration:  ?Quality:  ?Associated Signs/Symptoms: ?Modifying Factors:  ?Severity:  ?Timing: ?Context:  ? ?Objective  ?Well appearing patient in no apparent distress; mood and affect are within normal limits. ?Upper Body ?Waist up skin examination: No current atypical pigmented lesions or nonmelanoma skin cancer (new or recurrent) ? 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 ?Left Axilla, Right Axilla ?Subtle pink-tan discoloration of the head of dermal micropapules compatible with adult granuloma annulare.  Currently no symptoms. ? ? ? ?All skin waist up examined.  He is beneath undergarments not fully examined. ? ? ?Assessment & Plan  ? ? ?Screening exam for skin cancer ?Upper Body ? ?Annual skin examination. ? 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 ?Return if there is evidence of recurrence. ? 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 ?Discussed with little is known about the precise etiology of this disorder along with the probability that it may eventually spontaneously go away.  I offered to obtain confirmatory biopsy but patient chooses to watch unless there is clinical worsening. ? ? ? ? ? ?I, Lavonna Monarch, MD, have reviewed all documentation for this visit.  The documentation on 10/31/21 for the exam, diagnosis, procedures, and orders are all accurate and complete. ?

## 2021-11-02 DIAGNOSIS — J301 Allergic rhinitis due to pollen: Secondary | ICD-10-CM | POA: Diagnosis not present

## 2021-11-02 DIAGNOSIS — J3089 Other allergic rhinitis: Secondary | ICD-10-CM | POA: Diagnosis not present

## 2021-11-04 ENCOUNTER — Encounter: Payer: Self-pay | Admitting: Family Medicine

## 2021-11-04 ENCOUNTER — Other Ambulatory Visit: Payer: Self-pay

## 2021-11-04 ENCOUNTER — Emergency Department (HOSPITAL_BASED_OUTPATIENT_CLINIC_OR_DEPARTMENT_OTHER)
Admission: EM | Admit: 2021-11-04 | Discharge: 2021-11-04 | Disposition: A | Discharge status: Home / Self Care | Payer: Medicare HMO | Attending: Emergency Medicine | Admitting: Emergency Medicine

## 2021-11-04 ENCOUNTER — Emergency Department (HOSPITAL_BASED_OUTPATIENT_CLINIC_OR_DEPARTMENT_OTHER): Payer: Medicare HMO

## 2021-11-04 ENCOUNTER — Encounter (HOSPITAL_BASED_OUTPATIENT_CLINIC_OR_DEPARTMENT_OTHER): Payer: Self-pay | Admitting: Obstetrics and Gynecology

## 2021-11-04 ENCOUNTER — Ambulatory Visit (INDEPENDENT_AMBULATORY_CARE_PROVIDER_SITE_OTHER): Payer: Medicare HMO | Admitting: Family Medicine

## 2021-11-04 VITALS — BP 116/70 | HR 79 | Temp 98.1°F | Resp 15 | Ht 62.5 in | Wt 108.2 lb

## 2021-11-04 DIAGNOSIS — Z20822 Contact with and (suspected) exposure to covid-19: Secondary | ICD-10-CM | POA: Insufficient documentation

## 2021-11-04 DIAGNOSIS — R630 Anorexia: Secondary | ICD-10-CM | POA: Diagnosis not present

## 2021-11-04 DIAGNOSIS — F419 Anxiety disorder, unspecified: Secondary | ICD-10-CM

## 2021-11-04 DIAGNOSIS — R55 Syncope and collapse: Secondary | ICD-10-CM | POA: Insufficient documentation

## 2021-11-04 DIAGNOSIS — R197 Diarrhea, unspecified: Secondary | ICD-10-CM | POA: Diagnosis not present

## 2021-11-04 DIAGNOSIS — Z743 Need for continuous supervision: Secondary | ICD-10-CM | POA: Diagnosis not present

## 2021-11-04 DIAGNOSIS — R110 Nausea: Secondary | ICD-10-CM | POA: Insufficient documentation

## 2021-11-04 DIAGNOSIS — G4700 Insomnia, unspecified: Secondary | ICD-10-CM

## 2021-11-04 DIAGNOSIS — R42 Dizziness and giddiness: Secondary | ICD-10-CM | POA: Diagnosis not present

## 2021-11-04 DIAGNOSIS — I10 Essential (primary) hypertension: Secondary | ICD-10-CM | POA: Diagnosis not present

## 2021-11-04 DIAGNOSIS — E785 Hyperlipidemia, unspecified: Secondary | ICD-10-CM

## 2021-11-04 DIAGNOSIS — I959 Hypotension, unspecified: Secondary | ICD-10-CM | POA: Diagnosis not present

## 2021-11-04 DIAGNOSIS — R69 Illness, unspecified: Secondary | ICD-10-CM | POA: Diagnosis not present

## 2021-11-04 LAB — BASIC METABOLIC PANEL
Anion gap: 5 (ref 5–15)
BUN: 15 mg/dL (ref 8–23)
CO2: 28 mmol/L (ref 22–32)
Calcium: 8.2 mg/dL — ABNORMAL LOW (ref 8.9–10.3)
Chloride: 105 mmol/L (ref 98–111)
Creatinine, Ser: 0.77 mg/dL (ref 0.44–1.00)
GFR, Estimated: >60 mL/min (ref >60)
Glucose, Bld: 100 mg/dL — ABNORMAL HIGH (ref 70–99)
Potassium: 3.8 mmol/L (ref 3.5–5.1)
Sodium: 138 mmol/L (ref 135–145)

## 2021-11-04 LAB — URINALYSIS, ROUTINE W REFLEX MICROSCOPIC
Bilirubin Urine: NEGATIVE
Glucose, UA: NEGATIVE mg/dL
Hgb urine dipstick: NEGATIVE
Ketones, ur: 15 mg/dL — AB
Leukocytes,Ua: NEGATIVE
Nitrite: NEGATIVE
Protein, ur: NEGATIVE mg/dL
Specific Gravity, Urine: 1.006 (ref 1.005–1.030)
pH: 6.5 (ref 5.0–8.0)

## 2021-11-04 LAB — CBC
HCT: 35 % — ABNORMAL LOW (ref 36.0–46.0)
Hemoglobin: 11.8 g/dL — ABNORMAL LOW (ref 12.0–15.0)
MCH: 29.3 pg (ref 26.0–34.0)
MCHC: 33.7 g/dL (ref 30.0–36.0)
MCV: 86.8 fL (ref 80.0–100.0)
Platelets: 155 10*3/uL (ref 150–400)
RBC: 4.03 MIL/uL (ref 3.87–5.11)
RDW: 12.8 % (ref 11.5–15.5)
WBC: 4.8 10*3/uL (ref 4.0–10.5)
nRBC: 0 % (ref 0.0–0.2)

## 2021-11-04 LAB — RESP PANEL BY RT-PCR (FLU A&B, COVID) ARPGX2
Influenza A by PCR: NEGATIVE
Influenza B by PCR: NEGATIVE
SARS Coronavirus 2 by RT PCR: NEGATIVE

## 2021-11-04 LAB — MAGNESIUM: Magnesium: 1.9 mg/dL (ref 1.7–2.4)

## 2021-11-04 LAB — TROPONIN I (HIGH SENSITIVITY)
Troponin I (High Sensitivity): <2 ng/L (ref <18)
Troponin I (High Sensitivity): 3 ng/L (ref <18)

## 2021-11-04 LAB — CBG MONITORING, ED: Glucose-Capillary: 100 mg/dL — ABNORMAL HIGH (ref 70–99)

## 2021-11-04 MED ORDER — ONDANSETRON HCL 4 MG PO TABS: 4.0000 mg | ORAL_TABLET | Freq: Three times a day (TID) | ORAL | 0 refills | Status: DC | PRN

## 2021-11-04 MED ORDER — CITALOPRAM HYDROBROMIDE 20 MG PO TABS: 20.0000 mg | ORAL_TABLET | Freq: Every evening | ORAL | 1 refills | Status: DC

## 2021-11-04 MED ORDER — LISINOPRIL 10 MG PO TABS: 10.0000 mg | ORAL_TABLET | Freq: Every day | ORAL | 1 refills | Status: DC

## 2021-11-04 MED ORDER — ATORVASTATIN CALCIUM 10 MG PO TABS: ORAL_TABLET | ORAL | 1 refills | Status: DC

## 2021-11-04 NOTE — Progress Notes (Signed)
? ?Subjective:  ?Patient ID: Tracey Ayala, female    DOB: 1951-05-08  Age: 71 y.o. MRN: [id number] ? ?CC:  ?Chief Complaint  ?Patient presents with  ? Insomnia  ?  Pt notes has not been bothered much by insomnia notes she has been sleeping pretty well recently   ? Diarrhea  ?  Pt notes has had diarrhea since yesterday morning, denies abdominal cramping after eating but has had a poor appetite since started   ? ? ?HPI ?Tracey Ayala presents for  ? ?Generalized anxiety disorder, insomnia ?Discussed at her establish care visit 07/30/2021.  Celexa 20 mg daily.  Initially for insomnia after menopause.  Difficulty with sleep onset, started on Klonopin years ago initially few days per week.  Ultimately requiring nightly.  No relief with trazodone.  Option of low-dose hydroxyzine in place of Klonopin provided at her last visit.  See follow-up email.  Tried hydroxyzine with extremely dry mouth and headache.  Decided to remain on just Klonopin. ?Sleeping well., no daytime somnolence, no side effects with celexa, klonopin. Lives alone. Trying to get out of house and staying active - walking and time at gym.  ?Controlled substance database (PDMP) reviewed. No concerns appreciated.  ?Last filled 10/19/21 ?Took vitamin D in the past, stopped after possible darkening underneath arms.  Rash resolved.  No current rash.  No known history of low vitamin D. ? ?Diarrhea ?Went to lunch 2 days ago. Ice cream did not taste right. ?Diarrhea started yesterday morning. 4 episodes. No bloody diarrhea. Repeat episode after lunch - total 5 episodes.  ?No n/v/abd pain/fever ?No recent abx.  ? ?Diarrhea 2 times this am.  ?Drinking water, crackers last night. Less appetite, cracker and tea this am.  ? ? ? ?History ?Patient Active Problem List  ? Diagnosis Date Noted  ? Endometriosis   ? Allergic rhinitis 12/02/2020  ? Allergic rhinitis due to animal (cat) (dog) hair and dander 12/02/2020  ? 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 ?Past Medical History:  ?Diagnosis Date  ? Adnexal mass   ? Allergy   ? Receives injections once a week  ? Anxiety   ? Basal cell carcinoma 03/04/2013  ? bcc right nostril =mohs  ? Cataract   ? Endometriosis   ? 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 Hypercholesterolemia   ? Hypertension   ? Wears glasses   ? ?Past Surgical History:  ?Procedure Laterality Date  ? basal cell carcinoma from left shoulder  2018  ? mohs procedure  2014  ? right nostril  ? ovarian cystectomy and appendectomy Left 1978  ? ROBOTIC ASSISTED SALPINGO OOPHERECTOMY Bilateral 12/07/2020  ? Procedure: XI ROBOTIC ASSISTED BILATERAL SALPINGO OOPHORECTOMY, MINI LAPAROTOMY, PELVIC WASHINGS;  Surgeon: Lafonda Mosses, MD;  Location: [facility] [place];  Service: Gynecology;  Laterality: Bilateral;  ? [place]  ? ?Allergies  ?Allergen Reactions  ? Codeine Nausea Only  ?  Sick  ?Other reaction(s): GI upset nausea  ? Fosamax [Alendronate] Rash  ? ?Prior to Admission medications   ?Medication Sig Start Date End Date Taking? Authorizing Provider  ?atorvastatin (LIPITOR) 10 MG tablet atorvastatin 10 mg tablet ?  10 mg every day by oral route. 01/13/21  Yes [provider]  ?Cholecalciferol (VITAMIN D) 50 MCG (2000 UT) CAPS Take 2,000 Units by mouth daily. Vitamin d 3   Yes [provider]  ?citalopram (CELEXA) 20 MG tablet Take 20 mg by mouth every evening. 06/05/20  Yes  [provider]  ?clonazePAM (KLONOPIN) 0.5 MG tablet 1 tab po qd 10/19/21  Yes Wendie Agreste, MD  ?EPINEPHrine 0.3 mg/0.3 mL IJ SOAJ injection Inject 0.3 mg into the muscle as needed for anaphylaxis.   Yes [provider]  ?lisinopril (ZESTRIL) 10 MG tablet Take 10 mg by mouth daily. 06/02/20  Yes [provider]  ?Menaquinone-7 (VITAMIN K2 PO) Take 1 tablet by mouth daily.   Yes [provider]  ?Omega-3 Fatty Acids (FISH OIL) 1000 MG CAPS Take 1 capsule by mouth daily.   Yes [provider]   ?PRESCRIPTION MEDICATION Allergy Injections once a week.   Yes [provider]  ?vitamin C (ASCORBIC ACID) 500 MG tablet Take 500 mg by mouth daily.   Yes [provider]  ?Zinc 25 MG TABS Take 25 mg by mouth 3 (three) times a week.   Yes [provider]  ? ?Social History  ? ?Socioeconomic History  ? Marital status: Widowed  ?  Spouse name: Not on file  ? Number of children: Not on file  ? Years of education: Not on file  ? Highest education level: Not on file  ?Occupational History  ? Occupation: retired  ?Tobacco Use  ? Smoking status: Never  ? Smokeless tobacco: Never  ?Vaping Use  ? Vaping Use: Never used  ?Substance and Sexual Activity  ? Alcohol use: Never  ? Drug use: Never  ? Sexual activity: Not Currently  ?Other Topics Concern  ? Not on file  ?Social History Narrative  ? Not on file  ? ?Social Determinants of Health  ? ?Financial Resource Strain: Not on file  ?Food Insecurity: Not on file  ?Transportation Needs: Not on file  ?Physical Activity: Not on file  ?Stress: Not on file  ?Social Connections: Not on file  ?Intimate Partner Violence: Not on file  ? ? ?Review of Systems ?Per HPI ? ?Objective:  ? ?Vitals:  ? 11/04/21 1038  ?BP: 116/70  ?Pulse: 79  ?Resp: 15  ?Temp: 98.1 ?F (36.7 ?C)  ?TempSrc: Temporal  ?SpO2: 96%  ?Weight: 108 lb 3.2 oz (49.1 kg)  ?Height: 5' 2.5" (1.588 m)  ? ? ? ?Physical Exam ?Vitals reviewed.  ?Constitutional:   ?   Appearance: Normal appearance. She is well-developed.  ?HENT:  ?   Head: Normocephalic and atraumatic.  ?Eyes:  ?   Conjunctiva/sclera: Conjunctivae normal.  ?   Pupils: Pupils are equal, round, and reactive to light.  ?Neck:  ?   Vascular: No carotid bruit.  ?Cardiovascular:  ?   Rate and Rhythm: Normal rate and regular rhythm.  ?   Heart sounds: Normal heart sounds.  ?Pulmonary:  ?   Effort: Pulmonary effort is normal.  ?   Breath sounds: Normal breath sounds.  ?Abdominal:  ?   General: Abdomen is flat. There is no distension.  ?    Palpations: Abdomen is soft. There is no pulsatile mass.  ?   Tenderness: There is no abdominal tenderness. There is no guarding.  ?Musculoskeletal:  ?   Right lower leg: No edema.  ?   Left lower leg: No edema.  ?Skin: ?   General: Skin is warm and dry.  ?Neurological:  ?   Mental Status: She is alert and oriented to person, place, and time.  ?Psychiatric:     ?   Mood and Affect: Mood normal.     ?   Behavior: Behavior normal.  ? ? ? ? ? ?Assessment &  Plan:  ?LILLYONA POLASEK is a 71 y.o. female . ?Essential hypertension - Plan: lisinopril (ZESTRIL) 10 MG tablet ? -Tolerating current regimen, labs last visit, refills ordered.  Follow-up in June. ? ?Anxiety ?Insomnia, unspecified type ?Citalopram (CELEXA) 20 MG tablet ? -Anxiety stable.  Continue same dose Celexa.  Unfortunately did not tolerate full dose hydroxyzine, sleep has been managed well with Klonopin.  We did discuss the option of half dosing of hydroxyzine in place of Klonopin to see if that would be tolerated better.  Okay to refill Klonopin when due and is currently tolerating.  Potential risks of that medication have been discussed. ? ?Hyperlipidemia, unspecified hyperlipidemia type - Plan: atorvastatin (LIPITOR) 10 MG tablet  ? -Tolerating current regimen, updated labs planned in June.  Refills ordered. ? ?Diarrhea, unspecified type ? -Infectious versus foodborne illness.  No nausea/vomiting, fever or abdominal pain.  No recent antibiotics, and with few episodes per day unlikely C. difficile.  Tolerating p.o. fluids.  Reassuring exam.  Continue symptomatic treatment with RTC precautions given. ? ?Meds ordered this encounter  ?Medications  ? atorvastatin (LIPITOR) 10 MG tablet  ?  Sig: atorvastatin 10 mg tablet ?  10 mg every day by oral route.  ?  Dispense:  90 tablet  ?  Refill:  1  ? lisinopril (ZESTRIL) 10 MG tablet  ?  Sig: Take 1 tablet (10 mg total) by mouth daily.  ?  Dispense:  90 tablet  ?  Refill:  1  ? citalopram (CELEXA) 20 MG tablet  ?   Sig: Take 1 tablet (20 mg total) by mouth every evening.  ?  Dispense:  90 tablet  ?  Refill:  1  ? ?Patient Instructions  ?You can try half dose of hydroxyzine in place of klonopin but if same side effects or inef

## 2021-11-04 NOTE — ED Provider Notes (Signed)
?[facility] ?Provider Note ? ? ?CSN: [id number] ?Arrival date & time: 11/04/21  1600 ? ?  ? ?History ? ?Chief Complaint  ?Patient presents with  ? Loss of Consciousness  ? Hypotension  ? ? ?Tracey Ayala is a 71 y.o. female presenting emergency department with an episode of near syncope.  Supplemental history provided by paramedics.  The patient reports she has had a "stomach bug" for the past 2 days with very poor appetite, nausea, persistent diarrhea.  She says today she was going down the ER to talk to her lawn maintenance worker, and says while she was standing outside she began to feel extremely queasy and nauseated, was trying to walk back into the door and became lightheaded and flushed and hot all over.  Her lawn worker was able to catch her before she fell to the ground.  She had a very brief possible loss of consciousness but remembers waking up with him holding her.  Paramedics report that she was hypotensive on scene initially, gave her 1 L of fluid in route to the hospital as well as some Zofran for nausea.  Currently she feels better and is asymptomatic, continues to have some very mild nausea and loss of appetite.  She denies chest pain or pressure or history of syncope in the past.  She denies history of MI or coronary disease.  She denies any other recent respiratory symptoms or infectious symptoms.  She does live by himself.  Her sister is also present at the bedside ? ?HPI ? ?  ? ?Home Medications ?Prior to Admission medications   ?Medication Sig Start Date End Date Taking? Authorizing Provider  ?ondansetron (ZOFRAN) 4 MG tablet Take 1 tablet (4 mg total) by mouth every 8 (eight) hours as needed for up to 15 doses for nausea or vomiting. 11/04/21  Yes Shaw Dobek, [name], MD  ?atorvastatin (LIPITOR) 10 MG tablet atorvastatin 10 mg tablet ?  10 mg every day by oral route. 11/04/21   Wendie Agreste, MD  ?Cholecalciferol (VITAMIN D) 50 MCG (2000 UT) CAPS Take 2,000  Units by mouth daily. Vitamin d 3    [provider]  ?citalopram (CELEXA) 20 MG tablet Take 1 tablet (20 mg total) by mouth every evening. 11/04/21   Wendie Agreste, MD  ?clonazePAM Bobbye [place]) 0.5 MG tablet 1 tab po qd 10/19/21   Wendie Agreste, MD  ?EPINEPHrine 0.3 mg/0.3 mL IJ SOAJ injection Inject 0.3 mg into the muscle as needed for anaphylaxis.    [provider]  ?lisinopril (ZESTRIL) 10 MG tablet Take 1 tablet (10 mg total) by mouth daily. 11/04/21   Wendie Agreste, MD  ?Menaquinone-7 (VITAMIN K2 PO) Take 1 tablet by mouth daily.    [provider]  ?Omega-3 Fatty Acids (FISH OIL) 1000 MG CAPS Take 1 capsule by mouth daily.    [provider]  ?PRESCRIPTION MEDICATION Allergy Injections once a week.    [provider]  ?vitamin C (ASCORBIC ACID) 500 MG tablet Take 500 mg by mouth daily.    [provider]  ?Zinc 25 MG TABS Take 25 mg by mouth 3 (three) times a week.    [provider]  ?   ? ?Allergies    ?Codeine and Fosamax [alendronate]   ? ?Review of Systems   ?Review of Systems ? ?Physical Exam ?Updated Vital Signs ?BP (!) 151/85   Pulse 72   Temp 98.4 ?F (36.9 ?C) (Oral)   Resp  17   SpO2 98%  ?Physical Exam ?Constitutional:   ?   General: She is not in acute distress. ?HENT:  ?   Head: Normocephalic and atraumatic.  ?Eyes:  ?   Conjunctiva/sclera: Conjunctivae normal.  ?   Pupils: Pupils are equal, round, and reactive to light.  ?Cardiovascular:  ?   Rate and Rhythm: Normal rate and regular rhythm.  ?   Pulses: Normal pulses.  ?Pulmonary:  ?   Effort: Pulmonary effort is normal. No respiratory distress.  ?Abdominal:  ?   General: There is no distension.  ?   Tenderness: There is no abdominal tenderness.  ?Skin: ?   General: Skin is warm and dry.  ?Neurological:  ?   General: No focal deficit present.  ?   Mental Status: She is alert. Mental status is at baseline.  ?Psychiatric:     ?   Mood and Affect: Mood normal.     ?    Behavior: Behavior normal.  ? ? ?ED Results / Procedures / Treatments   ?Labs ?(all labs ordered are listed, but only abnormal results are displayed) ?Labs Reviewed  ?BASIC METABOLIC PANEL - Abnormal; Notable for the following components:  ?    Result Value  ? Glucose, Bld 100 (*)   ? Calcium 8.2 (*)   ? All other components within normal limits  ?CBC - Abnormal; Notable for the following components:  ? Hemoglobin 11.8 (*)   ? HCT 35.0 (*)   ? All other components within normal limits  ?URINALYSIS, ROUTINE W REFLEX MICROSCOPIC - Abnormal; Notable for the following components:  ? Color, Urine COLORLESS (*)   ? Ketones, ur 15 (*)   ? All other components within normal limits  ?CBG MONITORING, ED - Abnormal; Notable for the following components:  ? Glucose-Capillary 100 (*)   ? All other components within normal limits  ?RESP PANEL BY RT-PCR (FLU A&B, COVID) [id number]  ?MAGNESIUM  ?TROPONIN I (HIGH SENSITIVITY)  ?TROPONIN I (HIGH SENSITIVITY)  ? ? ?EKG ?EKG Interpretation ? ?Date/Time:  Thursday November 04 2021 16:08:29 EDT ?Ventricular Rate:  72 ?PR Interval:  164 ?QRS Duration: 96 ?QT Interval:  407 ?QTC Calculation: 446 ?R Axis:   76 ?Text Interpretation: Sinus rhythm Left atrial enlargement Confirmed by Octaviano Glow [phone]) on 11/04/2021 4:34:05 PM ? ?Radiology ?DG Chest 2 View ? ?Result Date: 11/04/2021 ?CLINICAL DATA:  Near syncope.  Aspiration suspected. EXAM: CHEST - 2 VIEW COMPARISON:  None FINDINGS: Heart size upper limits of normal. Mediastinal shadows are normal. The lungs are clear. No sign of infiltrate or aspiration. No effusion. No abnormal bone finding. IMPRESSION: No active cardiopulmonary disease. Electronically Signed   By: Nelson Chimes M.D.   On: 11/04/2021 16:57   ? ?Procedures ?Procedures  ? ? ?Medications Ordered in ED ?Medications - No data to display ? ?ED Course/ Medical Decision Making/ A&P ?Clinical Course as of 11/05/21 0907  ?Thu Nov 04, 2021  ?2012 Patient remains asymptomatic at this time,  telemetry has been unremarkable.  I think she is reasonably safe for discharge at this time.  She completed a full liter of fluid from EMS and does not need additional fluids at this time.  She is not nauseated.  I will place a referral to cardiology for possible echocardiogram as an outpatient as this was her first episode of near syncope, and she is agreeable for this plan.  Her sister will be taking her home [MT]  ?  ?Clinical Course User Index ?[  MT] Wyvonnia Dusky, MD  ? ?                        ?Medical Decision Making ?Amount and/or Complexity of Data Reviewed ?Labs: ordered. ?Radiology: ordered. ? ?Risk ?Prescription drug management. ? ? ?This patient presents to the ED with concern for near syncope. This involves an extensive number of treatment options, and is a complaint that carries with it a high risk of complications and morbidity.  The differential diagnosis includes orthostatic hypotension or vasovagal episode most likely given her recent dehydration and GI symptoms.  Less likely would be ACS but difficult to exclude, we will check troponins.  Also less likely but on the differential would be symptomatic anemia she has no signs or symptoms of pulmonary embolism at this time, no tachycardia or tachypnea. ? ?Glucose wnl for EMS, will recheck BS and electrolytes here ? ?Additional history obtained from EMS ? ?I ordered and personally interpreted labs.  The pertinent results include:  troponins low and flat, no significant anemia, no AKI, covid negative ? ?I ordered imaging studies including dg chest ?I independently visualized and interpreted imaging which showed no focal infiltrate ?I agree with the radiologist interpretation ? ?The patient was maintained on a cardiac monitor.  I personally viewed and interpreted the cardiac monitored which showed an underlying rhythm of: NSR ? ?Per my interpretation the patient's ECG shows NSR with no acute ischemic findings ? ?IV fluids 1 liter and zofran given by  EMS already ?I have reviewed the patients home medicines and have made adjustments as needed ? ?Test Considered:  ?- no hypoxia, tachycardia to suggest PE; overall low risk for clotting; did not feel CT PE indicated

## 2021-11-04 NOTE — ED Notes (Signed)
When this RN went to pull the 2nd troop it appeared to be occluded. This RN determined to either another start  another IV or to pull the blood off the IV but NOT flush and just pull the line This Rn checked with MD to see if it was ok to pull IV and not start another. MD agreed. IV would not pull back 3 about a quarter of the pig tail and was occluded. This RN did not flush the IV but left intact and pulled the line completely. MD notified and checked Pt. Pt determined to be ok. ?

## 2021-11-04 NOTE — ED Triage Notes (Signed)
Patient had a syncopal event while talking with a lawn care man, he caught her and lowered her to the ground. She has had some nausea, diarrhea and emesis. Patient had + orthostatic changes with EMS. CBG 123.  ?

## 2021-11-04 NOTE — Patient Instructions (Addendum)
You can try half dose of hydroxyzine in place of klonopin but if same side effects or ineffective return to klonopin.  Let me know when next refill of Klonopin needed and I can send that to your pharmacy.  Follow-up with me in June to discuss your other medications further.  Over-the-counter vitamin D is probably reasonable but if you want to wait and discuss at your next visit further we can certainly talk about testing your levels.  If you have any return of rash be seen sooner. ? ?Bland foods, fluids are most important for now.  Imodium over-the-counter if needed for diarrhea.  I expect symptoms to be improving through the weekend.  Hope you feel better soon. ? ?Return to the clinic or go to the nearest emergency room if any of your symptoms worsen or new symptoms occur. ? ?Diarrhea, Adult ?Diarrhea is frequent loose and watery bowel movements. Diarrhea can make you feel weak and cause you to become dehydrated. Dehydration can make you tired and thirsty, cause you to have a dry mouth, and decrease how often you urinate. ?Diarrhea typically lasts 2-3 days. However, it can last longer if it is a sign of something more serious. It is important to treat your diarrhea as told by your health care provider. ?Follow these instructions at home: ?Eating and drinking ?  ?Follow these recommendations as told by your health care provider: ?Take an oral rehydration solution (ORS). This is an over-the-counter medicine that helps return your body to its normal balance of nutrients and water. It is found at pharmacies and retail stores. ?Drink plenty of fluids, such as water, ice chips, diluted fruit juice, and low-calorie sports drinks. You can drink milk also, if desired. ?Avoid drinking fluids that contain a lot of sugar or caffeine, such as energy drinks, sports drinks, and soda. ?Eat bland, easy-to-digest foods in small amounts as you are able. These foods include bananas, applesauce, rice, lean meats, toast, and  crackers. ?Avoid alcohol. ?Avoid spicy or fatty foods. ? ?Medicines ?Take over-the-counter and prescription medicines only as told by your health care provider. ?If you were prescribed an antibiotic medicine, take it as told by your health care provider. Do not stop using the antibiotic even if you start to feel better. ?General instructions ? ?Wash your hands often using soap and water. If soap and water are not available, use a hand sanitizer. Others in the household should wash their hands as well. Hands should be washed: ?After using the toilet or changing a diaper. ?Before preparing, cooking, or serving food. ?While caring for a sick person or while visiting someone in a hospital. ?Drink enough fluid to keep your urine pale yellow. ?Rest at home while you recover. ?Watch your condition for any changes. ?Take a warm bath to relieve any burning or pain from frequent diarrhea episodes. ?Keep all follow-up visits as told by your health care provider. This is important. ?Contact a health care provider if: ?You have a fever. ?Your diarrhea gets worse. ?You have new symptoms. ?You cannot keep fluids down. ?You feel light-headed or dizzy. ?You have a headache. ?You have muscle cramps. ?Get help right away if: ?You have chest pain. ?You feel extremely weak or you faint. ?You have bloody or black stools or stools that look like tar. ?You have severe pain, cramping, or bloating in your abdomen. ?You have trouble breathing or you are breathing very quickly. ?Your heart is beating very quickly. ?Your skin feels cold and clammy. ?You feel confused. ?You  have signs of dehydration, such as: ?Dark urine, very little urine, or no urine. ?Cracked lips. ?Dry mouth. ?Sunken eyes. ?Sleepiness. ?Weakness. ?Summary ?Diarrhea is frequent loose and sometimes watery bowel movements. Diarrhea can make you feel weak and cause you to become dehydrated. ?Drink enough fluids to keep your urine pale yellow. ?Make sure that you wash your hands  after using the toilet. If soap and water are not available, use hand sanitizer. ?Contact a health care provider if your diarrhea gets worse or you have new symptoms. ?Get help right away if you have signs of dehydration. ?This information is not intended to replace advice given to you by your health care provider. Make sure you discuss any questions you have with your health care provider. ?Document Revised: 02/10/2021 Document Reviewed: 02/10/2021 ?Elsevier Patient Education ? 2022 [place]. ? ? ? ?

## 2021-11-09 ENCOUNTER — Encounter: Payer: Self-pay | Admitting: Family Medicine

## 2021-11-10 DIAGNOSIS — J3089 Other allergic rhinitis: Secondary | ICD-10-CM | POA: Diagnosis not present

## 2021-11-16 DIAGNOSIS — J3089 Other allergic rhinitis: Secondary | ICD-10-CM | POA: Diagnosis not present

## 2021-11-16 DIAGNOSIS — J3081 Allergic rhinitis due to animal (cat) (dog) hair and dander: Secondary | ICD-10-CM | POA: Diagnosis not present

## 2021-11-16 DIAGNOSIS — J301 Allergic rhinitis due to pollen: Secondary | ICD-10-CM | POA: Diagnosis not present

## 2021-11-23 DIAGNOSIS — J301 Allergic rhinitis due to pollen: Secondary | ICD-10-CM | POA: Diagnosis not present

## 2021-11-23 DIAGNOSIS — J3089 Other allergic rhinitis: Secondary | ICD-10-CM | POA: Diagnosis not present

## 2021-11-30 ENCOUNTER — Encounter (HOSPITAL_BASED_OUTPATIENT_CLINIC_OR_DEPARTMENT_OTHER): Payer: Self-pay

## 2021-11-30 ENCOUNTER — Ambulatory Visit (HOSPITAL_BASED_OUTPATIENT_CLINIC_OR_DEPARTMENT_OTHER): Payer: Medicare HMO | Admitting: Cardiovascular Disease

## 2021-11-30 DIAGNOSIS — J301 Allergic rhinitis due to pollen: Secondary | ICD-10-CM | POA: Diagnosis not present

## 2021-11-30 DIAGNOSIS — J3081 Allergic rhinitis due to animal (cat) (dog) hair and dander: Secondary | ICD-10-CM | POA: Diagnosis not present

## 2021-11-30 DIAGNOSIS — J3089 Other allergic rhinitis: Secondary | ICD-10-CM | POA: Diagnosis not present

## 2021-11-30 NOTE — Progress Notes (Incomplete)
?Cardiology Office Note:   ? ?Date:  11/30/2021  ? ?ID:  Tracey Ayala, DOB 1951-02-14, MRN [id number] ? ?PCP:  Wendie Agreste, MD ?  ?[facility] Providers ?Cardiologist:  None { ?Click to update primary MD,subspecialty MD or APP then REFRESH:1}   ? ?Referring MD: Wendie Agreste, MD  ? ?No chief complaint on file. ? ? ?History of Present Illness:   ? ?Tracey Ayala is a 71 y.o. female with a hx of hypertension, hyperlipidemia, adnexal mass, fibroma of right ovary, endometriosis, and basal cell carcinoma, here for the evaluation of near syncope. She presented to the ED on 11/04/2021 via EMS following an episode of near syncope with a possible brief loss of consciousness. While standing outside she felt extremely queasy and nauseated, and became lightheaded and flushed as she was walking back inside. Her lawn worker was able to catch her before she fell to the ground. EMS found her hypotensive on scene, and gave her 1 L of fluid in route to the hospital. Her EKG showed sinus rhythm at 72 bpm with left atrial enlargement. Telemetry was unremarkable. Given this was her first episode of near syncope, she was referred to outpatient cardiology for possible echocardiogram. ? ?Today, *** ? ? ?Past Medical History:  ?Diagnosis Date  ? Adnexal mass   ? Allergy   ? Receives injections once a week  ? Anxiety   ? Basal cell carcinoma 03/04/2013  ? bcc right nostril =mohs  ? Cataract   ? Endometriosis   ? 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 Hypercholesterolemia   ? Hypertension   ? Wears glasses   ? ? ?Past Surgical History:  ?Procedure Laterality Date  ? basal cell carcinoma from left shoulder  2018  ? mohs procedure  2014  ? right nostril  ? ovarian cystectomy and appendectomy Left 1978  ? ROBOTIC ASSISTED SALPINGO OOPHERECTOMY Bilateral 12/07/2020  ? Procedure: XI ROBOTIC ASSISTED BILATERAL SALPINGO OOPHORECTOMY, MINI LAPAROTOMY, PELVIC WASHINGS;  Surgeon: Lafonda Mosses, MD;  Location: [facility];  Service: Gynecology;  Laterality: Bilateral;  ? [place]  ? ? ?Current Medications: ?No outpatient medications have been marked as taking for the 12/02/21 encounter (Appointment) with Skeet Latch, MD.  ?  ? ?Allergies:   Codeine and Fosamax [alendronate]  ? ?Social History  ? ?Socioeconomic History  ? Marital status: Widowed  ?  Spouse name: Not on file  ? Number of children: Not on file  ? Years of education: Not on file  ? Highest education level: Not on file  ?Occupational History  ? Occupation: retired  ?Tobacco Use  ? Smoking status: Never  ? Smokeless tobacco: Never  ?Vaping Use  ? Vaping Use: Never used  ?Substance and Sexual Activity  ? Alcohol use: Never  ? Drug use: Never  ? Sexual activity: Not Currently  ?Other Topics Concern  ? Not on file  ?Social History Narrative  ? Not on file  ? ?Social Determinants of Health  ? ?Financial Resource Strain: Not on file  ?Food Insecurity: Not on file  ?Transportation Needs: Not on file  ?Physical Activity: Not on file  ?Stress: Not on file  ?Social Connections: Not on file  ?  ? ?Family History: ?The patient's family history includes Arthritis in her sister; Breast cancer in her maternal aunt; Cancer in her sister; Colon cancer in her maternal aunt; Diabetes in her mother; Early death in her father; Hyperlipidemia in her father and  mother; Hypertension in her father and mother; Stroke in her father and mother. There is no history of Endometrial cancer, Ovarian cancer, Pancreatic cancer, or Prostate cancer. ? ?ROS:   ?Please see the history of present illness.    ?All other systems reviewed and are negative. ? ?EKGs/Labs/Other Studies Reviewed:   ? ?The following studies were reviewed today: ? ?CT Abdomen/Pelvis 11/27/2020: ?FINDINGS: ?Lower chest: Mild pleural thickening right posterior costophrenic ?angle measuring up to 4 mm. No pleural effusion. 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 No acute airspace disease. ?  ?Hepatobiliary: Multiple hepatic  cysts are identified. Otherwise the ?liver is unremarkable. The gallbladder is grossly normal. ?  ?Pancreas: Unremarkable. No pancreatic ductal dilatation or ?surrounding inflammatory changes. ?  ?Spleen: Normal in size without focal abnormality. ?  ?Adrenals/Urinary Tract: There are multiple left renal calculi, ?largest measuring 5 mm. No right-sided calculi. No obstructive ?uropathy within either kidney. ?  ?Bladder is markedly distended, without filling defect. The adrenals ?are normal. ?  ?Stomach/Bowel: No bowel obstruction or ileus. No bowel wall ?thickening or inflammatory change. The appendix, if still present, ?is not well visualized. ?  ?Vascular/Lymphatic: No significant atherosclerosis. There is ?extrinsic compression on the inferior vena cava by a right pelvic ?mass. No pathologic adenopathy. ?  ?Reproductive: There is a large predominantly solid right adnexal ?mass measuring 9.6 x 10.1 x 6.7 cm, highly concerning for right ?ovarian neoplasm. This mass results in extrinsic mass effect upon ?the inferior vena cava. Gynecologic consultation recommended. The ?uterus is atrophic. Left adnexa is grossly normal. ?  ?Other: Trace free fluid right lower quadrant. No free ?intraperitoneal gas. No abdominal wall hernia. ?  ?Musculoskeletal: There are no acute or destructive bony lesions. ?Reconstructed images demonstrate no additional findings. ?  ?IMPRESSION: ?1. Circumscribed predominantly solid right adnexal mass, highly ?concerning for ovarian neoplasm. Gynecologic consultation may be ?useful. ?2. Extrinsic mass effect on the inferior vena cava due to the right ?adnexal mass described above. ?3. Trace free fluid right lower quadrant. ?4. Minimal right pleural thickening. No pleural effusion or airspace ?disease. ? ? ?EKG:   EKG is personally reviewed. ?12/02/2021: Sinus ***. Rate *** bpm. ? ?Recent Labs: ?07/30/2021: ALT 20 ?11/04/2021: BUN 15; Creatinine, Ser 0.77; Hemoglobin 11.8; Magnesium 1.9; Platelets  155; Potassium 3.8; Sodium 138  ? ?Recent Lipid Panel ?   ?Component Value Date/Time  ? CHOL 196 07/30/2021 1054  ? TRIG 60.0 07/30/2021 1054  ? HDL 97.60 07/30/2021 1054  ? CHOLHDL 2 07/30/2021 1054  ? VLDL 12.0 07/30/2021 1054  ? [place] 86 07/30/2021 1054  ? ? ? ?Risk Assessment/Calculations:   ?{Does this patient have ATRIAL FIBRILLATION?:[id number]} ? ?    ? ?Physical Exam:   ? ?Wt Readings from Last 3 Encounters:  ?11/04/21 108 lb 3.2 oz (49.1 kg)  ?07/30/21 108 lb (49 kg)  ?12/28/20 109 lb 12.8 oz (49.8 kg)  ?  ? ?VS:  There were no vitals taken for this visit. , BMI There is no height or weight on file to calculate BMI. ?GENERAL:  Well appearing ?HEENT: Pupils equal round and reactive, fundi not visualized, oral mucosa unremarkable ?NECK:  No jugular venous distention, waveform within normal limits, carotid upstroke brisk and symmetric, no bruits, no thyromegaly ?LYMPHATICS:  No cervical adenopathy ?LUNGS:  Clear to auscultation bilaterally ?HEART:  RRR.  PMI not displaced or sustained,S1 and S2 within normal limits, no S3, no S4, no clicks, no rubs, *** murmurs ?ABD:  Flat, positive bowel sounds normal in frequency in pitch, no bruits, no rebound,  no guarding, no midline pulsatile mass, no hepatomegaly, no splenomegaly ?EXT:  2 plus pulses throughout, no edema, no cyanosis no clubbing ?SKIN:  No rashes no nodules ?NEURO:  Cranial nerves II through XII grossly intact, motor grossly intact throughout ?PSYCH:  Cognitively intact, oriented to person place and time ? ? ?ASSESSMENT:   ? ?No diagnosis found. ?PLAN:   ? ?No problem-specific Assessment & Plan notes found for this encounter. ? ? ? ?{Are you ordering a CV Procedure (e.g. stress test, cath, DCCV, TEE, etc)?   Press F2        :[id number]}  ? ?Disposition: ?FU with Tiffany C. Oval Linsey, MD, [facility] in *** ? ?Medication Adjustments/Labs and Tests Ordered: ?Current medicines are reviewed at length with the patient today.  Concerns regarding medicines are  outlined above.  ? ?No orders of the defined types were placed in this encounter. ? ?No orders of the defined types were placed in this encounter. ? ?There are no Patient Instructions on file for this visit.  ?

## 2021-11-30 NOTE — Progress Notes (Incomplete)
?Cardiology Office Note:   ? ?Date:  11/30/2021  ? ?ID:  Tracey Ayala, DOB 07-Oct-1950, MRN [id number] ? ?PCP:  Tracey Agreste, Ayala ?  ?[facility] Providers ?Cardiologist:  None { ?Click to update primary Ayala,subspecialty Ayala or APP then REFRESH:1}   ? ?Referring Ayala: Tracey Ayala  ? ?No chief complaint on file. ? ? ?History of Present Illness:   ? ?Tracey Ayala is a 71 y.o. female with a hx of hypertension, hyperlipidemia, adnexal mass, fibroma of right ovary, endometriosis, and basal cell carcinoma, here for post-hospital follow-up and the evaluation of near-syncope at the request of Dr. Langston Masker. ? ?On 11/04/2021 she presented to the ED via EMS following an episode of near-syncope and a possible brief loss of consciousness. She was standing outside and began to feel extremely queasy and nauseated. While walking back inside she became lightheaded and flushed, but was caught by her Physiological scientist before she fell to the ground. EMS found her to be hypotensive on scene initially, and gave her 1 L of fluid and Zofran for nausea in route. Her EKG showed sinus rhythm at 72 bpm with left atrial enlargement. Telemetry was unremarkable. Given it was her first episode of near syncope, she was referred to outpatient cardiology for possible echocardiogram. ? ?Today, ? ?She denies any palpitations, chest pain, shortness of breath, or peripheral edema. No lightheadedness, headaches, syncope, orthopnea, or PND. ? ?(+) ? ?Past Medical History:  ?Diagnosis Date  ? Adnexal mass   ? Allergy   ? Receives injections once a week  ? Anxiety   ? Basal cell carcinoma 03/04/2013  ? bcc right nostril =mohs  ? Cataract   ? Endometriosis   ? 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 Hypercholesterolemia   ? Hypertension   ? Wears glasses   ? ? ?Past Surgical History:  ?Procedure Laterality Date  ? basal cell carcinoma from left shoulder  2018  ? mohs procedure  2014  ? right nostril  ? ovarian cystectomy and appendectomy Left 1978  ?  ROBOTIC ASSISTED SALPINGO OOPHERECTOMY Bilateral 12/07/2020  ? Procedure: XI ROBOTIC ASSISTED BILATERAL SALPINGO OOPHORECTOMY, MINI LAPAROTOMY, PELVIC WASHINGS;  Surgeon: Lafonda Mosses, Ayala;  Location: [facility];  Service: Gynecology;  Laterality: Bilateral;  ? [place]  ? ? ?Current Medications: ?No outpatient medications have been marked as taking for the 11/30/21 encounter (Appointment) with Skeet Latch, Ayala.  ?  ? ?Allergies:   Codeine and Fosamax [alendronate]  ? ?Social History  ? ?Socioeconomic History  ? Marital status: Widowed  ?  Spouse name: Not on file  ? Number of children: Not on file  ? Years of education: Not on file  ? Highest education level: Not on file  ?Occupational History  ? Occupation: retired  ?Tobacco Use  ? Smoking status: Never  ? Smokeless tobacco: Never  ?Vaping Use  ? Vaping Use: Never used  ?Substance and Sexual Activity  ? Alcohol use: Never  ? Drug use: Never  ? Sexual activity: Not Currently  ?Other Topics Concern  ? Not on file  ?Social History Narrative  ? Not on file  ? ?Social Determinants of Health  ? ?Financial Resource Strain: Not on file  ?Food Insecurity: Not on file  ?Transportation Needs: Not on file  ?Physical Activity: Not on file  ?Stress: Not on file  ?Social Connections: Not on file  ?  ? ?Family History: ?The patient's family history includes Arthritis in  her sister; Breast cancer in her maternal aunt; Cancer in her sister; Colon cancer in her maternal aunt; Diabetes in her mother; Early death in her father; Hyperlipidemia in her father and mother; Hypertension in her father and mother; Stroke in her father and mother. There is no history of Endometrial cancer, Ovarian cancer, Pancreatic cancer, or Prostate cancer. ? ?ROS:   ?Please see the history of present illness.    ? ?All other systems reviewed and are negative. ? ?EKGs/Labs/Other Studies Reviewed:   ? ?The following studies were reviewed today: ? ?DG Chest XR  11/04/2021: ?FINDINGS: ?Heart size upper limits of normal. Mediastinal shadows are normal. ?The lungs are clear. No sign of infiltrate or aspiration. No ?effusion. No abnormal bone finding. ?  ?IMPRESSION: ?No active cardiopulmonary disease. ? ?CT Abdomen/Pelvis 11/27/2020: ?FINDINGS: ?Lower chest: Mild pleural thickening right posterior costophrenic ?angle measuring up to 4 mm. No pleural effusion. 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 No acute airspace disease. ?  ?Hepatobiliary: Multiple hepatic cysts are identified. Otherwise the ?liver is unremarkable. The gallbladder is grossly normal. ?  ?Pancreas: Unremarkable. No pancreatic ductal dilatation or ?surrounding inflammatory changes. ?  ?Spleen: Normal in size without focal abnormality. ?  ?Adrenals/Urinary Tract: There are multiple left renal calculi, ?largest measuring 5 mm. No right-sided calculi. No obstructive ?uropathy within either kidney. ?  ?Bladder is markedly distended, without filling defect. The adrenals ?are normal. ?  ?Stomach/Bowel: No bowel obstruction or ileus. No bowel wall ?thickening or inflammatory change. The appendix, if still present, ?is not well visualized. ?  ?Vascular/Lymphatic: No significant atherosclerosis. There is ?extrinsic compression on the inferior vena cava by a right pelvic ?mass. No pathologic adenopathy. ?  ?Reproductive: There is a large predominantly solid right adnexal ?mass measuring 9.6 x 10.1 x 6.7 cm, highly concerning for right ?ovarian neoplasm. This mass results in extrinsic mass effect upon ?the inferior vena cava. Gynecologic consultation recommended. The ?uterus is atrophic. Left adnexa is grossly normal. ?  ?Other: Trace free fluid right lower quadrant. No free ?intraperitoneal gas. No abdominal wall hernia. ?  ?Musculoskeletal: There are no acute or destructive bony lesions. ?Reconstructed images demonstrate no additional findings. ?  ?IMPRESSION: ?1. Circumscribed predominantly solid right adnexal mass, highly ?concerning  for ovarian neoplasm. Gynecologic consultation may be ?useful. ?2. Extrinsic mass effect on the inferior vena cava due to the right ?adnexal mass described above. ?3. Trace free fluid right lower quadrant. ?4. Minimal right pleural thickening. No pleural effusion or airspace ?disease. ? ? ?EKG:   EKG is personally reviewed. ?11/30/2021: Sinus ***. Rate *** bpm. ? ?Recent Labs: ?07/30/2021: ALT 20 ?11/04/2021: BUN 15; Creatinine, Ser 0.77; Hemoglobin 11.8; Magnesium 1.9; Platelets 155; Potassium 3.8; Sodium 138  ? ?Recent Lipid Panel ?   ?Component Value Date/Time  ? CHOL 196 07/30/2021 1054  ? TRIG 60.0 07/30/2021 1054  ? HDL 97.60 07/30/2021 1054  ? CHOLHDL 2 07/30/2021 1054  ? VLDL 12.0 07/30/2021 1054  ? [place] 86 07/30/2021 1054  ? ? ? ?Risk Assessment/Calculations:   ?{Does this patient have ATRIAL FIBRILLATION?:[id number]} ? ?    ? ?Physical Exam:   ? ?Wt Readings from Last 3 Encounters:  ?11/04/21 108 lb 3.2 oz (49.1 kg)  ?07/30/21 108 lb (49 kg)  ?12/28/20 109 lb 12.8 oz (49.8 kg)  ?  ? ?VS:  There were no vitals taken for this visit. , BMI There is no height or weight on file to calculate BMI. ?GENERAL:  Well appearing ?HEENT: Pupils equal round and reactive, fundi not visualized,  oral mucosa unremarkable ?NECK:  No jugular venous distention, waveform within normal limits, carotid upstroke brisk and symmetric, no bruits, no thyromegaly ?LYMPHATICS:  No cervical adenopathy ?LUNGS:  Clear to auscultation bilaterally ?HEART:  RRR.  PMI not displaced or sustained,S1 and S2 within normal limits, no S3, no S4, no clicks, no rubs, *** murmurs ?ABD:  Flat, positive bowel sounds normal in frequency in pitch, no bruits, no rebound, no guarding, no midline pulsatile mass, no hepatomegaly, no splenomegaly ?EXT:  2 plus pulses throughout, no edema, no cyanosis no clubbing ?SKIN:  No rashes no nodules ?NEURO:  Cranial nerves II through XII grossly intact, motor grossly intact throughout ?PSYCH:  Cognitively intact,  oriented to person place and time ? ? ?ASSESSMENT:   ? ?No diagnosis found. ?PLAN:   ? ?No problem-specific Assessment & Plan notes found for this encounter. ? ? ? ?{Are you ordering a CV Procedure (e.g. stre

## 2021-12-02 ENCOUNTER — Ambulatory Visit (HOSPITAL_BASED_OUTPATIENT_CLINIC_OR_DEPARTMENT_OTHER): Payer: Medicare HMO | Admitting: Cardiovascular Disease

## 2021-12-07 DIAGNOSIS — J3081 Allergic rhinitis due to animal (cat) (dog) hair and dander: Secondary | ICD-10-CM | POA: Diagnosis not present

## 2021-12-07 DIAGNOSIS — H26491 Other secondary cataract, right eye: Secondary | ICD-10-CM | POA: Diagnosis not present

## 2021-12-07 DIAGNOSIS — J3089 Other allergic rhinitis: Secondary | ICD-10-CM | POA: Diagnosis not present

## 2021-12-07 DIAGNOSIS — J301 Allergic rhinitis due to pollen: Secondary | ICD-10-CM | POA: Diagnosis not present

## 2021-12-07 DIAGNOSIS — Z961 Presence of intraocular lens: Secondary | ICD-10-CM | POA: Diagnosis not present

## 2021-12-07 DIAGNOSIS — H538 Other visual disturbances: Secondary | ICD-10-CM | POA: Diagnosis not present

## 2021-12-07 DIAGNOSIS — H43812 Vitreous degeneration, left eye: Secondary | ICD-10-CM | POA: Diagnosis not present

## 2021-12-15 DIAGNOSIS — J3089 Other allergic rhinitis: Secondary | ICD-10-CM | POA: Diagnosis not present

## 2021-12-15 DIAGNOSIS — J301 Allergic rhinitis due to pollen: Secondary | ICD-10-CM | POA: Diagnosis not present

## 2021-12-15 DIAGNOSIS — J3081 Allergic rhinitis due to animal (cat) (dog) hair and dander: Secondary | ICD-10-CM | POA: Diagnosis not present

## 2021-12-21 DIAGNOSIS — J301 Allergic rhinitis due to pollen: Secondary | ICD-10-CM | POA: Diagnosis not present

## 2021-12-21 DIAGNOSIS — J3089 Other allergic rhinitis: Secondary | ICD-10-CM | POA: Diagnosis not present

## 2021-12-23 ENCOUNTER — Encounter (HOSPITAL_BASED_OUTPATIENT_CLINIC_OR_DEPARTMENT_OTHER): Payer: Self-pay | Admitting: Cardiovascular Disease

## 2021-12-23 ENCOUNTER — Ambulatory Visit (HOSPITAL_BASED_OUTPATIENT_CLINIC_OR_DEPARTMENT_OTHER): Payer: Medicare HMO | Admitting: Cardiovascular Disease

## 2021-12-23 DIAGNOSIS — I10 Essential (primary) hypertension: Secondary | ICD-10-CM | POA: Diagnosis not present

## 2021-12-23 DIAGNOSIS — R55 Syncope and collapse: Secondary | ICD-10-CM | POA: Diagnosis not present

## 2021-12-23 DIAGNOSIS — I341 Nonrheumatic mitral (valve) prolapse: Secondary | ICD-10-CM | POA: Insufficient documentation

## 2021-12-23 HISTORY — DX: Syncope and collapse: R55

## 2021-12-23 HISTORY — DX: Nonrheumatic mitral (valve) prolapse: I34.1

## 2021-12-23 HISTORY — DX: Essential (primary) hypertension: I10

## 2021-12-23 NOTE — Assessment & Plan Note (Signed)
She does have a slight murmur and has a history of mitral valve prolapse.  We will get an echocardiogram.  She does not have any evidence of heart failure on exam. ?

## 2021-12-23 NOTE — Patient Instructions (Signed)
Medication Instructions:  Your physician recommends that you continue on your current medications as directed. Please refer to the Current Medication list given to you today.  Labwork: NONE  Testing/Procedures: Your physician has requested that you have an echocardiogram. Echocardiography is a painless test that uses sound waves to create images of your heart. It provides your doctor with information about the size and shape of your heart and how well your heart's chambers and valves are working. This procedure takes approximately one hour. There are no restrictions for this procedure.  Follow-Up: AS NEEDED    

## 2021-12-23 NOTE — Assessment & Plan Note (Signed)
Blood pressures well controlled on lisinopril.  We did discuss about taking this in the future if she ever has any issues with intravascular depletion. ?

## 2021-12-23 NOTE — Progress Notes (Signed)
? ? ?Cardiology Office Note ? ? ?Date:  12/23/2021  ? ?ID:  Tracey Ayala, DOB [birth year], MRN [id number] ? ?PCP:  Wendie Agreste, MD  ?Cardiologist:   Skeet Latch, MD  ? ?No chief complaint on file. ? ? ?History of Present Illness: ?Tracey Ayala is a 71 y.o. female who is being seen today for the evaluation of near syncope at the request of [name], [name], MD.  She seen in the ED 10/2021 with near syncope.  For the preceding 2 days she had a "stomach bug".  Her appetite was poor and she had persistent diarrhea.  She was standing outside when she felt poorly and then when she walked back inside she got flushed, lightheaded, and hot all over.  There was no CP or SOB but she did have anusea.   The person when she was talking to caught her before she hit the ground.  She was reportedly hypotensive when EMS arrived.  They gave her a liter of IV fluids and her symptoms improved.  ED evaluation was unremarkable.  It took her a couple weeks to recover her strength.  ? ?She walks 2 miles 4-5 days per week.  She also goes to gym 1-2 days per week.  She has no recurrent issues.  She has no exertional chest pain or shortness of breath.  She has not had any lower extremity edema, orthopnea, or PND.  She was told a long time ago that she had mitral valve prolapse.  She notes that sometimes when she lays on her left side she feels palpitations.  She does does not notice them with exertion or throughout the day. ? ?Past Medical History:  ?Diagnosis Date  ? Adnexal mass   ? Allergy   ? Receives injections once a week  ? Anxiety   ? Basal cell carcinoma 03/04/2013  ? bcc right nostril =mohs  ? Cataract   ? Endometriosis   ? Essential hypertension 12/23/2021  ? 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 Hypercholesterolemia   ? Hypertension   ? Mitral valve prolapse 12/23/2021  ? Syncope and collapse 12/23/2021  ? Wears glasses   ? ? ?Past Surgical History:  ?Procedure Laterality Date  ? basal cell carcinoma from left  shoulder  2018  ? mohs procedure  2014  ? right nostril  ? ovarian cystectomy and appendectomy Left 1978  ? ROBOTIC ASSISTED SALPINGO OOPHERECTOMY Bilateral 12/07/2020  ? Procedure: XI ROBOTIC ASSISTED BILATERAL SALPINGO OOPHORECTOMY, MINI LAPAROTOMY, PELVIC WASHINGS;  Surgeon: Lafonda Mosses, MD;  Location: [facility];  Service: Gynecology;  Laterality: Bilateral;  ? [place] EXTRACTION  1974  ? ? ? ?Current Outpatient Medications  ?Medication Sig Dispense Refill  ? atorvastatin (LIPITOR) 10 MG tablet atorvastatin 10 mg tablet ?  10 mg every day by oral route. 90 tablet 1  ? Cholecalciferol (VITAMIN D) 50 MCG (2000 UT) CAPS Take 2,000 Units by mouth daily. Vitamin d 3    ? citalopram (CELEXA) 20 MG tablet Take 1 tablet (20 mg total) by mouth every evening. 90 tablet 1  ? clonazePAM (KLONOPIN) 0.5 MG tablet 1 tab po qd 30 tablet 1  ? EPINEPHrine 0.3 mg/0.3 mL IJ SOAJ injection Inject 0.3 mg into the muscle as needed for anaphylaxis.    ? lisinopril (ZESTRIL) 10 MG tablet Take 1 tablet (10 mg total) by mouth daily. 90 tablet 1  ? Menaquinone-7 (VITAMIN K2 PO) Take 1 tablet by mouth daily.    ?  Omega-3 Fatty Acids (FISH OIL) 1000 MG CAPS Take 1 capsule by mouth daily.    ? PRESCRIPTION MEDICATION Allergy Injections once a week.    ? vitamin C (ASCORBIC ACID) 500 MG tablet Take 500 mg by mouth daily.    ? Zinc 25 MG TABS Take 25 mg by mouth 3 (three) times a week.    ? ?No current facility-administered medications for this visit.  ? ? ?Allergies:   Codeine and Fosamax [alendronate]  ? ? ?Social History:  The patient  reports that she has never smoked. She has never used smokeless tobacco. She reports that she does not drink alcohol and does not use drugs.  ? ?Family History:  The patient's family history includes Arthritis in her sister; Breast cancer in her maternal aunt; Cancer in her sister; Colon cancer in her maternal aunt; Diabetes in her mother; Early death in her father; Heart failure in  her maternal grandmother; Hyperlipidemia in her father and mother; Hypertension in her father and mother; Stroke in her father, maternal grandmother, and mother.  ? ? ?ROS:  Please see the history of present illness.   Otherwise, review of systems are positive for none.   All other systems are reviewed and negative.  ? ? ?PHYSICAL EXAM: ?VS:  BP 118/70 (BP Location: Left Arm, Patient Position: Sitting, Cuff Size: Normal)   Pulse 65   Ht 5' 2.5" (1.588 m)   Wt 109 lb (49.4 kg)   BMI 19.62 kg/m?  , BMI Body mass index is 19.62 kg/m?. ?GENERAL:  Well appearing ?HEENT:  Pupils equal round and reactive, fundi not visualized, oral mucosa unremarkable ?NECK:  No jugular venous distention, waveform within normal limits, carotid upstroke brisk and symmetric, no bruits, no thyromegaly ?LUNGS:  Clear to auscultation bilaterally ?HEART:  RRR.  PMI not displaced or sustained,S1 and S2 within normal limits, no S3, no S4, no clicks, no rubs, II/VI systolic murmur at the LLSB ?ABD:  Flat, positive bowel sounds normal in frequency in pitch, no bruits, no rebound, no guarding, no midline pulsatile mass, no hepatomegaly, no splenomegaly ?EXT:  2 plus pulses throughout, no edema, no cyanosis no clubbing ?SKIN:  No rashes no nodules ?NEURO:  Cranial nerves II through XII grossly intact, motor grossly intact throughout ?PSYCH:  Cognitively intact, oriented to person place and time ? ? ? ?EKG:  EKG is ordered today. ?The ekg ordered today demonstrates sinus rhythm.  Rate 65 bpm. ? ? ?Recent Labs: ?07/30/2021: ALT 20 ?11/04/2021: BUN 15; Creatinine, Ser 0.77; Hemoglobin 11.8; Magnesium 1.9; Platelets 155; Potassium 3.8; Sodium 138  ? ? ?Lipid Panel ?   ?Component Value Date/Time  ? CHOL 196 07/30/2021 1054  ? TRIG 60.0 07/30/2021 1054  ? HDL 97.60 07/30/2021 1054  ? CHOLHDL 2 07/30/2021 1054  ? VLDL 12.0 07/30/2021 1054  ? [place] 86 07/30/2021 1054  ? ?  ? ?Wt Readings from Last 3 Encounters:  ?12/23/21 109 lb (49.4 kg)  ?11/04/21 108  lb 3.2 oz (49.1 kg)  ?07/30/21 108 lb (49 kg)  ?  ? ? ?ASSESSMENT AND PLAN: ? ?Syncope and collapse ?Tracey Ayala had an episode of syncope that occurred in the setting of diarrhea and intravascular volume depletion.  She was also taking her lisinopril at the time.  She has not had any recurrent symptoms.  No further intervention required at this time.   No QT prolongation or arrhythmias on EKG. ? ?Mitral valve prolapse ?She does have a slight murmur and has a history  of mitral valve prolapse.  We will get an echocardiogram.  She does not have any evidence of heart failure on exam. ? ?Essential hypertension ?Blood pressures well controlled on lisinopril.  We did discuss about taking this in the future if she ever has any issues with intravascular depletion. ? ? ?Current medicines are reviewed at length with the patient today.  The patient does not have concerns regarding medicines. ? ?The following changes have been made:  no change ? ?Labs/ tests ordered today include:  ? ?Orders Placed This Encounter  ?Procedures  ? EKG 12-Lead  ? ECHOCARDIOGRAM COMPLETE  ? ? ? ?Disposition:   FU with Nykira Reddix C. Oval Linsey, MD, [facility] as needed ? ? ? ?Signed, ?Vivian Neuwirth C. Oval Linsey, MD, [facility]  ?12/23/2021 3:51 PM    ?[place] ?

## 2021-12-23 NOTE — Assessment & Plan Note (Addendum)
Tracey Ayala had an episode of syncope that occurred in the setting of diarrhea and intravascular volume depletion.  She was also taking her lisinopril at the time.  She has not had any recurrent symptoms.  No further intervention required at this time.   No QT prolongation or arrhythmias on EKG. ?

## 2021-12-24 DIAGNOSIS — C44619 Basal cell carcinoma of skin of left upper limb, including shoulder: Secondary | ICD-10-CM | POA: Diagnosis not present

## 2021-12-24 DIAGNOSIS — L300 Nummular dermatitis: Secondary | ICD-10-CM | POA: Diagnosis not present

## 2021-12-24 DIAGNOSIS — L821 Other seborrheic keratosis: Secondary | ICD-10-CM | POA: Diagnosis not present

## 2021-12-24 DIAGNOSIS — Z85828 Personal history of other malignant neoplasm of skin: Secondary | ICD-10-CM | POA: Diagnosis not present

## 2021-12-24 DIAGNOSIS — D1801 Hemangioma of skin and subcutaneous tissue: Secondary | ICD-10-CM | POA: Diagnosis not present

## 2021-12-26 DIAGNOSIS — J301 Allergic rhinitis due to pollen: Secondary | ICD-10-CM | POA: Diagnosis not present

## 2021-12-26 DIAGNOSIS — J3089 Other allergic rhinitis: Secondary | ICD-10-CM | POA: Diagnosis not present

## 2021-12-28 DIAGNOSIS — J301 Allergic rhinitis due to pollen: Secondary | ICD-10-CM | POA: Diagnosis not present

## 2021-12-28 DIAGNOSIS — J3089 Other allergic rhinitis: Secondary | ICD-10-CM | POA: Diagnosis not present

## 2021-12-29 ENCOUNTER — Other Ambulatory Visit: Payer: Self-pay | Admitting: Family Medicine

## 2021-12-29 DIAGNOSIS — G4700 Insomnia, unspecified: Secondary | ICD-10-CM

## 2021-12-29 DIAGNOSIS — F419 Anxiety disorder, unspecified: Secondary | ICD-10-CM

## 2021-12-30 NOTE — Telephone Encounter (Signed)
Patient is requesting a refill of the following medications: Requested Prescriptions   Pending Prescriptions Disp Refills   clonazePAM (KLONOPIN) 0.5 MG tablet [Pharmacy Med Name: CLONAZEPAM 0.5 MG TABLET] 30 tablet 1    Sig: TAKE 1 TABLET BY MOUTH EVERY DAY    Date of patient request: 12/30/21 Last office visit: 11/04/21 Date of last refill: 10/19/21 Last refill amount: 30

## 2021-12-30 NOTE — Telephone Encounter (Signed)
Medication discussed March 23.  Controlled substance database reviewed last filled 11/22/2021 for #30, refill ordered.

## 2021-12-31 ENCOUNTER — Encounter: Payer: Medicare HMO | Admitting: Family Medicine

## 2022-01-05 ENCOUNTER — Ambulatory Visit (INDEPENDENT_AMBULATORY_CARE_PROVIDER_SITE_OTHER): Payer: Medicare HMO

## 2022-01-05 DIAGNOSIS — R55 Syncope and collapse: Secondary | ICD-10-CM | POA: Diagnosis not present

## 2022-01-05 DIAGNOSIS — J301 Allergic rhinitis due to pollen: Secondary | ICD-10-CM | POA: Diagnosis not present

## 2022-01-05 DIAGNOSIS — I341 Nonrheumatic mitral (valve) prolapse: Secondary | ICD-10-CM | POA: Diagnosis not present

## 2022-01-05 DIAGNOSIS — J3081 Allergic rhinitis due to animal (cat) (dog) hair and dander: Secondary | ICD-10-CM | POA: Diagnosis not present

## 2022-01-05 DIAGNOSIS — J3089 Other allergic rhinitis: Secondary | ICD-10-CM | POA: Diagnosis not present

## 2022-01-05 LAB — ECHOCARDIOGRAM COMPLETE
AR max vel: 1.72 cm2
AV Area VTI: 1.88 cm2
AV Area mean vel: 1.79 cm2
AV Mean grad: 3 mmHg
AV Peak grad: 6.7 mmHg
Ao pk vel: 1.29 m/s
Area-P 1/2: 5.13 cm2
S' Lateral: 2.55 cm
Single Plane A4C EF: 59.8 %

## 2022-01-06 ENCOUNTER — Ambulatory Visit (INDEPENDENT_AMBULATORY_CARE_PROVIDER_SITE_OTHER): Payer: Medicare HMO | Admitting: *Deleted

## 2022-01-06 DIAGNOSIS — Z0000 Encounter for general adult medical examination without abnormal findings: Secondary | ICD-10-CM

## 2022-01-06 NOTE — Progress Notes (Signed)
Subjective:   Tracey Ayala is a 71 y.o. female who presents for Medicare Annual (Subsequent) preventive examination.  I connected with  Tracey Ayala on 01/06/22 by a telephone enabled telemedicine application and verified that I am speaking with the correct person using two identifiers.   I discussed the limitations of evaluation and management by telemedicine. The patient expressed understanding and agreed to proceed.  Patient location: home  Provider location: Tele-health-home    Review of Systems     Cardiac Risk Factors include: advanced age (>58mn, >>92women);hypertension     Objective:    Today's Vitals   There is no height or weight on file to calculate BMI.     01/06/2022   11:34 AM 11/04/2021    4:06 PM 12/25/2020    4:15 PM 12/07/2020    9:17 AM 12/01/2020   10:25 AM  Advanced Directives  Does Patient Have a Medical Advance Directive? Yes No Yes Yes No  Type of [place]ive [place]     Does patient want to make changes to medical advance directive?    No - Patient declined   [place] of [place]? Yes - validated most recent copy scanned in chart (See row information)      Would patient like information on creating a medical advance directive?  No - Patient declined  No - Patient declined Yes (MAU/Ambulatory/Procedural Areas - Information given)    Current Medications (verified) Outpatient Encounter Medications as of 01/06/2022  Medication Sig   atorvastatin (LIPITOR) 10 MG tablet atorvastatin 10 mg tablet   10 mg every day by oral route.   Cholecalciferol (VITAMIN D) 50 MCG (2000 UT) CAPS Take 2,000 Units by mouth daily. Vitamin d 3   citalopram (CELEXA) 20 MG tablet Take 1 tablet (20 mg total) by mouth every evening.   clonazePAM (KLONOPIN) 0.5 MG tablet TAKE 1 TABLET BY MOUTH EVERY DAY   EPINEPHrine 0.3 mg/0.3 mL IJ SOAJ injection Inject 0.3 mg into the muscle as needed for anaphylaxis.    lisinopril (ZESTRIL) 10 MG tablet Take 1 tablet (10 mg total) by mouth daily.   Menaquinone-7 (VITAMIN K2 PO) Take 1 tablet by mouth daily.   Omega-3 Fatty Acids (FISH OIL) 1000 MG CAPS Take 1 capsule by mouth daily.   PRESCRIPTION MEDICATION Allergy Injections once a week.   vitamin C (ASCORBIC ACID) 500 MG tablet Take 500 mg by mouth daily.   Zinc 25 MG TABS Take 25 mg by mouth 3 (three) times a week.   No facility-administered encounter medications on file as of 01/06/2022.    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/15/2020   Hypercholesterolemia    Hypertension    Mitral valve prolapse 12/23/2021   Syncope and collapse 12/23/2021   Wears glasses    Past Surgical History:  Procedure Laterality Date   basal cell carcinoma from left shoulder  2018   mohs procedure  2014   right nostril   ovarian cystectomy and appendectomy Left 1978   ROBOTIC ASSISTED SALPINGO OOPHERECTOMY Bilateral 12/07/2020   Procedure: XI ROBOTIC ASSISTED BILATERAL SALPINGO OOPHORECTOMY,  MINI LAPAROTOMY, PELVIC WASHINGS;  Surgeon: Lafonda Mosses, MD;  Location: [facility];  Service: Gynecology;  Laterality: Bilateral;   WISDOM TOOTH EXTRACTION  1974   Family History  Problem Relation Age of Onset   Stroke Mother    Hypertension Mother    Hyperlipidemia Mother    Diabetes Mother    Stroke Father    Early death Father    Hypertension Father    Hyperlipidemia Father    Arthritis Sister    Cancer Sister        carcinoid tumor of the appendix   Colon cancer Maternal Aunt    Breast cancer Maternal Aunt    Heart failure Maternal Grandmother    Stroke Maternal Grandmother    Endometrial cancer  Neg Hx    Ovarian cancer Neg Hx    Pancreatic cancer Neg Hx    Prostate cancer Neg Hx    Social History   Socioeconomic History   Marital status: Widowed    Spouse name: Not on file   Number of children: Not on file   Years of education: Not on file   Highest education level: Not on file  Occupational History   Occupation: retired  Tobacco Use   Smoking status: Never   Smokeless tobacco: Never  Vaping Use   Vaping Use: Never used  Substance and Sexual Activity   Alcohol use: Never   Drug use: Never   Sexual activity: Not Currently  Other Topics Concern   Not on file  Social History Narrative   Not on file   Social Determinants of Health   Financial Resource Strain: Low Risk    Difficulty of Paying Living Expenses: Not hard at all  Food Insecurity: No Food Insecurity   Worried About Charity fundraiser in the Last Year: Never true   Foster in the Last Year: Never true  Transportation Needs: No Transportation Needs   Lack of Transportation (Medical): No   Lack of Transportation (Non-Medical): No  Physical Activity: Sufficiently Active   Days of Exercise per Week: 5 days   Minutes of Exercise per Session: 60 min  Stress: No Stress Concern Present   Feeling of Stress : Not at all  Social Connections: Moderately Isolated   Frequency of Communication with Friends and Family: More than three times a week   Frequency of Social Gatherings with Friends and Family: More than three times a week   Attends Religious Services: Never   Marine scientist or Organizations: Yes   Attends Music therapist: More than 4 times per year   Marital Status: Widowed    Tobacco Counseling Counseling given: Not Answered   Clinical Intake:  Pre-visit preparation completed: Yes  Pain : No/denies pain     Nutritional Risks: None Diabetes: No  How often do you need to have someone help you when you read instructions, pamphlets, or other written materials  from your doctor or pharmacy?: 1 - Never  Diabetic?  no  Interpreter Needed?: No  Information entered by :: Leroy Kennedy LPN   Activities of Daily Living    01/06/2022   11:38 AM  In your present state of health, do you have any difficulty performing the following activities:  Hearing? 0  Vision? 0  Difficulty concentrating or making decisions? 0  Walking or climbing stairs? 0  Dressing or bathing? 0  Doing errands, shopping? 0  Preparing Food and eating ? N  Using the  Toilet? N  In the past six months, have you accidently leaked urine? N  Do you have problems with loss of bowel control? N  Managing your Medications? N  Managing your Finances? N  Housekeeping or managing your Housekeeping? N    Patient Care Team: Wendie Agreste, MD as PCP - General (Family Medicine) Lavonna Monarch, MD as Consulting Physician (Dermatology)  Indicate any recent Medical Services you may have received from other than [facility] providers in the past year (date may be approximate).     Assessment:   This is a routine wellness examination for [place].  Hearing/Vision screen Hearing Screening - Comments:: No trouble hearing Vision Screening - Comments:: Up to date Dr. Nicki Reaper  Dietary issues and exercise activities discussed: Current Exercise Habits: Home exercise routine;Structured exercise class, Type of exercise: walking;strength training/weights, Time (Minutes): 60, Frequency (Times/Week): 5, Weekly Exercise (Minutes/Week): 300, Intensity: Moderate, Exercise limited by: None identified   Goals Addressed             This Visit's Progress    Patient Stated       Keep working on bone building exercise        Depression Screen    01/06/2022   11:41 AM 11/04/2021   10:42 AM 07/30/2021    9:20 AM  PHQ 2/9 Scores  PHQ - 2 Score 0 0 0  PHQ- 9 Score   1    Fall Risk    01/06/2022   11:34 AM 11/04/2021   10:42 AM 07/30/2021    9:21 AM  [place] in the past year? 0 0 0   Number falls in past yr: 0 0 0  Injury with Fall? 0 0 0  Risk for fall due to :  No Fall Risks No Fall Risks  Follow up Falls evaluation completed;Education provided;Falls prevention discussed Falls evaluation completed Falls evaluation completed    FALL RISK PREVENTION PERTAINING TO THE HOME:  Any stairs in or around the home? No  If so, are there any without handrails? No  Home free of loose throw rugs in walkways, pet beds, electrical cords, etc? Yes  Adequate lighting in your home to reduce risk of falls? Yes   ASSISTIVE DEVICES UTILIZED TO PREVENT FALLS:  Life alert? No  Use of a cane, walker or w/c? No  Grab bars in the bathroom? Yes  Shower chair or bench in shower? No  Elevated toilet seat or a handicapped toilet? No   TIMED UP AND GO:  Was the test performed? No .    Cognitive Function:        01/06/2022   11:35 AM  6CIT Screen  What Year? 0 points  What month? 0 points  What time? 0 points  Count back from 20 0 points  Months in reverse 0 points  Repeat phrase 0 points  Total Score 0 points    Immunizations Immunization History  Administered Date(s) Administered   Influenza Split 06/25/2009, 05/18/2010, 05/23/2012, 06/13/2014, 05/18/2016   Influenza, High Dose Seasonal PF 06/22/2017, 06/28/2018, 06/04/2019, 06/24/2019, 06/23/2020   Influenza,inj,Quad PF,6+ Mos 05/22/2013   Influenza,inj,quad, With Preservative 06/01/2015   Pneumococcal Conjugate-13 02/01/2017   Pneumococcal Polysaccharide-23 06/22/2017, 04/05/2018, 06/28/2018, 06/23/2020   Td 04/05/2018   Tdap 11/06/2008, 08/03/2018   Zoster, Live 01/02/2012, 04/28/2018, 08/26/2018    TDAP status: Up to date  Flu Vaccine status: Due, Education has been provided regarding the importance of this vaccine. Advised may receive this vaccine at local pharmacy or  Health Dept. Aware to provide a copy of the vaccination record if obtained from local pharmacy or Health Dept. Verbalized acceptance and  understanding.  Pneumococcal vaccine status: Up to date  Covid-19 vaccine status: Declined, Education has been provided regarding the importance of this vaccine but patient still declined. Advised may receive this vaccine at local pharmacy or Health Dept.or vaccine clinic. Aware to provide a copy of the vaccination record if obtained from local pharmacy or Health Dept. Verbalized acceptance and understanding.  Qualifies for Shingles Vaccine? No   Zostavax completed Yes   Shingrix Completed?: Yes  Screening Tests Health Maintenance  Topic Date Due   DEXA SCAN  Never done   COVID-19 Vaccine (1) 01/22/2022 (Originally 02/10/1952)   Zoster Vaccines- Shingrix (1 of 2) 02/04/2022 (Originally [birth year]1970)   Hepatitis C Screening  11/05/2022 (Originally 08/11/1969)   INFLUENZA VACCINE  03/15/2022   MAMMOGRAM  02/13/2023   TETANUS/TDAP  08/03/2028   COLONOSCOPY (Pts 45-26yr Insurance coverage will need to be confirmed)  01/19/2030   Pneumonia Vaccine 71 Years old  Completed   HPV VACCINES  Aged Out    Health Maintenance  Health Maintenance Due  Topic Date Due   DEXA SCAN  Never done    Colorectal cancer screening: Type of screening: Colonoscopy. Completed 2021. Repeat every 5 years  Mammogram status: Completed  . Repeat every year  Bone Density will schedule next year with mammogram  Lung Cancer Screening: (Low Dose CT Chest recommended if Age 158-80years, 30 pack-year currently smoking OR have quit w/in 15years.) does not qualify.   Lung Cancer Screening Referral:   Additional Screening:  Hepatitis C Screening:  qualify;   Vision Screening: Recommended annual ophthalmology exams for early detection of glaucoma and other disorders of the eye. Is the patient up to date with their annual eye exam?  Yes  Who is the provider or what is the name of the office in which the patient attends annual eye exams? Dr. SNicki ReaperIf pt is not established with a provider, would they like to be  referred to a provider to establish care? No .   Dental Screening: Recommended annual dental exams for proper oral hygiene  Community Resource Referral / Chronic Care Management: CRR required this visit?  No   CCM required this visit?  No      Plan:     I have personally reviewed and noted the following in the patient's chart:   Medical and social history Use of alcohol, tobacco or illicit drugs  Current medications and supplements including opioid prescriptions.  Functional ability and status Nutritional status Physical activity Advanced directives List of other physicians Hospitalizations, surgeries, and ER visits in previous 12 months Vitals Screenings to include cognitive, depression, and falls Referrals and appointments  In addition, I have reviewed and discussed with patient certain preventive protocols, quality metrics, and best practice recommendations. A written personalized care plan for preventive services as well as general preventive health recommendations were provided to patient.     JLeroy Kennedy LPN   [id number]  Nurse Notes:

## 2022-01-06 NOTE — Patient Instructions (Signed)
Tracey Ayala , Thank you for taking time to come for your Medicare Wellness Visit. I appreciate your ongoing commitment to your health goals. Please review the following plan we discussed and let me know if I can assist you in the future.   Screening recommendations/referrals: Colonoscopy: up to date Mammogram: up to date Bone Density: Education provided Recommended yearly ophthalmology/optometry visit for glaucoma screening and checkup Recommended yearly dental visit for hygiene and checkup  Vaccinations: Influenza vaccine: Education provided Pneumococcal vaccine: up to date Tdap vaccine: up to date Shingles vaccine: up to date    Advanced directives: on file  Conditions/risks identified:   Next appointment: 02-03-2022 @ 11:00  [place] 65 Years and Older, Female Preventive care refers to lifestyle choices and visits with your health care provider that can promote health and wellness. What does preventive care include? A yearly physical exam. This is also called an annual well check. Dental exams once or twice a year. Routine eye exams. Ask your health care provider how often you should have your eyes checked. Personal lifestyle choices, including: Daily care of your teeth and gums. Regular physical activity. Eating a healthy diet. Avoiding tobacco and drug use. Limiting alcohol use. Practicing safe sex. Taking low-dose aspirin every day. Taking vitamin and mineral supplements as recommended by your health care provider. What happens during an annual well check? The services and screenings done by your health care provider during your annual well check will depend on your age, overall health, lifestyle risk factors, and family history of disease. Counseling  Your health care provider may ask you questions about your: Alcohol use. Tobacco use. Drug use. Emotional well-being. Home and relationship well-being. Sexual activity. Eating habits. History of  falls. Memory and ability to understand (cognition). Work and work Statistician. Reproductive health. Screening  You may have the following tests or measurements: Height, weight, and BMI. Blood pressure. Lipid and cholesterol levels. These may be checked every 5 years, or more frequently if you are over 41 years old. Skin check. Lung cancer screening. You may have this screening every year starting at age 63 if you have a 30-pack-year history of smoking and currently smoke or have quit within the past 15 years. Fecal occult blood test (FOBT) of the stool. You may have this test every year starting at age 67. Flexible sigmoidoscopy or colonoscopy. You may have a sigmoidoscopy every 5 years or a colonoscopy every 10 years starting at age 47. Hepatitis C blood test. Hepatitis B blood test. Sexually transmitted disease (STD) testing. Diabetes screening. This is done by checking your blood sugar (glucose) after you have not eaten for a while (fasting). You may have this done every 1-3 years. Bone density scan. This is done to screen for osteoporosis. You may have this done starting at age 71. Mammogram. This may be done every 1-2 years. Talk to your health care provider about how often you should have regular mammograms. Talk with your health care provider about your test results, treatment options, and if necessary, the need for more tests. Vaccines  Your health care provider may recommend certain vaccines, such as: Influenza vaccine. This is recommended every year. Tetanus, diphtheria, and acellular pertussis (Tdap, Td) vaccine. You may need a Td booster every 10 years. Zoster vaccine. You may need this after age 42. Pneumococcal 13-valent conjugate (PCV13) vaccine. One dose is recommended after age 62. Pneumococcal polysaccharide (PPSV23) vaccine. One dose is recommended after age 68. Talk to your health care provider  about which screenings and vaccines you need and how often you need  them. This information is not intended to replace advice given to you by your health care provider. Make sure you discuss any questions you have with your health care provider. Document Released: 08/28/2015 Document Revised: 04/20/2016 Document Reviewed: 06/02/2015 Elsevier Interactive Patient Education  2017 [place] Prevention in the Home Falls can cause injuries. They can happen to people of all ages. There are many things you can do to make your home safe and to help prevent falls. What can I do on the outside of my home? Regularly fix the edges of walkways and driveways and fix any cracks. Remove anything that might make you trip as you walk through a door, such as a raised step or threshold. Trim any bushes or trees on the path to your home. Use bright outdoor lighting. Clear any walking paths of anything that might make someone trip, such as rocks or tools. Regularly check to see if handrails are loose or broken. Make sure that both sides of any steps have handrails. Any raised decks and porches should have guardrails on the edges. Have any leaves, snow, or ice cleared regularly. Use sand or salt on walking paths during winter. Clean up any spills in your garage right away. This includes oil or grease spills. What can I do in the bathroom? Use night lights. Install grab bars by the toilet and in the tub and shower. Do not use towel bars as grab bars. Use non-skid mats or decals in the tub or shower. If you need to sit down in the shower, use a plastic, non-slip stool. Keep the floor dry. Clean up any water that spills on the floor as soon as it happens. Remove soap buildup in the tub or shower regularly. Attach bath mats securely with double-sided non-slip rug tape. Do not have throw rugs and other things on the floor that can make you trip. What can I do in the bedroom? Use night lights. Make sure that you have a light by your bed that is easy to reach. Do not use  any sheets or blankets that are too big for your bed. They should not hang down onto the floor. Have a firm chair that has side arms. You can use this for support while you get dressed. Do not have throw rugs and other things on the floor that can make you trip. What can I do in the kitchen? Clean up any spills right away. Avoid walking on wet floors. Keep items that you use a lot in easy-to-reach places. If you need to reach something above you, use a strong step stool that has a grab bar. Keep electrical cords out of the way. Do not use floor polish or wax that makes floors slippery. If you must use wax, use non-skid floor wax. Do not have throw rugs and other things on the floor that can make you trip. What can I do with my stairs? Do not leave any items on the stairs. Make sure that there are handrails on both sides of the stairs and use them. Fix handrails that are broken or loose. Make sure that handrails are as long as the stairways. Check any carpeting to make sure that it is firmly attached to the stairs. Fix any carpet that is loose or worn. Avoid having throw rugs at the top or bottom of the stairs. If you do have throw rugs, attach them to the floor  with carpet tape. Make sure that you have a light switch at the top of the stairs and the bottom of the stairs. If you do not have them, ask someone to add them for you. What else can I do to help prevent falls? Wear shoes that: Do not have high heels. Have rubber bottoms. Are comfortable and fit you well. Are closed at the toe. Do not wear sandals. If you use a stepladder: Make sure that it is fully opened. Do not climb a closed stepladder. Make sure that both sides of the stepladder are locked into place. Ask someone to hold it for you, if possible. Clearly mark and make sure that you can see: Any grab bars or handrails. First and last steps. Where the edge of each step is. Use tools that help you move around (mobility aids)  if they are needed. These include: Canes. Walkers. Scooters. Crutches. Turn on the lights when you go into a dark area. Replace any light bulbs as soon as they burn out. Set up your furniture so you have a clear path. Avoid moving your furniture around. If any of your floors are uneven, fix them. If there are any pets around you, be aware of where they are. Review your medicines with your doctor. Some medicines can make you feel dizzy. This can increase your chance of falling. Ask your doctor what other things that you can do to help prevent falls. This information is not intended to replace advice given to you by your health care provider. Make sure you discuss any questions you have with your health care provider. Document Released: 05/28/2009 Document Revised: 01/07/2016 Document Reviewed: 09/05/2014 Elsevier Interactive Patient Education  2017 [employer].

## 2022-01-11 DIAGNOSIS — J3089 Other allergic rhinitis: Secondary | ICD-10-CM | POA: Diagnosis not present

## 2022-01-11 DIAGNOSIS — J301 Allergic rhinitis due to pollen: Secondary | ICD-10-CM | POA: Diagnosis not present

## 2022-01-11 DIAGNOSIS — J3081 Allergic rhinitis due to animal (cat) (dog) hair and dander: Secondary | ICD-10-CM | POA: Diagnosis not present

## 2022-01-18 DIAGNOSIS — J301 Allergic rhinitis due to pollen: Secondary | ICD-10-CM | POA: Diagnosis not present

## 2022-01-18 DIAGNOSIS — J3081 Allergic rhinitis due to animal (cat) (dog) hair and dander: Secondary | ICD-10-CM | POA: Diagnosis not present

## 2022-01-18 DIAGNOSIS — J3089 Other allergic rhinitis: Secondary | ICD-10-CM | POA: Diagnosis not present

## 2022-01-21 ENCOUNTER — Other Ambulatory Visit: Payer: Self-pay | Admitting: Family Medicine

## 2022-01-21 ENCOUNTER — Telehealth: Payer: Self-pay | Admitting: Family Medicine

## 2022-01-21 DIAGNOSIS — E785 Hyperlipidemia, unspecified: Secondary | ICD-10-CM

## 2022-01-21 NOTE — Telephone Encounter (Signed)
Patient needs a refill for atorvastatin (LIPITOR) 10 MG tablet Patient has enough to get her through the weekend.   [facility] on file confirmed-

## 2022-01-25 DIAGNOSIS — J301 Allergic rhinitis due to pollen: Secondary | ICD-10-CM | POA: Diagnosis not present

## 2022-01-25 DIAGNOSIS — J3081 Allergic rhinitis due to animal (cat) (dog) hair and dander: Secondary | ICD-10-CM | POA: Diagnosis not present

## 2022-01-25 DIAGNOSIS — J3089 Other allergic rhinitis: Secondary | ICD-10-CM | POA: Diagnosis not present

## 2022-01-27 ENCOUNTER — Other Ambulatory Visit: Payer: Self-pay | Admitting: Family Medicine

## 2022-01-27 DIAGNOSIS — E785 Hyperlipidemia, unspecified: Secondary | ICD-10-CM

## 2022-02-01 DIAGNOSIS — J3081 Allergic rhinitis due to animal (cat) (dog) hair and dander: Secondary | ICD-10-CM | POA: Diagnosis not present

## 2022-02-01 DIAGNOSIS — J3089 Other allergic rhinitis: Secondary | ICD-10-CM | POA: Diagnosis not present

## 2022-02-01 DIAGNOSIS — J301 Allergic rhinitis due to pollen: Secondary | ICD-10-CM | POA: Diagnosis not present

## 2022-02-03 ENCOUNTER — Ambulatory Visit (INDEPENDENT_AMBULATORY_CARE_PROVIDER_SITE_OTHER): Payer: Medicare HMO | Admitting: Family Medicine

## 2022-02-03 VITALS — BP 122/80 | HR 70 | Temp 97.8°F | Resp 16 | Ht 62.5 in | Wt 108.2 lb

## 2022-02-03 DIAGNOSIS — M8580 Other specified disorders of bone density and structure, unspecified site: Secondary | ICD-10-CM

## 2022-02-03 DIAGNOSIS — F419 Anxiety disorder, unspecified: Secondary | ICD-10-CM | POA: Diagnosis not present

## 2022-02-03 DIAGNOSIS — G4700 Insomnia, unspecified: Secondary | ICD-10-CM | POA: Diagnosis not present

## 2022-02-03 DIAGNOSIS — E785 Hyperlipidemia, unspecified: Secondary | ICD-10-CM | POA: Diagnosis not present

## 2022-02-03 DIAGNOSIS — R69 Illness, unspecified: Secondary | ICD-10-CM | POA: Diagnosis not present

## 2022-02-03 DIAGNOSIS — I10 Essential (primary) hypertension: Secondary | ICD-10-CM

## 2022-02-03 LAB — COMPREHENSIVE METABOLIC PANEL
ALT: 25 U/L (ref 0–35)
AST: 30 U/L (ref 0–37)
Albumin: 4.3 g/dL (ref 3.5–5.2)
Alkaline Phosphatase: 63 U/L (ref 39–117)
BUN: 13 mg/dL (ref 6–23)
CO2: 34 mEq/L — ABNORMAL HIGH (ref 19–32)
Calcium: 9.7 mg/dL (ref 8.4–10.5)
Chloride: 102 mEq/L (ref 96–112)
Creatinine, Ser: 0.73 mg/dL (ref 0.40–1.20)
GFR: 83.29 mL/min (ref >60.00)
Glucose, Bld: 103 mg/dL — ABNORMAL HIGH (ref 70–99)
Potassium: 4 mEq/L (ref 3.5–5.1)
Sodium: 141 mEq/L (ref 135–145)
Total Bilirubin: 0.9 mg/dL (ref 0.2–1.2)
Total Protein: 6.7 g/dL (ref 6.0–8.3)

## 2022-02-03 LAB — LIPID PANEL
Cholesterol: 206 mg/dL — ABNORMAL HIGH (ref 0–200)
HDL: 90.8 mg/dL (ref >39.00)
LDL Cholesterol: 100 mg/dL — ABNORMAL HIGH (ref 0–99)
NonHDL: 115.56
Total CHOL/HDL Ratio: 2
Triglycerides: 79 mg/dL (ref 0.0–149.0)
VLDL: 15.8 mg/dL (ref 0.0–40.0)

## 2022-02-03 LAB — VITAMIN D 25 HYDROXY (VIT D DEFICIENCY, FRACTURES): VITD: 76.29 ng/mL (ref 30.00–100.00)

## 2022-02-03 MED ORDER — LISINOPRIL 10 MG PO TABS: 10.0000 mg | ORAL_TABLET | Freq: Every day | ORAL | 1 refills | Status: DC

## 2022-02-03 MED ORDER — ATORVASTATIN CALCIUM 10 MG PO TABS: ORAL_TABLET | ORAL | 3 refills | Status: DC

## 2022-02-03 MED ORDER — CITALOPRAM HYDROBROMIDE 20 MG PO TABS: 20.0000 mg | ORAL_TABLET | Freq: Every evening | ORAL | 1 refills | Status: DC

## 2022-02-03 MED ORDER — BELSOMRA 10 MG PO TABS: 10.0000 mg | ORAL_TABLET | Freq: Every evening | ORAL | 0 refills | Status: DC | PRN

## 2022-02-03 NOTE — Progress Notes (Signed)
Subjective:  Patient ID: Tracey Ayala, female    DOB: 1951-05-15  Age: 71 y.o. MRN: [id number]  CC:  Chief Complaint  Patient presents with   Hypertension   Depression   Labs Only    Check vitamin D level     HPI TINSLEIGH SLOVACEK presents for    Hypertension: Lisinopril 10 mg daily Home readings: BP Readings from Last 3 Encounters:  02/03/22 122/80  12/23/21 118/70  11/04/21 (!) 151/85   Lab Results  Component Value Date   CREATININE 0.77 11/04/2021   Hyperlipidemia: Lipitor 10 mg daily, no new side effects/myalgias.  Fasting today.  Lab Results  Component Value Date   CHOL 196 07/30/2021   HDL 97.60 07/30/2021   LDLCALC 86 07/30/2021   TRIG 60.0 07/30/2021   CHOLHDL 2 07/30/2021   Lab Results  Component Value Date   ALT 20 07/30/2021   AST 25 07/30/2021   ALKPHOS 63 07/30/2021   BILITOT 0.8 07/30/2021    Requests vitamin D level. Bone density testing due in 2024. Osteopenia. Repeat exam every 2 years.  Prior vitamin D supplement.  Stopped with dark rash under arms. Rash resolved off supplement - now only once per week. 2000iu.  Calcium in diet, no supplements.   Anxiety: Celexa 20 mg daily, clonazepam as needed.  Persistent insomnia for years. No side effects on klonopin. Controlled substance database reviewed.  Last filled #30 on May 18, previously April 10, March 7, February 6, January 6.  Did not tolerate hydroxyzine full dose  - felt like had caffeine, option of half dosing discussed in March. Unable to split hydroxyzine.  Anxiety well controlled.  Unable to sleep with 1/2 dose of klonopin, or with hydroxyzine trial again. Trouble getting to sleep and staying asleep.  No relief with melatonin in past.   Few episodes of dysuria past few weeks, not in past week. Rtc precautions given if returns.      02/03/2022   11:17 AM 01/06/2022   11:41 AM 11/04/2021   10:42 AM 07/30/2021    9:20 AM  Depression screen PHQ 2/9  Decreased Interest 0 0 0 0   Down, Depressed, Hopeless 0 0 0 0  PHQ - 2 Score 0 0 0 0  Altered sleeping    1  Tired, decreased energy    0  Change in appetite    0  Feeling bad or failure about yourself     0  Trouble concentrating    0  Moving slowly or fidgety/restless    0  Suicidal thoughts    0  PHQ-9 Score    1  Difficult doing work/chores    Not difficult at all      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   Past Medical History:  Diagnosis Date   Adnexal mass    Allergy    Receives injections once a week   Anxiety    Basal cell carcinoma 03/04/2013   bcc right nostril =mohs   Cataract    Endometriosis    Essential hypertension 12/23/2021   Fibroma of right ovary 12/15/2020   Hypercholesterolemia    Hypertension    Mitral valve prolapse 12/23/2021   Syncope  and collapse 12/23/2021   Wears glasses    Past Surgical History:  Procedure Laterality Date   basal cell carcinoma from left shoulder  2018   mohs procedure  2014   right nostril   ovarian cystectomy and appendectomy Left 1978   ROBOTIC ASSISTED SALPINGO OOPHERECTOMY Bilateral 12/07/2020   Procedure: XI ROBOTIC ASSISTED BILATERAL SALPINGO OOPHORECTOMY, MINI LAPAROTOMY, PELVIC WASHINGS;  Surgeon: Lafonda Mosses, MD;  Location: [facility];  Service: Gynecology;  Laterality: Bilateral;   WISDOM TOOTH EXTRACTION  1974   Allergies  Allergen Reactions   Codeine Nausea Only    Sick  Other reaction(s): GI upset nausea   Fosamax [Alendronate] Rash   Prior to Admission medications   Medication Sig Start Date End Date Taking? Authorizing Provider  atorvastatin (LIPITOR) 10 MG tablet TAKE 1 TABLET BY MOUTH EVERY DAY 01/27/22  Yes  Wendie Agreste, MD  Cholecalciferol (VITAMIN D) 50 MCG (2000 UT) CAPS Take 2,000 Units by mouth daily. Vitamin d 3   Yes [provider]  citalopram (CELEXA) 20 MG tablet Take 1 tablet (20 mg total) by mouth every evening. 11/04/21  Yes Wendie Agreste, MD  clonazePAM (KLONOPIN) 0.5 MG tablet TAKE 1 TABLET BY MOUTH EVERY DAY 12/30/21  Yes Wendie Agreste, MD  EPINEPHrine 0.3 mg/0.3 mL IJ SOAJ injection Inject 0.3 mg into the muscle as needed for anaphylaxis.   Yes [provider]  lisinopril (ZESTRIL) 10 MG tablet Take 1 tablet (10 mg total) by mouth daily. 11/04/21  Yes Wendie Agreste, MD  Menaquinone-7 (VITAMIN K2 PO) Take 1 tablet by mouth daily.   Yes [provider]  Omega-3 Fatty Acids (FISH OIL) 1000 MG CAPS Take 1 capsule by mouth daily.   Yes [provider]  PRESCRIPTION MEDICATION Allergy Injections once a week.   Yes [provider]  vitamin C (ASCORBIC ACID) 500 MG tablet Take 500 mg by mouth daily.    [provider]  Zinc 25 MG TABS Take 25 mg by mouth 3 (three) times a week.    [provider]   Social History   Socioeconomic History   Marital status: Widowed    Spouse name: Not on file   Number of children: Not on file   Years of education: Not on file   Highest education level: Not on file  Occupational History   Occupation: retired  Tobacco Use   Smoking status: Never   Smokeless tobacco: Never  Vaping Use   Vaping Use: Never used  Substance and Sexual Activity   Alcohol use: Never   Drug use: Never   Sexual activity: Not Currently  Other Topics Concern   Not on file  Social History Narrative   Not on file   Social Determinants of Health   Financial Resource Strain: Low Risk  (01/06/2022)   Overall Financial Resource Strain (CARDIA)    Difficulty of Paying Living Expenses: Not hard at all  Food Insecurity: No Food Insecurity (01/06/2022)   Hunger Vital Sign    Worried About Running Out of  Food in the Last Year: Never true    [place] in the Last Year: Never true  Transportation Needs: No Transportation Needs (01/06/2022)   PRAPARE - Hydrologist (Medical): No    Lack of Transportation (Non-Medical): No  Physical Activity: Sufficiently Active (01/06/2022)   Exercise Vital Sign    Days of Exercise per Week: 5 days  Minutes of Exercise per Session: 60 min  Stress: No Stress Concern Present (01/06/2022)   [place]    Feeling of Stress : Not at all  Social Connections: Moderately Isolated (01/06/2022)   Social Connection and Isolation Panel [NHANES]    Frequency of Communication with Friends and Family: More than three times a week    Frequency of Social Gatherings with Friends and Family: More than three times a week    Attends Religious Services: Never    Marine scientist or Organizations: Yes    Attends Music therapist: More than 4 times per year    Marital Status: Widowed  Intimate Partner Violence: Not At Risk (01/06/2022)   Humiliation, Afraid, Rape, and Kick questionnaire    Fear of Current or Ex-Partner: No    Emotionally Abused: No    Physically Abused: No    Sexually Abused: No    Review of Systems  Constitutional:  Negative for fatigue and unexpected weight change.  Respiratory:  Negative for chest tightness and shortness of breath.   Cardiovascular:  Negative for chest pain, palpitations and leg swelling.  Gastrointestinal:  Negative for abdominal pain and blood in stool.  Neurological:  Negative for dizziness, syncope, light-headedness and headaches.     Objective:   Vitals:   02/03/22 1114  BP: 122/80  Pulse: 70  Resp: 16  Temp: 97.8 F (36.6 C)  TempSrc: Temporal  SpO2: 95%  Weight: 108 lb 3.2 oz (49.1 kg)  Height: 5' 2.5" (1.588 m)     Physical Exam Vitals reviewed.  Constitutional:      General: She is not in acute  distress.    Appearance: She is well-developed.  HENT:     Head: Normocephalic and atraumatic.     Right Ear: Hearing, tympanic membrane, ear canal and external ear normal.     Left Ear: Hearing, tympanic membrane, ear canal and external ear normal.     Nose: Nose normal.     Mouth/Throat:     Pharynx: No posterior oropharyngeal erythema.  Eyes:     Conjunctiva/sclera: Conjunctivae normal.     Pupils: Pupils are equal, round, and reactive to light.  Cardiovascular:     Rate and Rhythm: Normal rate and regular rhythm.     Heart sounds: Normal heart sounds. No murmur heard. Pulmonary:     Effort: Pulmonary effort is normal. No respiratory distress.     Breath sounds: Normal breath sounds. No wheezing or rhonchi.  Abdominal:     General: Abdomen is flat. Bowel sounds are normal.     Tenderness: There is no abdominal tenderness.  Skin:    General: Skin is warm and dry.     Findings: No rash.  Neurological:     Mental Status: She is alert and oriented to person, place, and time.  Psychiatric:        Mood and Affect: Mood normal.        Behavior: Behavior normal.        Assessment & Plan:  ELTON CATALANO is a 71 y.o. female . Insomnia, unspecified type - Plan: Suvorexant (BELSOMRA) 10 MG TABS Anxiety  -Discussed risks with benzodiazepines.  Has been tolerating Klonopin for some time.  No relief with melatonin, did not tolerate hydroxyzine.  Mixed insomnia with sleep onset difficulties as well as waking up in the night.  Handout given on insomnia and discussed sleep hygiene.  Consider other anxiety treatments  if persistent sleep onset issues, trial of Belsomra if that is covered.  If not or side effects, return to Klonopin.  -Reports controlled anxiety at this time, continue Celexa same dose  Hyperlipidemia, unspecified hyperlipidemia type - Plan: citalopram (CELEXA) 20 MG tablet, Comprehensive metabolic panel, Lipid panel  -Tolerating Lipitor, continue same.  Check updated  labs.  Essential hypertension - Plan: lisinopril (ZESTRIL) 10 MG tablet, Comprehensive metabolic panel  -  Stable, tolerating current regimen. Medications refilled. Labs pending as above.   Osteopenia, unspecified location - Plan: VITAMIN D 25 Hydroxy (Vit-D Deficiency, Fractures)  -Calcium through the diet, stop vitamin D temporarily previously due to concern of rash/pigmentation axilla, improved off meds.  Check vitamin D level.  Would consider restart vitamin D and watching for rash recurrence as less likely cause.  Meds ordered this encounter  Medications   citalopram (CELEXA) 20 MG tablet    Sig: Take 1 tablet (20 mg total) by mouth every evening.    Dispense:  90 tablet    Refill:  1   lisinopril (ZESTRIL) 10 MG tablet    Sig: Take 1 tablet (10 mg total) by mouth daily.    Dispense:  90 tablet    Refill:  1   Suvorexant (BELSOMRA) 10 MG TABS    Sig: Take 10 mg by mouth at bedtime as needed.    Dispense:  30 tablet    Refill:  0   Patient Instructions  We can try Belsomra for sleep in place of klonopin if that works ok and is covered. If not, return to clonazepam for now. Let me know if any new side effects. Continue same dose celexa for now.  No other med changes today.   Insomnia Insomnia is a sleep disorder that makes it difficult to fall asleep or stay asleep. Insomnia can cause fatigue, low energy, difficulty concentrating, mood swings, and poor performance at work or school. There are three different ways to classify insomnia: Difficulty falling asleep. Difficulty staying asleep. Waking up too early in the morning. Any type of insomnia can be long-term (chronic) or short-term (acute). Both are common. Short-term insomnia usually lasts for 3 months or less. Chronic insomnia occurs at least three times a week for longer than 3 months. What are the causes? Insomnia may be caused by another condition, situation, or substance, such as: Having certain mental health  conditions, such as anxiety and depression. Using caffeine, alcohol, tobacco, or drugs. Having gastrointestinal conditions, such as gastroesophageal reflux disease (GERD). Having certain medical conditions. These include: Asthma. Alzheimer's disease. Stroke. Chronic pain. An overactive thyroid gland (hyperthyroidism). Other sleep disorders, such as restless legs syndrome and sleep apnea. Menopause. Sometimes, the cause of insomnia may not be known. What increases the risk? Risk factors for insomnia include: Gender. Females are affected more often than males. Age. Insomnia is more common as people get older. Stress and certain medical and mental health conditions. Lack of exercise. Having an irregular work schedule. This may include working night shifts and traveling between different time zones. What are the signs or symptoms? If you have insomnia, the main symptom is having trouble falling asleep or having trouble staying asleep. This may lead to other symptoms, such as: Feeling tired or having low energy. Feeling nervous about going to sleep. Not feeling rested in the morning. Having trouble concentrating. Feeling irritable, anxious, or depressed. How is this diagnosed? This condition may be diagnosed based on: Your symptoms and medical history. Your health care provider may ask  about: Your sleep habits. Any medical conditions you have. Your mental health. A physical exam. How is this treated? Treatment for insomnia depends on the cause. Treatment may focus on treating an underlying condition that is causing the insomnia. Treatment may also include: Medicines to help you sleep. Counseling or therapy. Lifestyle adjustments to help you sleep better. Follow these instructions at home: Eating and drinking  Limit or avoid alcohol, caffeinated beverages, and products that contain nicotine and tobacco, especially close to bedtime. These can disrupt your sleep. Do not eat a large  meal or eat spicy foods right before bedtime. This can lead to digestive discomfort that can make it hard for you to sleep. Sleep habits  Keep a sleep diary to help you and your health care provider figure out what could be causing your insomnia. Write down: When you sleep. When you wake up during the night. How well you sleep and how rested you feel the next day. Any side effects of medicines you are taking. What you eat and drink. Make your bedroom a dark, comfortable place where it is easy to fall asleep. Put up shades or blackout curtains to block light from outside. Use a white noise machine to block noise. Keep the temperature cool. Limit screen use before bedtime. This includes: Not watching TV. Not using your smartphone, tablet, or computer. Stick to a routine that includes going to bed and waking up at the same times every day and night. This can help you fall asleep faster. Consider making a quiet activity, such as reading, part of your nighttime routine. Try to avoid taking naps during the day so that you sleep better at night. Get out of bed if you are still awake after 15 minutes of trying to sleep. Keep the lights down, but try reading or doing a quiet activity. When you feel sleepy, go back to bed. General instructions Take over-the-counter and prescription medicines only as told by your health care provider. Exercise regularly as told by your health care provider. However, avoid exercising in the hours right before bedtime. Use relaxation techniques to manage stress. Ask your health care provider to suggest some techniques that may work well for you. These may include: Breathing exercises. Routines to release muscle tension. Visualizing peaceful scenes. Make sure that you drive carefully. Do not drive if you feel very sleepy. Keep all follow-up visits. This is important. Contact a health care provider if: You are tired throughout the day. You have trouble in your daily  routine due to sleepiness. You continue to have sleep problems, or your sleep problems get worse. Get help right away if: You have thoughts about hurting yourself or someone else. Get help right away if you feel like you may hurt yourself or others, or have thoughts about taking your own life. Go to your nearest emergency room or: Call 911. Call the [facility] at (561) [phone] or 988. This is open 24 hours a day. Text the Crisis Text Line at 219-330-0008. Summary Insomnia is a sleep disorder that makes it difficult to fall asleep or stay asleep. Insomnia can be long-term (chronic) or short-term (acute). Treatment for insomnia depends on the cause. Treatment may focus on treating an underlying condition that is causing the insomnia. Keep a sleep diary to help you and your health care provider figure out what could be causing your insomnia. This information is not intended to replace advice given to you by your health care provider. Make sure you discuss any  questions you have with your health care provider. Document Revised: 07/12/2021 Document Reviewed: 07/12/2021 Elsevier Patient Education  2023 [place],   Merri Ray, MD [place], [facility] 02/03/22 11:59 AM

## 2022-02-03 NOTE — Patient Instructions (Addendum)
We can try Belsomra for sleep in place of klonopin if that works ok and is covered. If not, return to clonazepam for now. Let me know if any new side effects. Continue same dose celexa for now.  No other med changes today.   Insomnia Insomnia is a sleep disorder that makes it difficult to fall asleep or stay asleep. Insomnia can cause fatigue, low energy, difficulty concentrating, mood swings, and poor performance at work or school. There are three different ways to classify insomnia: Difficulty falling asleep. Difficulty staying asleep. Waking up too early in the morning. Any type of insomnia can be long-term (chronic) or short-term (acute). Both are common. Short-term insomnia usually lasts for 3 months or less. Chronic insomnia occurs at least three times a week for longer than 3 months. What are the causes? Insomnia may be caused by another condition, situation, or substance, such as: Having certain mental health conditions, such as anxiety and depression. Using caffeine, alcohol, tobacco, or drugs. Having gastrointestinal conditions, such as gastroesophageal reflux disease (GERD). Having certain medical conditions. These include: Asthma. Alzheimer's disease. Stroke. Chronic pain. An overactive thyroid gland (hyperthyroidism). Other sleep disorders, such as restless legs syndrome and sleep apnea. Menopause. Sometimes, the cause of insomnia may not be known. What increases the risk? Risk factors for insomnia include: Gender. Females are affected more often than males. Age. Insomnia is more common as people get older. Stress and certain medical and mental health conditions. Lack of exercise. Having an irregular work schedule. This may include working night shifts and traveling between different time zones. What are the signs or symptoms? If you have insomnia, the main symptom is having trouble falling asleep or having trouble staying asleep. This may lead to other symptoms, such  as: Feeling tired or having low energy. Feeling nervous about going to sleep. Not feeling rested in the morning. Having trouble concentrating. Feeling irritable, anxious, or depressed. How is this diagnosed? This condition may be diagnosed based on: Your symptoms and medical history. Your health care provider may ask about: Your sleep habits. Any medical conditions you have. Your mental health. A physical exam. How is this treated? Treatment for insomnia depends on the cause. Treatment may focus on treating an underlying condition that is causing the insomnia. Treatment may also include: Medicines to help you sleep. Counseling or therapy. Lifestyle adjustments to help you sleep better. Follow these instructions at home: Eating and drinking  Limit or avoid alcohol, caffeinated beverages, and products that contain nicotine and tobacco, especially close to bedtime. These can disrupt your sleep. Do not eat a large meal or eat spicy foods right before bedtime. This can lead to digestive discomfort that can make it hard for you to sleep. Sleep habits  Keep a sleep diary to help you and your health care provider figure out what could be causing your insomnia. Write down: When you sleep. When you wake up during the night. How well you sleep and how rested you feel the next day. Any side effects of medicines you are taking. What you eat and drink. Make your bedroom a dark, comfortable place where it is easy to fall asleep. Put up shades or blackout curtains to block light from outside. Use a white noise machine to block noise. Keep the temperature cool. Limit screen use before bedtime. This includes: Not watching TV. Not using your smartphone, tablet, or computer. Stick to a routine that includes going to bed and waking up at the same times every day and  night. This can help you fall asleep faster. Consider making a quiet activity, such as reading, part of your nighttime routine. Try to  avoid taking naps during the day so that you sleep better at night. Get out of bed if you are still awake after 15 minutes of trying to sleep. Keep the lights down, but try reading or doing a quiet activity. When you feel sleepy, go back to bed. General instructions Take over-the-counter and prescription medicines only as told by your health care provider. Exercise regularly as told by your health care provider. However, avoid exercising in the hours right before bedtime. Use relaxation techniques to manage stress. Ask your health care provider to suggest some techniques that may work well for you. These may include: Breathing exercises. Routines to release muscle tension. Visualizing peaceful scenes. Make sure that you drive carefully. Do not drive if you feel very sleepy. Keep all follow-up visits. This is important. Contact a health care provider if: You are tired throughout the day. You have trouble in your daily routine due to sleepiness. You continue to have sleep problems, or your sleep problems get worse. Get help right away if: You have thoughts about hurting yourself or someone else. Get help right away if you feel like you may hurt yourself or others, or have thoughts about taking your own life. Go to your nearest emergency room or: Call 911. Call the [facility] at [phone] or 988. This is open 24 hours a day. Text the Crisis Text Line at 816-170-6201. Summary Insomnia is a sleep disorder that makes it difficult to fall asleep or stay asleep. Insomnia can be long-term (chronic) or short-term (acute). Treatment for insomnia depends on the cause. Treatment may focus on treating an underlying condition that is causing the insomnia. Keep a sleep diary to help you and your health care provider figure out what could be causing your insomnia. This information is not intended to replace advice given to you by your health care provider. Make sure you discuss any  questions you have with your health care provider. Document Revised: 07/12/2021 Document Reviewed: 07/12/2021 Elsevier Patient Education  [place].

## 2022-02-08 DIAGNOSIS — J301 Allergic rhinitis due to pollen: Secondary | ICD-10-CM | POA: Diagnosis not present

## 2022-02-08 DIAGNOSIS — J3089 Other allergic rhinitis: Secondary | ICD-10-CM | POA: Diagnosis not present

## 2022-02-08 DIAGNOSIS — J3081 Allergic rhinitis due to animal (cat) (dog) hair and dander: Secondary | ICD-10-CM | POA: Diagnosis not present

## 2022-02-16 DIAGNOSIS — J301 Allergic rhinitis due to pollen: Secondary | ICD-10-CM | POA: Diagnosis not present

## 2022-02-16 DIAGNOSIS — J3089 Other allergic rhinitis: Secondary | ICD-10-CM | POA: Diagnosis not present

## 2022-02-22 DIAGNOSIS — J301 Allergic rhinitis due to pollen: Secondary | ICD-10-CM | POA: Diagnosis not present

## 2022-02-22 DIAGNOSIS — J3081 Allergic rhinitis due to animal (cat) (dog) hair and dander: Secondary | ICD-10-CM | POA: Diagnosis not present

## 2022-02-22 DIAGNOSIS — J3089 Other allergic rhinitis: Secondary | ICD-10-CM | POA: Diagnosis not present

## 2022-03-01 DIAGNOSIS — J301 Allergic rhinitis due to pollen: Secondary | ICD-10-CM | POA: Diagnosis not present

## 2022-03-01 DIAGNOSIS — J3089 Other allergic rhinitis: Secondary | ICD-10-CM | POA: Diagnosis not present

## 2022-03-01 DIAGNOSIS — J3081 Allergic rhinitis due to animal (cat) (dog) hair and dander: Secondary | ICD-10-CM | POA: Diagnosis not present

## 2022-03-02 ENCOUNTER — Telehealth: Payer: Self-pay | Admitting: Cardiovascular Disease

## 2022-03-02 DIAGNOSIS — Z1231 Encounter for screening mammogram for malignant neoplasm of breast: Secondary | ICD-10-CM | POA: Diagnosis not present

## 2022-03-02 LAB — HM MAMMOGRAPHY

## 2022-03-02 NOTE — Telephone Encounter (Signed)
Pt is calling in regards to echo done and letter received. She states she does have questions and would like a call back.

## 2022-03-02 NOTE — Telephone Encounter (Signed)
Discussed echo with patient  Concerned about liver cyst, advised to follow up with PCP if concerned She did not want to do that  Printed information from [facility] and mailed to patient

## 2022-03-03 DIAGNOSIS — L821 Other seborrheic keratosis: Secondary | ICD-10-CM | POA: Diagnosis not present

## 2022-03-03 DIAGNOSIS — L820 Inflamed seborrheic keratosis: Secondary | ICD-10-CM | POA: Diagnosis not present

## 2022-03-08 ENCOUNTER — Encounter: Payer: Self-pay | Admitting: Family Medicine

## 2022-03-09 DIAGNOSIS — J3081 Allergic rhinitis due to animal (cat) (dog) hair and dander: Secondary | ICD-10-CM | POA: Diagnosis not present

## 2022-03-09 DIAGNOSIS — J3089 Other allergic rhinitis: Secondary | ICD-10-CM | POA: Diagnosis not present

## 2022-03-09 DIAGNOSIS — J301 Allergic rhinitis due to pollen: Secondary | ICD-10-CM | POA: Diagnosis not present

## 2022-03-15 DIAGNOSIS — J3081 Allergic rhinitis due to animal (cat) (dog) hair and dander: Secondary | ICD-10-CM | POA: Diagnosis not present

## 2022-03-15 DIAGNOSIS — J301 Allergic rhinitis due to pollen: Secondary | ICD-10-CM | POA: Diagnosis not present

## 2022-03-15 DIAGNOSIS — J3089 Other allergic rhinitis: Secondary | ICD-10-CM | POA: Diagnosis not present

## 2022-03-21 ENCOUNTER — Other Ambulatory Visit: Payer: Self-pay | Admitting: Family Medicine

## 2022-03-21 DIAGNOSIS — F419 Anxiety disorder, unspecified: Secondary | ICD-10-CM

## 2022-03-21 DIAGNOSIS — G4700 Insomnia, unspecified: Secondary | ICD-10-CM

## 2022-03-21 NOTE — Telephone Encounter (Signed)
Patient is requesting a refill of the following medications: Requested Prescriptions   Pending Prescriptions Disp Refills   clonazePAM (KLONOPIN) 0.5 MG tablet [Pharmacy Med Name: CLONAZEPAM 0.5 MG TABLET] 30 tablet 1    Sig: TAKE 1 TABLET BY MOUTH EVERY DAY    Date of patient request: 03/21/22 Last office visit: 02/03/22 Date of last refill: 12/30/21 Last refill amount: 30

## 2022-03-21 NOTE — Telephone Encounter (Signed)
Insomnia discussed at June 22 visit.  Belsomra given as an option at that time.  Controlled substance database reviewed.  Clonazepam last filled 01/31/2022, previously 12/30/2021.  Refill ordered of clonazepam, but please check to see status of previous Belsomra prescription.  Was that covered?

## 2022-03-22 DIAGNOSIS — J3089 Other allergic rhinitis: Secondary | ICD-10-CM | POA: Diagnosis not present

## 2022-03-22 DIAGNOSIS — J301 Allergic rhinitis due to pollen: Secondary | ICD-10-CM | POA: Diagnosis not present

## 2022-03-22 DIAGNOSIS — J3081 Allergic rhinitis due to animal (cat) (dog) hair and dander: Secondary | ICD-10-CM | POA: Diagnosis not present

## 2022-03-25 ENCOUNTER — Encounter: Payer: Self-pay | Admitting: Dermatology

## 2022-03-27 DIAGNOSIS — R5383 Other fatigue: Secondary | ICD-10-CM | POA: Diagnosis not present

## 2022-03-27 DIAGNOSIS — Z20822 Contact with and (suspected) exposure to covid-19: Secondary | ICD-10-CM | POA: Diagnosis not present

## 2022-03-27 DIAGNOSIS — J029 Acute pharyngitis, unspecified: Secondary | ICD-10-CM | POA: Diagnosis not present

## 2022-03-27 DIAGNOSIS — B349 Viral infection, unspecified: Secondary | ICD-10-CM | POA: Diagnosis not present

## 2022-04-04 DIAGNOSIS — J3081 Allergic rhinitis due to animal (cat) (dog) hair and dander: Secondary | ICD-10-CM | POA: Diagnosis not present

## 2022-04-04 DIAGNOSIS — J3089 Other allergic rhinitis: Secondary | ICD-10-CM | POA: Diagnosis not present

## 2022-04-04 DIAGNOSIS — J301 Allergic rhinitis due to pollen: Secondary | ICD-10-CM | POA: Diagnosis not present

## 2022-04-11 DIAGNOSIS — J3081 Allergic rhinitis due to animal (cat) (dog) hair and dander: Secondary | ICD-10-CM | POA: Diagnosis not present

## 2022-04-11 DIAGNOSIS — J3089 Other allergic rhinitis: Secondary | ICD-10-CM | POA: Diagnosis not present

## 2022-04-11 DIAGNOSIS — J301 Allergic rhinitis due to pollen: Secondary | ICD-10-CM | POA: Diagnosis not present

## 2022-04-25 DIAGNOSIS — J3081 Allergic rhinitis due to animal (cat) (dog) hair and dander: Secondary | ICD-10-CM | POA: Diagnosis not present

## 2022-04-25 DIAGNOSIS — J3089 Other allergic rhinitis: Secondary | ICD-10-CM | POA: Diagnosis not present

## 2022-04-25 DIAGNOSIS — J301 Allergic rhinitis due to pollen: Secondary | ICD-10-CM | POA: Diagnosis not present

## 2022-05-02 DIAGNOSIS — H43812 Vitreous degeneration, left eye: Secondary | ICD-10-CM | POA: Diagnosis not present

## 2022-05-09 DIAGNOSIS — J301 Allergic rhinitis due to pollen: Secondary | ICD-10-CM | POA: Diagnosis not present

## 2022-05-09 DIAGNOSIS — J3089 Other allergic rhinitis: Secondary | ICD-10-CM | POA: Diagnosis not present

## 2022-05-13 DIAGNOSIS — H43811 Vitreous degeneration, right eye: Secondary | ICD-10-CM | POA: Diagnosis not present

## 2022-05-23 DIAGNOSIS — J3081 Allergic rhinitis due to animal (cat) (dog) hair and dander: Secondary | ICD-10-CM | POA: Diagnosis not present

## 2022-05-23 DIAGNOSIS — J301 Allergic rhinitis due to pollen: Secondary | ICD-10-CM | POA: Diagnosis not present

## 2022-05-23 DIAGNOSIS — J3089 Other allergic rhinitis: Secondary | ICD-10-CM | POA: Diagnosis not present

## 2022-05-26 DIAGNOSIS — Z681 Body mass index (BMI) 19 or less, adult: Secondary | ICD-10-CM | POA: Diagnosis not present

## 2022-05-26 DIAGNOSIS — Z01419 Encounter for gynecological examination (general) (routine) without abnormal findings: Secondary | ICD-10-CM | POA: Diagnosis not present

## 2022-05-29 ENCOUNTER — Other Ambulatory Visit: Payer: Self-pay | Admitting: Family Medicine

## 2022-05-29 DIAGNOSIS — F419 Anxiety disorder, unspecified: Secondary | ICD-10-CM

## 2022-05-29 DIAGNOSIS — G4700 Insomnia, unspecified: Secondary | ICD-10-CM

## 2022-05-30 NOTE — Telephone Encounter (Signed)
Patient is requesting a refill of the following medications: Requested Prescriptions   Pending Prescriptions Disp Refills   clonazePAM (KLONOPIN) 0.5 MG tablet [Pharmacy Med Name: CLONAZEPAM 0.5 MG TABLET] 30 tablet 1    Sig: TAKE 1 TABLET BY MOUTH EVERY DAY    Date of patient request: 05/30/22 Last office visit: 02/03/22 Date of last refill: 03/21/22 Last refill amount: 30

## 2022-05-30 NOTE — Telephone Encounter (Signed)
Insomnia discussed at June 22nd visit.  Belsomra given as option.  Controlled substance database reviewed.  Last filled clonazepam No. 30 on 04/25/2022, previously August 7.  Refill ordered, but please check into prior Belsomra prescription.  Was this not covered?

## 2022-06-06 DIAGNOSIS — J3081 Allergic rhinitis due to animal (cat) (dog) hair and dander: Secondary | ICD-10-CM | POA: Diagnosis not present

## 2022-06-06 DIAGNOSIS — J301 Allergic rhinitis due to pollen: Secondary | ICD-10-CM | POA: Diagnosis not present

## 2022-06-06 DIAGNOSIS — J3089 Other allergic rhinitis: Secondary | ICD-10-CM | POA: Diagnosis not present

## 2022-06-21 DIAGNOSIS — J3089 Other allergic rhinitis: Secondary | ICD-10-CM | POA: Diagnosis not present

## 2022-06-21 DIAGNOSIS — J3081 Allergic rhinitis due to animal (cat) (dog) hair and dander: Secondary | ICD-10-CM | POA: Diagnosis not present

## 2022-06-21 DIAGNOSIS — J301 Allergic rhinitis due to pollen: Secondary | ICD-10-CM | POA: Diagnosis not present

## 2022-06-21 DIAGNOSIS — H1045 Other chronic allergic conjunctivitis: Secondary | ICD-10-CM | POA: Diagnosis not present

## 2022-07-04 ENCOUNTER — Other Ambulatory Visit: Payer: Self-pay | Admitting: Lab

## 2022-07-04 ENCOUNTER — Other Ambulatory Visit: Payer: Self-pay | Admitting: Family Medicine

## 2022-07-04 DIAGNOSIS — I10 Essential (primary) hypertension: Secondary | ICD-10-CM

## 2022-07-04 DIAGNOSIS — J3089 Other allergic rhinitis: Secondary | ICD-10-CM | POA: Diagnosis not present

## 2022-07-04 DIAGNOSIS — J3081 Allergic rhinitis due to animal (cat) (dog) hair and dander: Secondary | ICD-10-CM | POA: Diagnosis not present

## 2022-07-04 DIAGNOSIS — J301 Allergic rhinitis due to pollen: Secondary | ICD-10-CM | POA: Diagnosis not present

## 2022-07-04 MED ORDER — LISINOPRIL 10 MG PO TABS: 10.0000 mg | ORAL_TABLET | Freq: Every day | ORAL | 1 refills | Status: DC

## 2022-07-18 DIAGNOSIS — J3081 Allergic rhinitis due to animal (cat) (dog) hair and dander: Secondary | ICD-10-CM | POA: Diagnosis not present

## 2022-07-18 DIAGNOSIS — J301 Allergic rhinitis due to pollen: Secondary | ICD-10-CM | POA: Diagnosis not present

## 2022-07-18 DIAGNOSIS — J3089 Other allergic rhinitis: Secondary | ICD-10-CM | POA: Diagnosis not present

## 2022-07-22 DIAGNOSIS — H02831 Dermatochalasis of right upper eyelid: Secondary | ICD-10-CM | POA: Diagnosis not present

## 2022-07-22 DIAGNOSIS — Z961 Presence of intraocular lens: Secondary | ICD-10-CM | POA: Diagnosis not present

## 2022-07-22 DIAGNOSIS — H26491 Other secondary cataract, right eye: Secondary | ICD-10-CM | POA: Diagnosis not present

## 2022-07-22 DIAGNOSIS — H02834 Dermatochalasis of left upper eyelid: Secondary | ICD-10-CM | POA: Diagnosis not present

## 2022-07-22 DIAGNOSIS — H18413 Arcus senilis, bilateral: Secondary | ICD-10-CM | POA: Diagnosis not present

## 2022-07-25 ENCOUNTER — Ambulatory Visit: Payer: Medicare HMO | Admitting: Dermatology

## 2022-07-26 ENCOUNTER — Ambulatory Visit: Payer: Medicare HMO | Admitting: Dermatology

## 2022-08-01 DIAGNOSIS — J3089 Other allergic rhinitis: Secondary | ICD-10-CM | POA: Diagnosis not present

## 2022-08-01 DIAGNOSIS — J301 Allergic rhinitis due to pollen: Secondary | ICD-10-CM | POA: Diagnosis not present

## 2022-08-01 DIAGNOSIS — Z9841 Cataract extraction status, right eye: Secondary | ICD-10-CM | POA: Diagnosis not present

## 2022-08-02 ENCOUNTER — Other Ambulatory Visit: Payer: Self-pay | Admitting: Family Medicine

## 2022-08-02 DIAGNOSIS — F419 Anxiety disorder, unspecified: Secondary | ICD-10-CM

## 2022-08-02 DIAGNOSIS — G4700 Insomnia, unspecified: Secondary | ICD-10-CM

## 2022-08-02 NOTE — Telephone Encounter (Signed)
Medication discussed in June. Controlled substance database (PDMP) reviewed. No concerns appreciated. Last filled 07/04/22, #30, refill ordered.

## 2022-08-02 NOTE — Telephone Encounter (Signed)
Klonopin 0.5 mg LOV: 02/03/22 Last Refill:05/30/22 Upcoming appt: 08/11/22

## 2022-08-09 DIAGNOSIS — J3081 Allergic rhinitis due to animal (cat) (dog) hair and dander: Secondary | ICD-10-CM | POA: Diagnosis not present

## 2022-08-09 DIAGNOSIS — J3089 Other allergic rhinitis: Secondary | ICD-10-CM | POA: Diagnosis not present

## 2022-08-09 DIAGNOSIS — J301 Allergic rhinitis due to pollen: Secondary | ICD-10-CM | POA: Diagnosis not present

## 2022-08-11 ENCOUNTER — Ambulatory Visit (INDEPENDENT_AMBULATORY_CARE_PROVIDER_SITE_OTHER): Payer: Medicare HMO | Admitting: Family Medicine

## 2022-08-11 ENCOUNTER — Encounter: Payer: Self-pay | Admitting: Family Medicine

## 2022-08-11 VITALS — BP 134/72 | HR 68 | Temp 98.7°F | Ht 62.0 in | Wt 109.6 lb

## 2022-08-11 DIAGNOSIS — E785 Hyperlipidemia, unspecified: Secondary | ICD-10-CM

## 2022-08-11 DIAGNOSIS — Z0000 Encounter for general adult medical examination without abnormal findings: Secondary | ICD-10-CM

## 2022-08-11 DIAGNOSIS — I10 Essential (primary) hypertension: Secondary | ICD-10-CM

## 2022-08-11 DIAGNOSIS — G4700 Insomnia, unspecified: Secondary | ICD-10-CM | POA: Diagnosis not present

## 2022-08-11 DIAGNOSIS — Z1159 Encounter for screening for other viral diseases: Secondary | ICD-10-CM

## 2022-08-11 DIAGNOSIS — R739 Hyperglycemia, unspecified: Secondary | ICD-10-CM

## 2022-08-11 DIAGNOSIS — R69 Illness, unspecified: Secondary | ICD-10-CM | POA: Diagnosis not present

## 2022-08-11 DIAGNOSIS — F419 Anxiety disorder, unspecified: Secondary | ICD-10-CM | POA: Diagnosis not present

## 2022-08-11 LAB — LIPID PANEL
Cholesterol: 196 mg/dL (ref 0–200)
HDL: 89.5 mg/dL (ref >39.00)
LDL Cholesterol: 93 mg/dL (ref 0–99)
NonHDL: 106.42
Total CHOL/HDL Ratio: 2
Triglycerides: 69 mg/dL (ref 0.0–149.0)
VLDL: 13.8 mg/dL (ref 0.0–40.0)

## 2022-08-11 LAB — COMPREHENSIVE METABOLIC PANEL
ALT: 22 U/L (ref 0–35)
AST: 28 U/L (ref 0–37)
Albumin: 4.4 g/dL (ref 3.5–5.2)
Alkaline Phosphatase: 56 U/L (ref 39–117)
BUN: 11 mg/dL (ref 6–23)
CO2: 34 mEq/L — ABNORMAL HIGH (ref 19–32)
Calcium: 9.6 mg/dL (ref 8.4–10.5)
Chloride: 101 mEq/L (ref 96–112)
Creatinine, Ser: 0.73 mg/dL (ref 0.40–1.20)
GFR: 82.98 mL/min (ref >60.00)
Glucose, Bld: 88 mg/dL (ref 70–99)
Potassium: 3.9 mEq/L (ref 3.5–5.1)
Sodium: 141 mEq/L (ref 135–145)
Total Bilirubin: 0.6 mg/dL (ref 0.2–1.2)
Total Protein: 6.7 g/dL (ref 6.0–8.3)

## 2022-08-11 LAB — HEMOGLOBIN A1C: Hgb A1c MFr Bld: 5.8 % (ref 4.6–6.5)

## 2022-08-11 MED ORDER — CITALOPRAM HYDROBROMIDE 20 MG PO TABS: 20.0000 mg | ORAL_TABLET | Freq: Every evening | ORAL | 3 refills | Status: DC

## 2022-08-11 MED ORDER — LISINOPRIL 10 MG PO TABS: 10.0000 mg | ORAL_TABLET | Freq: Every day | ORAL | 3 refills | Status: DC

## 2022-08-11 MED ORDER — ATORVASTATIN CALCIUM 10 MG PO TABS: ORAL_TABLET | ORAL | 3 refills | Status: DC

## 2022-08-11 NOTE — Progress Notes (Signed)
Subjective:  Patient ID: Tracey Ayala, female    DOB: Apr 25, 1951  Age: 71 y.o. MRN: [id number]  CC:  Chief Complaint  Patient presents with   Annual Exam    Pt states all is well   [place]    Pt has a mole on her back she would like you to look at to see if she needs to go to the dermatologist     HPI [name] presents for Annual Exam And concern as above. Followed by allergist, Dr. Fredderick Phenix for allergic rhinitis GYN Dr. Lynnette Caffey with appointment in October. Ophthalmology Dr. Sherral Hammers, Dr. [place], vitreous degeneration of right eye Previous patient of Dr. Denna Haggard for dermatology, now established with Dr. Elvera Lennox at [facility], appt earlier this year.   Hx of skin cancer.  Concern for new mole on her back - noticed past few weeks. No itching/pain. Trouble seeing area- mole has been there - looks darker than prior.   Hyperglycemia: Glucose 103, 100 on most recent labs. Sisters with prediabetes.   Hypertension: Lisinopril 10 mg daily Home readings:  BP Readings from Last 3 Encounters:  08/11/22 134/72  02/03/22 122/80  12/23/21 118/70   Lab Results  Component Value Date   CREATININE 0.73 02/03/2022   Hyperlipidemia: Lipitor 10 mg daily no new myalgias/side effects of meds. Lab Results  Component Value Date   CHOL 206 (H) 02/03/2022   HDL 90.80 02/03/2022   LDLCALC 100 (H) 02/03/2022   TRIG 79.0 02/03/2022   CHOLHDL 2 02/03/2022   Lab Results  Component Value Date   ALT 25 02/03/2022   AST 30 02/03/2022   ALKPHOS 63 02/03/2022   BILITOT 0.9 02/03/2022   Osteopenia Plan repeat bone density in 2024.  Had been on vitamin D supplement in the past but stopped with rash under her arms.  Was taking 2000 units once per week, calcium in the diet as of her June visit.  Vitamin D was fine at that time. Vitamin D twice per week during the winter.  Last vitamin D Lab Results  Component Value Date   VD25OH 76.29 02/03/2022   Anxiety with  insomnia Treated with Celexa 20 mg daily, clonazepam if needed.  Difficulty tolerating hydroxyzine previously.  No relief with melatonin previously.  Option of Belsomra discussed at her June visit., but that was cost prohibitive. Handout given on insomnia at that time. Taking 1/2 klonopin per night. Unable to stop altogether. Sleeping ok on 1/2 dose.  Controlled substance database reviewed, last filled December 19.    08/11/2022   10:11 AM 02/03/2022   11:17 AM 01/06/2022   11:41 AM 11/04/2021   10:42 AM 07/30/2021    9:20 AM  Depression screen PHQ 2/9  Decreased Interest 0 0 0 0 0  Down, Depressed, Hopeless 0 0 0 0 0  PHQ - 2 Score 0 0 0 0 0  Altered sleeping 0    1  Tired, decreased energy 1    0  Change in appetite 0    0  Feeling bad or failure about yourself  0    0  Trouble concentrating 0    0  Moving slowly or fidgety/restless 0    0  Suicidal thoughts 0    0  PHQ-9 Score 1    1  Difficult doing work/chores     Not difficult at all    Health Maintenance  Topic Date Due   Zoster Vaccines- Shingrix (1 of 2) 08/11/1970   COVID-19  Vaccine (1) 08/27/2022 (Originally 08/11/1956)   Hepatitis C Screening  11/05/2022 (Originally 08/11/1969)   INFLUENZA VACCINE  11/13/2022 (Originally 03/15/2022)   DEXA SCAN  02/04/2023 (Originally 08/11/2016)   Medicare Annual Wellness (AWV)  01/07/2023   MAMMOGRAM  03/02/2024   DTaP/Tdap/Td (4 - Td or Tdap) 08/03/2028   COLONOSCOPY (Pts 45-18yr Insurance coverage will need to be confirmed)  01/19/2030   Pneumonia Vaccine 71 Years old  Completed   HPV VACCINES  Aged Out  Colonoscopy in 2021 - hx of polyps, repeat in 5 yrs.  Recent pap with GYN as above.  Mammogram in July. Bone density in July 2024.  Derm as above.   Immunization History  Administered Date(s) Administered   Influenza Split 06/25/2009, 05/18/2010, 05/23/2012, 06/13/2014, 05/18/2016   Influenza, High Dose Seasonal PF 06/22/2017, 06/28/2018, 06/04/2019, 06/24/2019, 06/23/2020    Influenza,inj,Quad PF,6+ Mos 05/22/2013   Influenza,inj,quad, With Preservative 06/01/2015   Pneumococcal Conjugate-13 02/01/2017   Pneumococcal Polysaccharide-23 06/22/2017, 04/05/2018, 06/28/2018, 06/23/2020   Td 04/05/2018   Tdap 11/06/2008, 08/03/2018   Zoster, Live 01/02/2012, 04/28/2018, 08/26/2018   Zoster, Unspecified 06/28/2022  Flu vaccine - declines Covid vaccine - declines Hep C screen - agrees [facility]   No results found. [facility]   Dental: Dr. SImagene Richesevery 6 months.   Alcohol: none  Tobacco: none  Exercise: walking 4-5 times per week few miles, going to gym in colder weather or during allergy season. Over 1512m per week.    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   Past Medical History:  Diagnosis Date   Adnexal mass    Allergy    Receives injections once a week   Anxiety    Basal cell carcinoma 03/04/2013   bcc right nostril =mohs   Cataract    Endometriosis    Essential hypertension 12/23/2021   Fibroma of right ovary 12/15/2020   Hypercholesterolemia    Hypertension    Mitral valve prolapse 12/23/2021   Syncope and collapse 12/23/2021   Wears glasses    Past Surgical History:  Procedure Laterality Date   basal cell carcinoma from left shoulder  2018   mohs procedure  2014   right nostril   ovarian cystectomy and appendectomy Left 1978   ROBOTIC ASSISTED SALPINGO OOPHERECTOMY Bilateral 12/07/2020   Procedure: XI ROBOTIC [place] LAPAROTOMY, PELVIC WASHINGS;  Surgeon: TuLafonda MossesMD;  [facility];  Laterality:  Bilateral;   WISDOM TOOTH EXTRACTION  1974   Allergies  Allergen Reactions   Codeine Nausea Only, Nausea And Vomiting and Other (See Comments)    Sick   Other reaction(s): GI upset nausea  Sick   Other reaction(s): GI upset nausea  Sick   Other reaction(s): GI upset nausea  Sick   Other reaction(s): GI upset nausea   Fosamax [Alendronate] Rash   Prior to Admission medications   Medication Sig Start Date End Date Taking? Authorizing Provider  atorvastatin (LIPITOR) 10 MG tablet TAKE 1 TABLET BY MOUTH EVERY DAY 02/03/22  Yes GrWendie AgresteMD  Cholecalciferol (VITAMIN D) 50 MCG (2000 UT) CAPS Take 2,000 Units by mouth daily.  Vitamin d 3   Yes [provider]  citalopram (CELEXA) 20 MG tablet Take 1 tablet (20 mg total) by mouth every evening. 02/03/22  Yes Wendie Agreste, MD  clonazePAM (KLONOPIN) 0.5 MG tablet TAKE 1 TABLET BY MOUTH EVERY DAY 08/02/22  Yes Wendie Agreste, MD  EPINEPHrine 0.3 mg/0.3 mL IJ SOAJ injection Inject 0.3 mg into the muscle as needed for anaphylaxis.   Yes [provider]  lisinopril (ZESTRIL) 10 MG tablet TAKE 1 TABLET BY MOUTH EVERY DAY 07/04/22  Yes Wendie Agreste, MD  PRESCRIPTION MEDICATION Allergy Injections once a week.   Yes [provider]  lisinopril (ZESTRIL) 10 MG tablet Take 1 tablet (10 mg total) by mouth daily. Patient not taking: Reported on 08/11/2022 07/04/22   Wendie Agreste, MD  Menaquinone-7 (VITAMIN K2 PO) Take 1 tablet by mouth daily. Patient not taking: Reported on 08/11/2022    [provider]  Omega-3 Fatty Acids (FISH OIL) 1000 MG CAPS Take 1 capsule by mouth daily. Patient not taking: Reported on 08/11/2022    [provider]   Social History   Socioeconomic History   Marital status: Widowed    Spouse name: Not on file   Number of children: Not on file   Years of education: Not on file   Highest education level: Not on file  Occupational History   Occupation:  retired  Tobacco Use   Smoking status: Never   Smokeless tobacco: Never  Vaping Use   Vaping Use: Never used  Substance and Sexual Activity   Alcohol use: Never   Drug use: Never   Sexual activity: Not Currently  Other Topics Concern   Not on file  Social History Narrative   Not on file   Social Determinants of Health   Financial Resource Strain: Low Risk  (01/06/2022)   Overall Financial Resource Strain (CARDIA)    Difficulty of Paying Living Expenses: Not hard at all  Food Insecurity: No Food Insecurity (01/06/2022)   Hunger Vital Sign    Worried About Running Out of Food in the Last Year: Never true    [place] in the Last Year: Never true  Transportation Needs: No Transportation Needs (01/06/2022)   PRAPARE - Hydrologist (Medical): No    Lack of Transportation (Non-Medical): No  Physical Activity: Sufficiently Active (01/06/2022)   Exercise Vital Sign    Days of Exercise per Week: 5 days    Minutes of Exercise per Session: 60 min  Stress: No Stress Concern Present (01/06/2022)   [place]    Feeling of Stress : Not at all  Social Connections: Moderately Isolated (01/06/2022)   Social Connection and Isolation Panel [NHANES]    Frequency of Communication with Friends and Family: More than three times a week    Frequency of Social Gatherings with Friends and Family: More than three times a week    Attends Religious Services: Never    Marine scientist or Organizations: Yes    Attends Music therapist: More than 4 times per year    Marital Status: Widowed  Intimate Partner Violence: Not At Risk (01/06/2022)   Humiliation, Afraid, Rape, and Kick questionnaire    Fear of Current or Ex-Partner: No    Emotionally Abused: No    Physically Abused: No    Sexually Abused: No    Review of Systems  13 point review  of systems per patient health survey noted.   Negative other than as indicated above or in HPI.   Objective:   Vitals:   08/11/22 1013  BP: 134/72  Pulse: 68  Temp: 98.7 F (37.1 C)  SpO2: 98%  Weight: 109 lb 9.6 oz (49.7 kg)  Height: '5\' 2"'$  (1.575 m)     Physical Exam Constitutional:      Appearance: She is well-developed.  HENT:     Head: Normocephalic and atraumatic.     Right Ear: External ear normal.     Left Ear: External ear normal.  Eyes:     Conjunctiva/sclera: Conjunctivae normal.     Pupils: Pupils are equal, round, and reactive to light.  Neck:     Thyroid: No thyromegaly.  Cardiovascular:     Rate and Rhythm: Normal rate and regular rhythm.     Heart sounds: Normal heart sounds. No murmur heard. Pulmonary:     Effort: Pulmonary effort is normal. No respiratory distress.     Breath sounds: Normal breath sounds. No wheezing.  Abdominal:     General: Bowel sounds are normal.     Palpations: Abdomen is soft.     Tenderness: There is no abdominal tenderness.  Musculoskeletal:        General: No tenderness. Normal range of motion.     Cervical back: Normal range of motion and neck supple.  Lymphadenopathy:     Cervical: No cervical adenopathy.  Skin:    General: Skin is warm and dry.     Findings: No rash.     Comments: Multiple flesh-colored nevi on back, few scattered elevated lesions, appear to be seborrheic keratoses.  I did not see 1 specific lesion with significant darkened area or concerning changes  Neurological:     Mental Status: She is alert and oriented to person, place, and time.  Psychiatric:        Behavior: Behavior normal.        Thought Content: Thought content normal.        Assessment & Plan:  LOA IDLER is a 71 y.o. female . Annual physical exam  - -anticipatory guidance as below in AVS, screening labs above. Health maintenance items as above in HPI discussed/recommended as applicable.   -Areas of back appear to be seborrheic keratoses but recommended she follow-up  with her dermatologist for secondary exam/eval  Hyperlipidemia, unspecified hyperlipidemia type - Plan: citalopram , Lipid panel, atorvastatin (LIPITOR) 10 MG tablet  Anxiety Insomnia, unspecified type (CELEXA) 20 MG tablet  -Overall stable with no new side effects on low-dose clonazepam.  Risks and side effects of that class of medication have been discussed.  Understanding expressed.  Continue same regimen  Essential hypertension - Plan: lisinopril (ZESTRIL) 10 MG tablet  -Tolerating current med regimen, stable, no changes.  Check labs with medication adjustment accordingly  Hyperglycemia - Plan: Hemoglobin A1c, Comprehensive metabolic panel  Need for hepatitis C screening test - Plan: Hepatitis C antibody   Meds ordered this encounter  Medications   citalopram (CELEXA) 20 MG tablet    Sig: Take 1 tablet (20 mg total) by mouth every evening.    Dispense:  90 tablet    Refill:  3   lisinopril (ZESTRIL) 10 MG tablet    Sig: Take 1 tablet (10 mg total) by mouth daily.    Dispense:  90 tablet    Refill:  3   atorvastatin (LIPITOR) 10 MG tablet    Sig: TAKE 1 TABLET  BY MOUTH EVERY DAY    Dispense:  90 tablet    Refill:  3   Patient Instructions  On my exam I do not see a specific concern on the lesions on the back but I would recommend you discuss with your dermatologist and have them do an exam as well.  I will check some labs today and let you know if there are concerns.  No medication changes for now.  Take care!  Health Maintenance After Age 34 After age 7, you are at a higher risk for certain long-term diseases and infections as well as injuries from falls. Falls are a major cause of broken bones and head injuries in people who are older than age 23. Getting regular preventive care can help to keep you healthy and well. Preventive care includes getting regular testing and making lifestyle changes as recommended by your health care provider. Talk with your health care provider  about: Which screenings and tests you should have. A screening is a test that checks for a disease when you have no symptoms. A diet and exercise plan that is right for you. What should I know about screenings and tests to prevent falls? Screening and testing are the best ways to find a health problem early. Early diagnosis and treatment give you the best chance of managing medical conditions that are common after age 67. Certain conditions and lifestyle choices may make you more likely to have a fall. Your health care provider may recommend: Regular vision checks. Poor vision and conditions such as cataracts can make you more likely to have a fall. If you wear glasses, make sure to get your prescription updated if your vision changes. Medicine review. Work with your health care provider to regularly review all of the medicines you are taking, including over-the-counter medicines. Ask your health care provider about any side effects that may make you more likely to have a fall. Tell your health care provider if any medicines that you take make you feel dizzy or sleepy. Strength and balance checks. Your health care provider may recommend certain tests to check your strength and balance while standing, walking, or changing positions. Foot health exam. Foot pain and numbness, as well as not wearing proper footwear, can make you more likely to have a fall. Screenings, including: Osteoporosis screening. Osteoporosis is a condition that causes the bones to get weaker and break more easily. Blood pressure screening. Blood pressure changes and medicines to control blood pressure can make you feel dizzy. Depression screening. You may be more likely to have a fall if you have a fear of falling, feel depressed, or feel unable to do activities that you used to do. Alcohol use screening. Using too much alcohol can affect your balance and may make you more likely to have a fall. Follow these instructions at  home: Lifestyle Do not drink alcohol if: Your health care provider tells you not to drink. If you drink alcohol: Limit how much you have to: 0-1 drink a day for women. 0-2 drinks a day for men. Know how much alcohol is in your drink. In the [place] one drink equals one 12 oz bottle of beer (355 mL), one 5 oz glass of wine (148 mL), or one 1 oz glass of hard liquor (44 mL). Do not use any products that contain nicotine or tobacco. These products include cigarettes, chewing tobacco, and vaping devices, such as e-cigarettes. If you need help quitting, ask your health care provider. Activity  Follow a regular exercise program to stay fit. This will help you maintain your balance. Ask your health care provider what types of exercise are appropriate for you. If you need a cane or walker, use it as recommended by your health care provider. Wear supportive shoes that have nonskid soles. Safety  Remove any tripping hazards, such as rugs, cords, and clutter. Install safety equipment such as grab bars in bathrooms and safety rails on stairs. Keep rooms and walkways well-lit. General instructions Talk with your health care provider about your risks for falling. Tell your health care provider if: You fall. Be sure to tell your health care provider about all falls, even ones that seem minor. You feel dizzy, tiredness (fatigue), or off-balance. Take over-the-counter and prescription medicines only as told by your health care provider. These include supplements. Eat a healthy diet and maintain a healthy weight. A healthy diet includes low-fat dairy products, low-fat (lean) meats, and fiber from whole grains, beans, and lots of fruits and vegetables. Stay current with your vaccines. Schedule regular health, dental, and eye exams. Summary Having a healthy lifestyle and getting preventive care can help to protect your health and wellness after age 45. Screening and testing are the best way to find a health  problem early and help you avoid having a fall. Early diagnosis and treatment give you the best chance for managing medical conditions that are more common for people who are older than age 63. Falls are a major cause of broken bones and head injuries in people who are older than age 93. Take precautions to prevent a fall at home. Work with your health care provider to learn what changes you can make to improve your health and wellness and to prevent falls. This information is not intended to replace advice given to you by your health care provider. Make sure you discuss any questions you have with your health care provider. Document Revised: 12/21/2020 Document Reviewed: 12/21/2020 Elsevier Patient Education  [place],   Merri Ray, MD [place], [facility] 08/11/22 11:13 AM

## 2022-08-11 NOTE — Patient Instructions (Addendum)
On my exam I do not see a specific concern on the lesions on the back but I would recommend you discuss with your dermatologist and have them do an exam as well.  I will check some labs today and let you know if there are concerns.  No medication changes for now.  Take care!  Health Maintenance After Age 71 After age 67, you are at a higher risk for certain long-term diseases and infections as well as injuries from falls. Falls are a major cause of broken bones and head injuries in people who are older than age 39. Getting regular preventive care can help to keep you healthy and well. Preventive care includes getting regular testing and making lifestyle changes as recommended by your health care provider. Talk with your health care provider about: Which screenings and tests you should have. A screening is a test that checks for a disease when you have no symptoms. A diet and exercise plan that is right for you. What should I know about screenings and tests to prevent falls? Screening and testing are the best ways to find a health problem early. Early diagnosis and treatment give you the best chance of managing medical conditions that are common after age 92. Certain conditions and lifestyle choices may make you more likely to have a fall. Your health care provider may recommend: Regular vision checks. Poor vision and conditions such as cataracts can make you more likely to have a fall. If you wear glasses, make sure to get your prescription updated if your vision changes. Medicine review. Work with your health care provider to regularly review all of the medicines you are taking, including over-the-counter medicines. Ask your health care provider about any side effects that may make you more likely to have a fall. Tell your health care provider if any medicines that you take make you feel dizzy or sleepy. Strength and balance checks. Your health care provider may recommend certain tests to check your  strength and balance while standing, walking, or changing positions. Foot health exam. Foot pain and numbness, as well as not wearing proper footwear, can make you more likely to have a fall. Screenings, including: Osteoporosis screening. Osteoporosis is a condition that causes the bones to get weaker and break more easily. Blood pressure screening. Blood pressure changes and medicines to control blood pressure can make you feel dizzy. Depression screening. You may be more likely to have a fall if you have a fear of falling, feel depressed, or feel unable to do activities that you used to do. Alcohol use screening. Using too much alcohol can affect your balance and may make you more likely to have a fall. Follow these instructions at home: Lifestyle Do not drink alcohol if: Your health care provider tells you not to drink. If you drink alcohol: Limit how much you have to: 0-1 drink a day for women. 0-2 drinks a day for men. Know how much alcohol is in your drink. In the [place] one drink equals one 12 oz bottle of beer (355 mL), one 5 oz glass of wine (148 mL), or one 1 oz glass of hard liquor (44 mL). Do not use any products that contain nicotine or tobacco. These products include cigarettes, chewing tobacco, and vaping devices, such as e-cigarettes. If you need help quitting, ask your health care provider. Activity  Follow a regular exercise program to stay fit. This will help you maintain your balance. Ask your health care provider what types of  exercise are appropriate for you. If you need a cane or walker, use it as recommended by your health care provider. Wear supportive shoes that have nonskid soles. Safety  Remove any tripping hazards, such as rugs, cords, and clutter. Install safety equipment such as grab bars in bathrooms and safety rails on stairs. Keep rooms and walkways well-lit. General instructions Talk with your health care provider about your risks for falling. Tell your  health care provider if: You fall. Be sure to tell your health care provider about all falls, even ones that seem minor. You feel dizzy, tiredness (fatigue), or off-balance. Take over-the-counter and prescription medicines only as told by your health care provider. These include supplements. Eat a healthy diet and maintain a healthy weight. A healthy diet includes low-fat dairy products, low-fat (lean) meats, and fiber from whole grains, beans, and lots of fruits and vegetables. Stay current with your vaccines. Schedule regular health, dental, and eye exams. Summary Having a healthy lifestyle and getting preventive care can help to protect your health and wellness after age 37. Screening and testing are the best way to find a health problem early and help you avoid having a fall. Early diagnosis and treatment give you the best chance for managing medical conditions that are more common for people who are older than age 57. Falls are a major cause of broken bones and head injuries in people who are older than age 18. Take precautions to prevent a fall at home. Work with your health care provider to learn what changes you can make to improve your health and wellness and to prevent falls. This information is not intended to replace advice given to you by your health care provider. Make sure you discuss any questions you have with your health care provider. Document Revised: 12/21/2020 Document Reviewed: 12/21/2020 Elsevier Patient Education  [place].

## 2022-08-12 DIAGNOSIS — J301 Allergic rhinitis due to pollen: Secondary | ICD-10-CM | POA: Diagnosis not present

## 2022-08-12 DIAGNOSIS — J3089 Other allergic rhinitis: Secondary | ICD-10-CM | POA: Diagnosis not present

## 2022-08-16 DIAGNOSIS — J3089 Other allergic rhinitis: Secondary | ICD-10-CM | POA: Diagnosis not present

## 2022-08-16 DIAGNOSIS — J3081 Allergic rhinitis due to animal (cat) (dog) hair and dander: Secondary | ICD-10-CM | POA: Diagnosis not present

## 2022-08-16 DIAGNOSIS — J301 Allergic rhinitis due to pollen: Secondary | ICD-10-CM | POA: Diagnosis not present

## 2022-08-18 DIAGNOSIS — R300 Dysuria: Secondary | ICD-10-CM | POA: Diagnosis not present

## 2022-08-23 DIAGNOSIS — J301 Allergic rhinitis due to pollen: Secondary | ICD-10-CM | POA: Diagnosis not present

## 2022-08-23 DIAGNOSIS — J3081 Allergic rhinitis due to animal (cat) (dog) hair and dander: Secondary | ICD-10-CM | POA: Diagnosis not present

## 2022-08-23 DIAGNOSIS — J3089 Other allergic rhinitis: Secondary | ICD-10-CM | POA: Diagnosis not present

## 2022-08-25 DIAGNOSIS — R319 Hematuria, unspecified: Secondary | ICD-10-CM | POA: Diagnosis not present

## 2022-08-30 DIAGNOSIS — J3081 Allergic rhinitis due to animal (cat) (dog) hair and dander: Secondary | ICD-10-CM | POA: Diagnosis not present

## 2022-08-30 DIAGNOSIS — Z85828 Personal history of other malignant neoplasm of skin: Secondary | ICD-10-CM | POA: Diagnosis not present

## 2022-08-30 DIAGNOSIS — L570 Actinic keratosis: Secondary | ICD-10-CM | POA: Diagnosis not present

## 2022-08-30 DIAGNOSIS — L821 Other seborrheic keratosis: Secondary | ICD-10-CM | POA: Diagnosis not present

## 2022-08-30 DIAGNOSIS — J301 Allergic rhinitis due to pollen: Secondary | ICD-10-CM | POA: Diagnosis not present

## 2022-08-30 DIAGNOSIS — D1801 Hemangioma of skin and subcutaneous tissue: Secondary | ICD-10-CM | POA: Diagnosis not present

## 2022-08-30 DIAGNOSIS — J3089 Other allergic rhinitis: Secondary | ICD-10-CM | POA: Diagnosis not present

## 2022-09-06 DIAGNOSIS — J3081 Allergic rhinitis due to animal (cat) (dog) hair and dander: Secondary | ICD-10-CM | POA: Diagnosis not present

## 2022-09-06 DIAGNOSIS — J301 Allergic rhinitis due to pollen: Secondary | ICD-10-CM | POA: Diagnosis not present

## 2022-09-06 DIAGNOSIS — J3089 Other allergic rhinitis: Secondary | ICD-10-CM | POA: Diagnosis not present

## 2022-09-08 DIAGNOSIS — R69 Illness, unspecified: Secondary | ICD-10-CM | POA: Diagnosis not present

## 2022-09-13 DIAGNOSIS — J3081 Allergic rhinitis due to animal (cat) (dog) hair and dander: Secondary | ICD-10-CM | POA: Diagnosis not present

## 2022-09-13 DIAGNOSIS — J301 Allergic rhinitis due to pollen: Secondary | ICD-10-CM | POA: Diagnosis not present

## 2022-09-13 DIAGNOSIS — J3089 Other allergic rhinitis: Secondary | ICD-10-CM | POA: Diagnosis not present

## 2022-09-20 DIAGNOSIS — J3089 Other allergic rhinitis: Secondary | ICD-10-CM | POA: Diagnosis not present

## 2022-09-20 DIAGNOSIS — J301 Allergic rhinitis due to pollen: Secondary | ICD-10-CM | POA: Diagnosis not present

## 2022-09-27 DIAGNOSIS — J301 Allergic rhinitis due to pollen: Secondary | ICD-10-CM | POA: Diagnosis not present

## 2022-09-27 DIAGNOSIS — J3089 Other allergic rhinitis: Secondary | ICD-10-CM | POA: Diagnosis not present

## 2022-09-27 DIAGNOSIS — J3081 Allergic rhinitis due to animal (cat) (dog) hair and dander: Secondary | ICD-10-CM | POA: Diagnosis not present

## 2022-09-29 DIAGNOSIS — R3121 Asymptomatic microscopic hematuria: Secondary | ICD-10-CM | POA: Diagnosis not present

## 2022-09-29 DIAGNOSIS — R350 Frequency of micturition: Secondary | ICD-10-CM | POA: Diagnosis not present

## 2022-10-03 DIAGNOSIS — J301 Allergic rhinitis due to pollen: Secondary | ICD-10-CM | POA: Diagnosis not present

## 2022-10-03 DIAGNOSIS — J3089 Other allergic rhinitis: Secondary | ICD-10-CM | POA: Diagnosis not present

## 2022-10-03 DIAGNOSIS — J3081 Allergic rhinitis due to animal (cat) (dog) hair and dander: Secondary | ICD-10-CM | POA: Diagnosis not present

## 2022-10-10 DIAGNOSIS — J301 Allergic rhinitis due to pollen: Secondary | ICD-10-CM | POA: Diagnosis not present

## 2022-10-10 DIAGNOSIS — J3081 Allergic rhinitis due to animal (cat) (dog) hair and dander: Secondary | ICD-10-CM | POA: Diagnosis not present

## 2022-10-10 DIAGNOSIS — J3089 Other allergic rhinitis: Secondary | ICD-10-CM | POA: Diagnosis not present

## 2022-10-17 DIAGNOSIS — J3089 Other allergic rhinitis: Secondary | ICD-10-CM | POA: Diagnosis not present

## 2022-10-17 DIAGNOSIS — J301 Allergic rhinitis due to pollen: Secondary | ICD-10-CM | POA: Diagnosis not present

## 2022-10-17 DIAGNOSIS — J3081 Allergic rhinitis due to animal (cat) (dog) hair and dander: Secondary | ICD-10-CM | POA: Diagnosis not present

## 2022-10-21 DIAGNOSIS — H40013 Open angle with borderline findings, low risk, bilateral: Secondary | ICD-10-CM | POA: Diagnosis not present

## 2022-10-21 DIAGNOSIS — H43813 Vitreous degeneration, bilateral: Secondary | ICD-10-CM | POA: Diagnosis not present

## 2022-10-22 DIAGNOSIS — R300 Dysuria: Secondary | ICD-10-CM | POA: Diagnosis not present

## 2022-10-22 DIAGNOSIS — N390 Urinary tract infection, site not specified: Secondary | ICD-10-CM | POA: Diagnosis not present

## 2022-10-24 DIAGNOSIS — J3081 Allergic rhinitis due to animal (cat) (dog) hair and dander: Secondary | ICD-10-CM | POA: Diagnosis not present

## 2022-10-24 DIAGNOSIS — J301 Allergic rhinitis due to pollen: Secondary | ICD-10-CM | POA: Diagnosis not present

## 2022-10-24 DIAGNOSIS — J3089 Other allergic rhinitis: Secondary | ICD-10-CM | POA: Diagnosis not present

## 2022-10-31 DIAGNOSIS — J3089 Other allergic rhinitis: Secondary | ICD-10-CM | POA: Diagnosis not present

## 2022-10-31 DIAGNOSIS — J3081 Allergic rhinitis due to animal (cat) (dog) hair and dander: Secondary | ICD-10-CM | POA: Diagnosis not present

## 2022-10-31 DIAGNOSIS — J301 Allergic rhinitis due to pollen: Secondary | ICD-10-CM | POA: Diagnosis not present

## 2022-11-04 ENCOUNTER — Other Ambulatory Visit: Payer: Self-pay | Admitting: Family Medicine

## 2022-11-04 DIAGNOSIS — G4700 Insomnia, unspecified: Secondary | ICD-10-CM

## 2022-11-04 DIAGNOSIS — F419 Anxiety disorder, unspecified: Secondary | ICD-10-CM

## 2022-11-04 NOTE — Telephone Encounter (Signed)
Clonazepam 0.5 mg LOV: 08/11/22 Last Refill:08/11/22 Upcoming appt: 02/13/23

## 2022-11-05 NOTE — Telephone Encounter (Signed)
Annual physical exam in December.  Klonopin discussed at that time.  Controlled substance database reviewed.  Last prescription filled 09/24/2022.  Refill ordered

## 2022-11-08 DIAGNOSIS — J301 Allergic rhinitis due to pollen: Secondary | ICD-10-CM | POA: Diagnosis not present

## 2022-11-08 DIAGNOSIS — J3089 Other allergic rhinitis: Secondary | ICD-10-CM | POA: Diagnosis not present

## 2022-11-09 DIAGNOSIS — H6121 Impacted cerumen, right ear: Secondary | ICD-10-CM | POA: Diagnosis not present

## 2022-11-15 DIAGNOSIS — J301 Allergic rhinitis due to pollen: Secondary | ICD-10-CM | POA: Diagnosis not present

## 2022-11-15 DIAGNOSIS — J3089 Other allergic rhinitis: Secondary | ICD-10-CM | POA: Diagnosis not present

## 2022-11-15 DIAGNOSIS — J3081 Allergic rhinitis due to animal (cat) (dog) hair and dander: Secondary | ICD-10-CM | POA: Diagnosis not present

## 2022-11-16 DIAGNOSIS — R350 Frequency of micturition: Secondary | ICD-10-CM | POA: Diagnosis not present

## 2022-11-16 DIAGNOSIS — R3121 Asymptomatic microscopic hematuria: Secondary | ICD-10-CM | POA: Diagnosis not present

## 2022-11-18 DIAGNOSIS — R69 Illness, unspecified: Secondary | ICD-10-CM | POA: Diagnosis not present

## 2022-11-20 IMAGING — CT CT ABD-PELV W/ CM
2 of 5 series · 16 of 46 positions shown, 18 images · IV contrast (APPLIED)
Comparison: None.

CLINICAL DATA: Abdominal pain, lower abdominal mass

EXAM:
CT ABDOMEN AND PELVIS WITH CONTRAST
TECHNIQUE: Multidetector CT imaging of the abdomen and pelvis was performed
using the standard protocol following bolus administration of
intravenous contrast.
CONTRAST:  100mL OMNIPAQUE IOHEXOL 300 MG/ML  SOLN

[Series 3: abdomen 5.0 · axial · 0.69mm/px · z∈[-592,-232]mm · 13 of 86 slices shown, 15 images]
[im 7/86  soft-tissue]
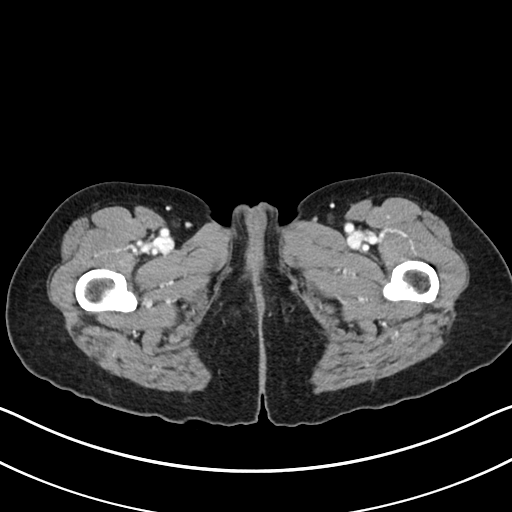
[im 7/86  bone]
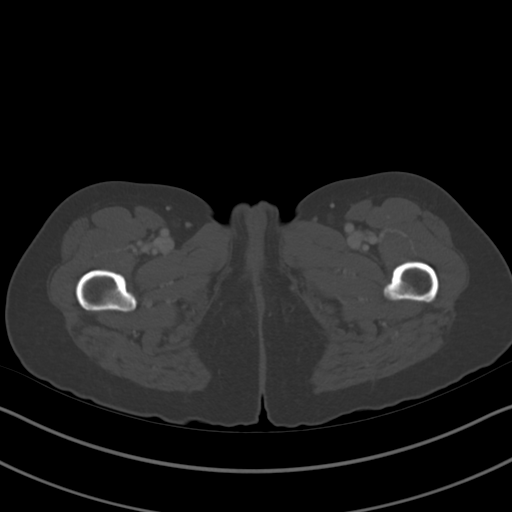
[im 13/86  soft-tissue]
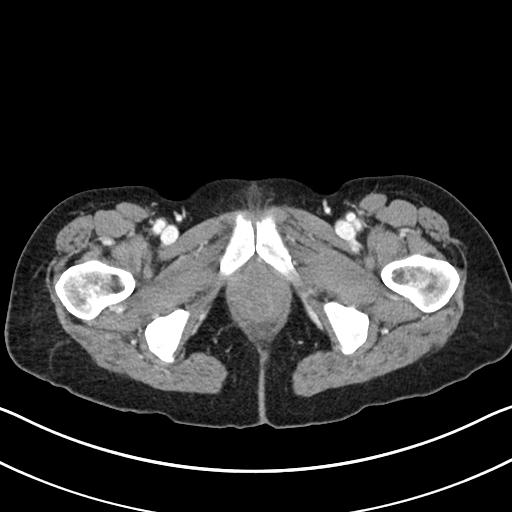
[im 19/86  soft-tissue]
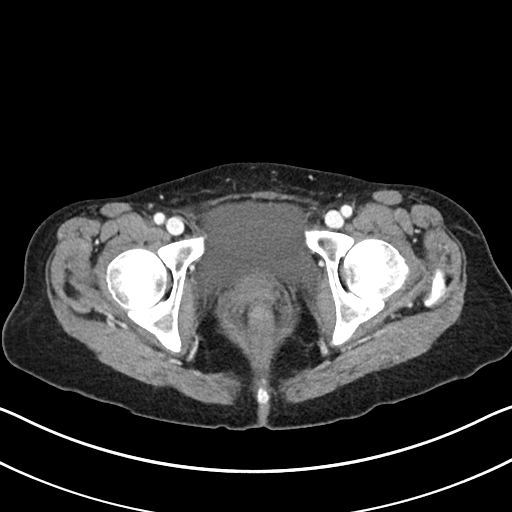
[im 25/86  soft-tissue]
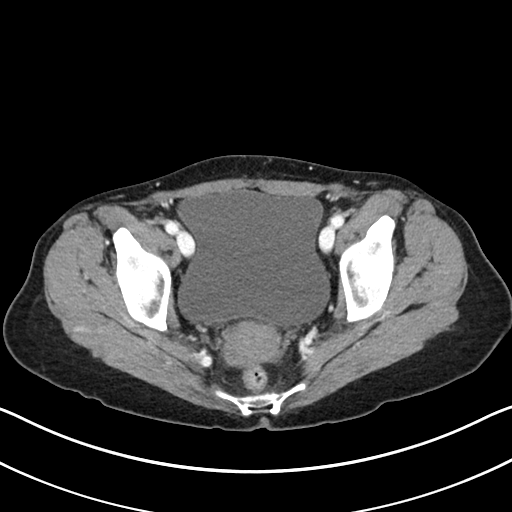
[im 31/86  soft-tissue]
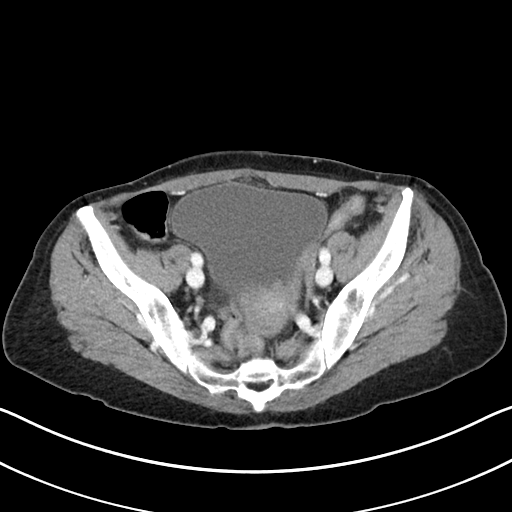
[im 37/86  soft-tissue]
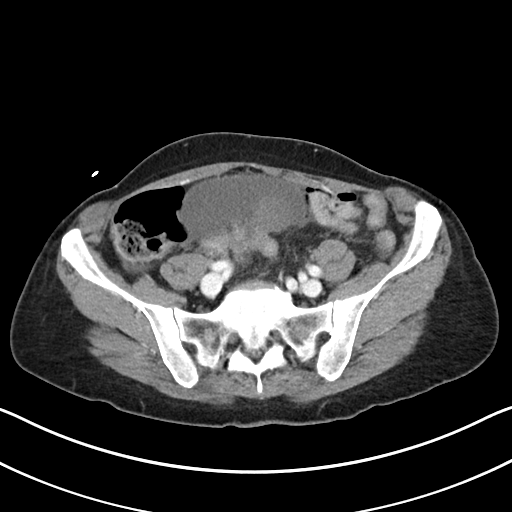
[im 43/86  soft-tissue]
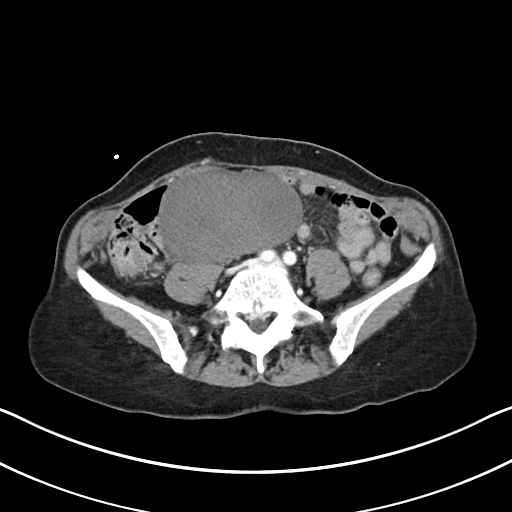
[im 49/86  soft-tissue]
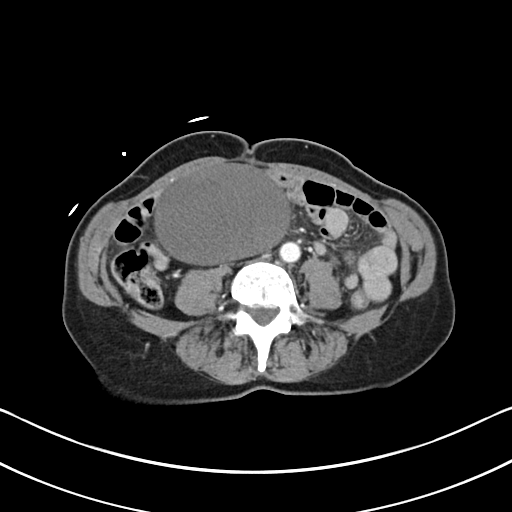
[im 55/86  soft-tissue]
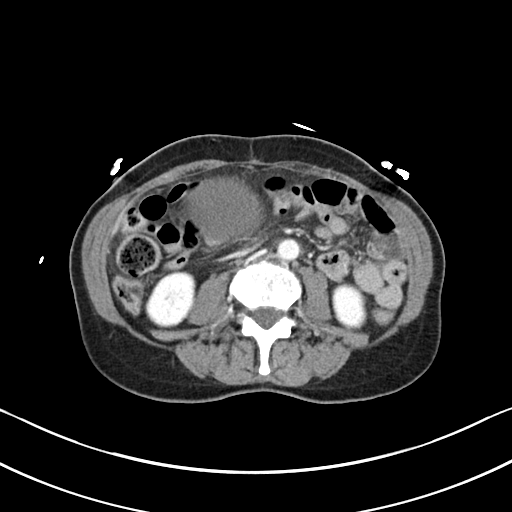
[im 55/86  bone]
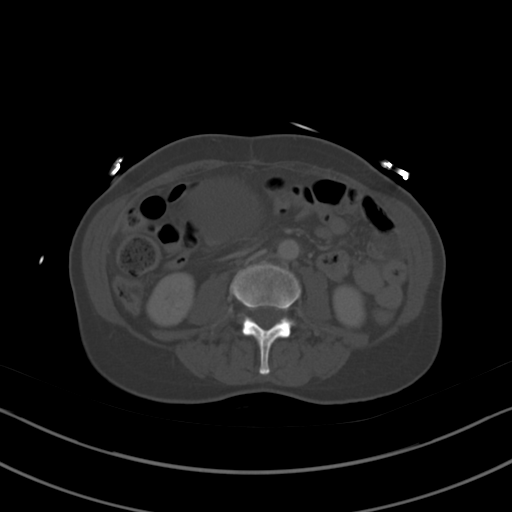
[im 61/86  soft-tissue]
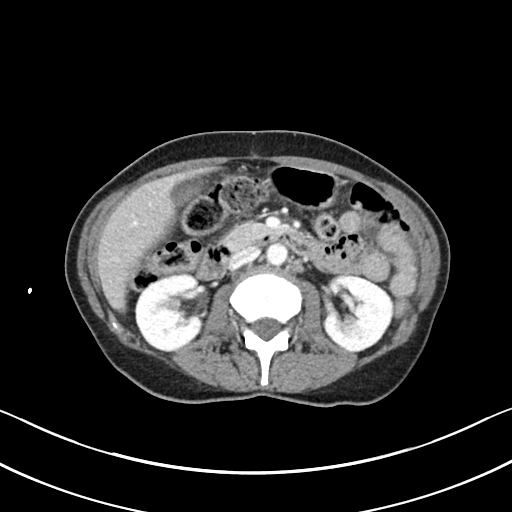
[im 67/86  soft-tissue]
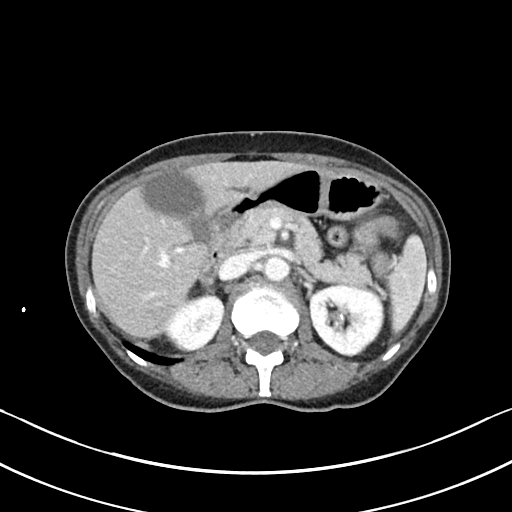
[im 73/86  soft-tissue]
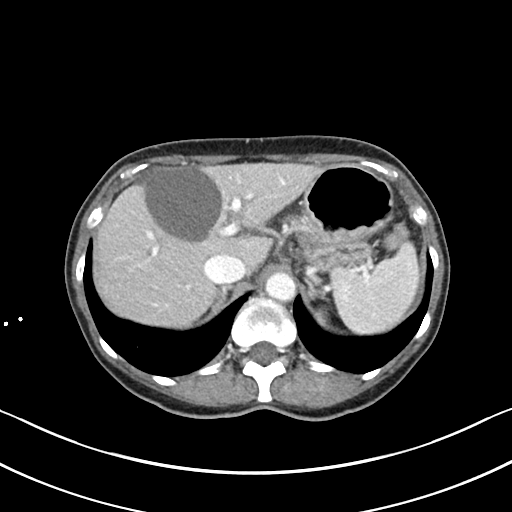
[im 79/86  soft-tissue]
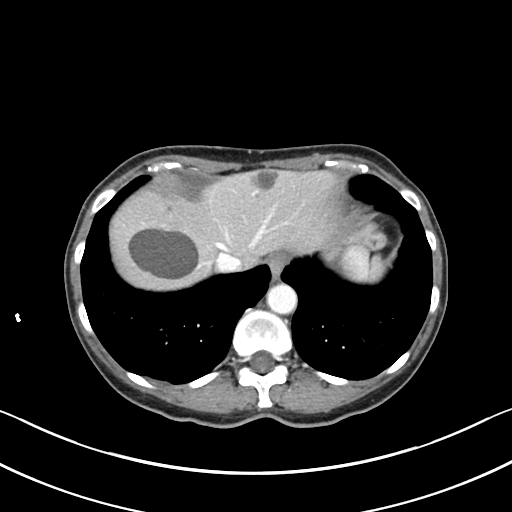

[Series 6: abdomen 3.0 mpr cor · coronal · 0.71mm/px · 3 of 71 slices shown]
[im 24/71  soft-tissue]
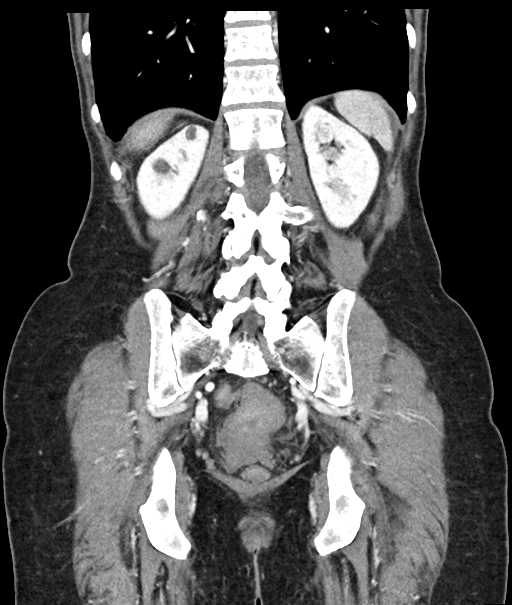
[im 32/71  soft-tissue]
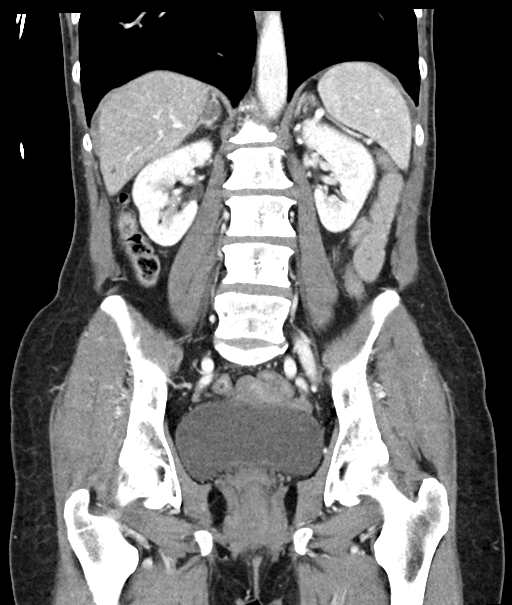
[im 39/71  soft-tissue]
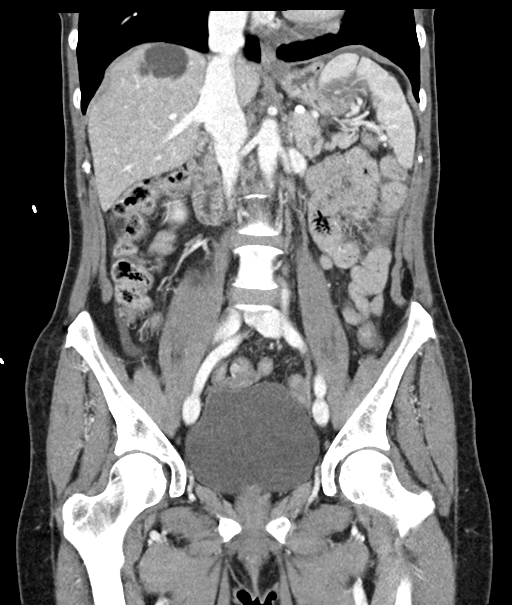

[16 of 46 positions shown; findings below may reference images not displayed]

FINDINGS: Lower chest: Mild pleural thickening right posterior costophrenic
angle measuring up to 4 mm. No pleural effusion. Bibasilar
emphysema. No acute airspace disease.

Hepatobiliary: Multiple hepatic cysts are identified. Otherwise the
liver is unremarkable. The gallbladder is grossly normal.

Pancreas: Unremarkable. No pancreatic ductal dilatation or
surrounding inflammatory changes.

Spleen: Normal in size without focal abnormality.

Adrenals/Urinary Tract: There are multiple left renal calculi,
largest measuring 5 mm. No right-sided calculi. No obstructive
uropathy within either kidney.

Bladder is markedly distended, without filling defect. The adrenals
are normal.

Stomach/Bowel: No bowel obstruction or ileus. No bowel wall
thickening or inflammatory change. The appendix, if still present,
is not well visualized.

Vascular/Lymphatic: No significant atherosclerosis. There is
extrinsic compression on the inferior vena cava by a right pelvic
mass. No pathologic adenopathy.

Reproductive: There is a large predominantly solid right adnexal
mass measuring 9.6 x 10.1 x 6.7 cm, highly concerning for right
ovarian neoplasm. This mass results in extrinsic mass effect upon
the inferior vena cava. Gynecologic consultation recommended. The
uterus is atrophic. Left adnexa is grossly normal.

Other: Trace free fluid right lower quadrant. No free
intraperitoneal gas. No abdominal wall hernia.

Musculoskeletal: There are no acute or destructive bony lesions.
Reconstructed images demonstrate no additional findings.
IMPRESSION: 1. Circumscribed predominantly solid right adnexal mass, highly
concerning for ovarian neoplasm. Gynecologic consultation may be
useful.
2. Extrinsic mass effect on the inferior vena cava due to the right
adnexal mass described above.
3. Trace free fluid right lower quadrant.
4. Minimal right pleural thickening. No pleural effusion or airspace
disease.

## 2022-11-23 ENCOUNTER — Encounter: Payer: Self-pay | Admitting: Family Medicine

## 2022-11-23 ENCOUNTER — Ambulatory Visit (INDEPENDENT_AMBULATORY_CARE_PROVIDER_SITE_OTHER): Payer: Medicare HMO | Admitting: Family Medicine

## 2022-11-23 VITALS — BP 130/82 | HR 82 | Temp 98.2°F | Resp 16 | Ht 62.0 in | Wt 105.2 lb

## 2022-11-23 DIAGNOSIS — R0981 Nasal congestion: Secondary | ICD-10-CM | POA: Diagnosis not present

## 2022-11-23 MED ORDER — AMOXICILLIN-POT CLAVULANATE 875-125 MG PO TABS: 1.0000 | ORAL_TABLET | Freq: Two times a day (BID) | ORAL | 0 refills | Status: DC

## 2022-11-23 NOTE — Patient Instructions (Signed)
Glad your symptoms are improving.  I suspect you either had a viral infection or possible flare of allergies.  If it continues to improve, okay to use Tylenol, saline nasal spray or nasal washes, drink plenty of fluids and rest.  If not continuing to improve or any worsening of the nasal congestion, sinus pressure or tooth pain I recommend starting Augmentin.  That was printed today.  If any continued or recurrent blood in the nasal discharge please be seen.  Hope you feel better soon!

## 2022-11-23 NOTE — Progress Notes (Signed)
Subjective:  Patient ID: Tracey Ayala, female    DOB: 1951-06-13  Age: 72 y.o. MRN: [id number]  CC:  Chief Complaint  Patient presents with   Sinus Problem    Pt states she has pressure in her head, runny nose that presents blood when blowing, states she has a clicking in her left ear. Denies congestion and cough. Symptoms started last Friday.     HPI Tracey Ayala presents for   Sinus congestion: Started 6 days ago - initial congestion, headache on top of head. Bloody nasal discharge, but no epistaxis requiring pressure/cessation.  Left ear clicking with swallowing.  T 99.2 at night.  Tx: antihistamine. Saline rinses. Tylenol.  No tooth or face pain.  Congestion slightly better,  discolored - off white.  Dry throat last night.     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   Past Medical History:  Diagnosis Date   Adnexal mass    Allergy    Receives injections once a week   Anxiety    Basal cell carcinoma 03/04/2013   bcc right nostril =mohs   Cataract    Endometriosis    Essential hypertension 12/23/2021   Fibroma of right ovary 12/15/2020   Hypercholesterolemia    Hypertension    Mitral valve prolapse 12/23/2021   Syncope and collapse 12/23/2021   Wears glasses    Past Surgical History:  Procedure Laterality Date   basal cell carcinoma from left shoulder  2018   mohs procedure  2014   right nostril   ovarian cystectomy and appendectomy Left 1978   ROBOTIC ASSISTED SALPINGO OOPHERECTOMY Bilateral 12/07/2020   Procedure: XI ROBOTIC ASSISTED BILATERAL SALPINGO OOPHORECTOMY, MINI LAPAROTOMY, PELVIC WASHINGS;  Surgeon: Carver Fila, MD;  Location: [facility];  Service: Gynecology;  Laterality: Bilateral;   WISDOM TOOTH EXTRACTION  1974   Allergies  Allergen Reactions   Codeine Nausea Only, Nausea And Vomiting and Other (See Comments)    Sick   Other reaction(s): GI upset nausea  Sick   Other reaction(s): GI upset nausea  Sick   Other reaction(s): GI upset nausea  Sick   Other reaction(s): GI upset nausea   Fosamax [Alendronate] Rash   Prior to Admission medications   Medication Sig Start Date End Date Taking? Authorizing Provider  atorvastatin (LIPITOR) 10 MG tablet TAKE 1 TABLET BY MOUTH EVERY DAY 08/11/22  Yes Shade Flood, MD  Cholecalciferol (VITAMIN D) 50 MCG (2000 UT) CAPS Take 2,000 Units by mouth daily. Vitamin d 3   Yes [provider]  citalopram (CELEXA) 20 MG tablet Take 1 tablet (20 mg total) by mouth every evening. 08/11/22  Yes Shade Flood, MD  clonazePAM (KLONOPIN) 0.5 MG tablet TAKE 1 TABLET BY MOUTH EVERY DAY 11/05/22  Yes Shade Flood, MD  EPINEPHrine 0.3 mg/0.3 mL IJ SOAJ injection Inject 0.3 mg into the muscle as needed for anaphylaxis.   Yes [provider]  lisinopril (ZESTRIL) 10 MG tablet Take 1 tablet (10 mg total) by mouth daily. 08/11/22  Yes Shade Flood, MD  PRESCRIPTION MEDICATION Allergy Injections once a week.   Yes [provider]   Social History   Socioeconomic History  Marital status: Widowed    Spouse name: Not on file   Number of children: Not on file   Years of education: Not on file   Highest education level: Not on file  Occupational History   Occupation: retired  Tobacco Use   Smoking status: Never   Smokeless tobacco: Never  Vaping Use   Vaping Use: Never used  Substance and Sexual Activity   Alcohol use: Never   Drug use: Never   Sexual activity: Not Currently  Other Topics Concern   Not on file  Social History Narrative   Not on file   Social Determinants of Health   Financial Resource Strain: Low Risk   (01/06/2022)   Overall Financial Resource Strain (CARDIA)    Difficulty of Paying Living Expenses: Not hard at all  Food Insecurity: No Food Insecurity (01/06/2022)   Hunger Vital Sign    Worried About Running Out of Food in the Last Year: Never true    Ran Out of Food in the Last Year: Never true  Transportation Needs: No Transportation Needs (01/06/2022)   PRAPARE - Administrator, Civil Service (Medical): No    Lack of Transportation (Non-Medical): No  Physical Activity: Sufficiently Active (01/06/2022)   Exercise Vital Sign    Days of Exercise per Week: 5 days    Minutes of Exercise per Session: 60 min  Stress: No Stress Concern Present (01/06/2022)   [employer] of Occupational Health - Occupational Stress Questionnaire    Feeling of Stress : Not at all  Social Connections: Moderately Isolated (01/06/2022)   Social Connection and Isolation Panel [NHANES]    Frequency of Communication with Friends and Family: More than three times a week    Frequency of Social Gatherings with Friends and Family: More than three times a week    Attends Religious Services: Never    Database administrator or Organizations: Yes    Attends Engineer, structural: More than 4 times per year    Marital Status: Widowed  Intimate Partner Violence: Not At Risk (01/06/2022)   Humiliation, Afraid, Rape, and Kick questionnaire    Fear of Current or Ex-Partner: No    Emotionally Abused: No    Physically Abused: No    Sexually Abused: No    Review of Systems Per HPI  Objective:   Vitals:   11/23/22 1149  BP: 130/82  Pulse: 82  Resp: 16  Temp: 98.2 F (36.8 C)  TempSrc: Temporal  SpO2: 96%  Weight: 105 lb 3.2 oz (47.7 kg)  Height: 5\' 2"  (1.575 m)    Physical Exam Vitals reviewed.  Constitutional:      General: She is not in acute distress.    Appearance: She is well-developed.  HENT:     Head: Normocephalic and atraumatic.     Right Ear: Hearing, tympanic membrane, ear  canal and external ear normal.     Left Ear: Hearing, tympanic membrane, ear canal and external ear normal.     Nose: Nose normal.     Comments: Sinuses nontender. Small amoutn crusting R nasal passage, no bleeding or wounds. No dried blood in nares.      Mouth/Throat:     Pharynx: No posterior oropharyngeal erythema.  Eyes:     Conjunctiva/sclera: Conjunctivae normal.     Pupils: Pupils are equal, round, and reactive to light.  Cardiovascular:     Rate and Rhythm: Normal rate and regular rhythm.     Heart sounds: Normal  heart sounds. No murmur heard. Pulmonary:     Effort: Pulmonary effort is normal. No respiratory distress.     Breath sounds: Normal breath sounds. No wheezing or rhonchi.  Skin:    General: Skin is warm and dry.     Findings: No rash.  Neurological:     Mental Status: She is alert and oriented to person, place, and time.  Psychiatric:        Mood and Affect: Mood normal.        Behavior: Behavior normal.        Assessment & Plan:  Tracey Ayala is a 72 y.o. female . Sinus congestion - Plan: amoxicillin-clavulanate (AUGMENTIN) 875-125 MG tablet Viral versus allergic cause, less likely bacterial sinusitis at this time.  Initial blood-tinged nasal discharge, no sign of bleeding currently or wounds.  Blood within nasal discharge has improved.  Continue symptomatic care with fluids, saline nasal spray or saline wash, with Augmentin printed if not continuing to improve or secondary worsening for treatment of sinusitis.  RTC precautions given.  Potential side effects and risks of antibiotics discussed.  Meds ordered this encounter  Medications   amoxicillin-clavulanate (AUGMENTIN) 875-125 MG tablet    Sig: Take 1 tablet by mouth 2 (two) times daily.    Dispense:  20 tablet    Refill:  0   Patient Instructions  Glad your symptoms are improving.  I suspect you either had a viral infection or possible flare of allergies.  If it continues to improve, okay to use  Tylenol, saline nasal spray or nasal washes, drink plenty of fluids and rest.  If not continuing to improve or any worsening of the nasal congestion, sinus pressure or tooth pain I recommend starting Augmentin.  That was printed today.  If any continued or recurrent blood in the nasal discharge please be seen.  Hope you feel better soon!    Signed,   Meredith StaggersJeffrey Machaela Caterino, MD [facility], [facility] 11/23/22 12:45 PM

## 2022-11-24 ENCOUNTER — Ambulatory Visit: Payer: Medicare HMO | Admitting: Family Medicine

## 2022-11-28 DIAGNOSIS — J3081 Allergic rhinitis due to animal (cat) (dog) hair and dander: Secondary | ICD-10-CM | POA: Diagnosis not present

## 2022-11-28 DIAGNOSIS — J3089 Other allergic rhinitis: Secondary | ICD-10-CM | POA: Diagnosis not present

## 2022-11-28 DIAGNOSIS — J301 Allergic rhinitis due to pollen: Secondary | ICD-10-CM | POA: Diagnosis not present

## 2022-11-30 ENCOUNTER — Telehealth: Payer: Self-pay | Admitting: Family Medicine

## 2022-11-30 NOTE — Telephone Encounter (Signed)
Contacted Tracey Ayala to schedule their annual wellness visit. Appointment made for 12/01/2022.  Thank you,  [facility] Support [facility] Direct dial  [phone]

## 2022-12-05 DIAGNOSIS — J301 Allergic rhinitis due to pollen: Secondary | ICD-10-CM | POA: Diagnosis not present

## 2022-12-05 DIAGNOSIS — J3081 Allergic rhinitis due to animal (cat) (dog) hair and dander: Secondary | ICD-10-CM | POA: Diagnosis not present

## 2022-12-05 DIAGNOSIS — J3089 Other allergic rhinitis: Secondary | ICD-10-CM | POA: Diagnosis not present

## 2022-12-09 DIAGNOSIS — N200 Calculus of kidney: Secondary | ICD-10-CM | POA: Diagnosis not present

## 2022-12-09 DIAGNOSIS — R3121 Asymptomatic microscopic hematuria: Secondary | ICD-10-CM | POA: Diagnosis not present

## 2022-12-12 DIAGNOSIS — R3121 Asymptomatic microscopic hematuria: Secondary | ICD-10-CM | POA: Diagnosis not present

## 2022-12-12 DIAGNOSIS — J3089 Other allergic rhinitis: Secondary | ICD-10-CM | POA: Diagnosis not present

## 2022-12-12 DIAGNOSIS — J3081 Allergic rhinitis due to animal (cat) (dog) hair and dander: Secondary | ICD-10-CM | POA: Diagnosis not present

## 2022-12-12 DIAGNOSIS — J301 Allergic rhinitis due to pollen: Secondary | ICD-10-CM | POA: Diagnosis not present

## 2022-12-16 ENCOUNTER — Other Ambulatory Visit: Payer: Self-pay | Admitting: Adult Health

## 2022-12-16 DIAGNOSIS — D4101 Neoplasm of uncertain behavior of right kidney: Secondary | ICD-10-CM

## 2022-12-20 DIAGNOSIS — J301 Allergic rhinitis due to pollen: Secondary | ICD-10-CM | POA: Diagnosis not present

## 2022-12-20 DIAGNOSIS — J3089 Other allergic rhinitis: Secondary | ICD-10-CM | POA: Diagnosis not present

## 2022-12-22 ENCOUNTER — Encounter: Payer: Self-pay | Admitting: Adult Health

## 2022-12-26 DIAGNOSIS — J3089 Other allergic rhinitis: Secondary | ICD-10-CM | POA: Diagnosis not present

## 2022-12-26 DIAGNOSIS — J3081 Allergic rhinitis due to animal (cat) (dog) hair and dander: Secondary | ICD-10-CM | POA: Diagnosis not present

## 2022-12-26 DIAGNOSIS — J301 Allergic rhinitis due to pollen: Secondary | ICD-10-CM | POA: Diagnosis not present

## 2022-12-27 DIAGNOSIS — L821 Other seborrheic keratosis: Secondary | ICD-10-CM | POA: Diagnosis not present

## 2022-12-27 DIAGNOSIS — D224 Melanocytic nevi of scalp and neck: Secondary | ICD-10-CM | POA: Diagnosis not present

## 2022-12-27 DIAGNOSIS — L814 Other melanin hyperpigmentation: Secondary | ICD-10-CM | POA: Diagnosis not present

## 2022-12-27 DIAGNOSIS — L570 Actinic keratosis: Secondary | ICD-10-CM | POA: Diagnosis not present

## 2022-12-27 DIAGNOSIS — C44319 Basal cell carcinoma of skin of other parts of face: Secondary | ICD-10-CM | POA: Diagnosis not present

## 2022-12-27 DIAGNOSIS — Z85828 Personal history of other malignant neoplasm of skin: Secondary | ICD-10-CM | POA: Diagnosis not present

## 2022-12-27 DIAGNOSIS — D1801 Hemangioma of skin and subcutaneous tissue: Secondary | ICD-10-CM | POA: Diagnosis not present

## 2023-01-03 ENCOUNTER — Encounter: Payer: Self-pay | Admitting: Family Medicine

## 2023-01-03 DIAGNOSIS — J3089 Other allergic rhinitis: Secondary | ICD-10-CM | POA: Diagnosis not present

## 2023-01-03 DIAGNOSIS — J301 Allergic rhinitis due to pollen: Secondary | ICD-10-CM | POA: Diagnosis not present

## 2023-01-03 DIAGNOSIS — C44311 Basal cell carcinoma of skin of nose: Secondary | ICD-10-CM | POA: Insufficient documentation

## 2023-01-03 DIAGNOSIS — H1045 Other chronic allergic conjunctivitis: Secondary | ICD-10-CM | POA: Diagnosis not present

## 2023-01-05 DIAGNOSIS — J301 Allergic rhinitis due to pollen: Secondary | ICD-10-CM | POA: Diagnosis not present

## 2023-01-05 DIAGNOSIS — J3089 Other allergic rhinitis: Secondary | ICD-10-CM | POA: Diagnosis not present

## 2023-01-05 DIAGNOSIS — J3081 Allergic rhinitis due to animal (cat) (dog) hair and dander: Secondary | ICD-10-CM | POA: Diagnosis not present

## 2023-01-10 DIAGNOSIS — J3089 Other allergic rhinitis: Secondary | ICD-10-CM | POA: Diagnosis not present

## 2023-01-10 DIAGNOSIS — J3081 Allergic rhinitis due to animal (cat) (dog) hair and dander: Secondary | ICD-10-CM | POA: Diagnosis not present

## 2023-01-10 DIAGNOSIS — J301 Allergic rhinitis due to pollen: Secondary | ICD-10-CM | POA: Diagnosis not present

## 2023-01-13 DIAGNOSIS — J3089 Other allergic rhinitis: Secondary | ICD-10-CM | POA: Diagnosis not present

## 2023-01-13 DIAGNOSIS — J3081 Allergic rhinitis due to animal (cat) (dog) hair and dander: Secondary | ICD-10-CM | POA: Diagnosis not present

## 2023-01-13 DIAGNOSIS — J301 Allergic rhinitis due to pollen: Secondary | ICD-10-CM | POA: Diagnosis not present

## 2023-01-17 DIAGNOSIS — J3089 Other allergic rhinitis: Secondary | ICD-10-CM | POA: Diagnosis not present

## 2023-01-17 DIAGNOSIS — J3081 Allergic rhinitis due to animal (cat) (dog) hair and dander: Secondary | ICD-10-CM | POA: Diagnosis not present

## 2023-01-17 DIAGNOSIS — J301 Allergic rhinitis due to pollen: Secondary | ICD-10-CM | POA: Diagnosis not present

## 2023-01-23 ENCOUNTER — Ambulatory Visit
Admission: RE | Admit: 2023-01-23 | Discharge: 2023-01-23 | Disposition: A | Discharge status: Home / Self Care | Payer: Medicare HMO | Source: Ambulatory Visit | Attending: Adult Health | Admitting: Adult Health

## 2023-01-23 DIAGNOSIS — J3089 Other allergic rhinitis: Secondary | ICD-10-CM | POA: Diagnosis not present

## 2023-01-23 DIAGNOSIS — N281 Cyst of kidney, acquired: Secondary | ICD-10-CM | POA: Diagnosis not present

## 2023-01-23 DIAGNOSIS — J301 Allergic rhinitis due to pollen: Secondary | ICD-10-CM | POA: Diagnosis not present

## 2023-01-23 DIAGNOSIS — J3081 Allergic rhinitis due to animal (cat) (dog) hair and dander: Secondary | ICD-10-CM | POA: Diagnosis not present

## 2023-01-23 DIAGNOSIS — D4101 Neoplasm of uncertain behavior of right kidney: Secondary | ICD-10-CM

## 2023-01-23 MED ORDER — GADOPICLENOL 0.5 MMOL/ML IV SOLN
5.0000 mL | Freq: Once | INTRAVENOUS | Status: AC | PRN
  Administered 2023-01-23: 5 mL via INTRAVENOUS

## 2023-01-25 ENCOUNTER — Ambulatory Visit (INDEPENDENT_AMBULATORY_CARE_PROVIDER_SITE_OTHER): Payer: Medicare HMO | Admitting: *Deleted

## 2023-01-25 DIAGNOSIS — Z0000 Encounter for general adult medical examination without abnormal findings: Secondary | ICD-10-CM | POA: Diagnosis not present

## 2023-01-25 NOTE — Progress Notes (Signed)
Subjective:   Tracey Ayala is a 73 y.o. female who presents for Medicare Annual (Subsequent) preventive examination.  I connected with  Margarito Liner on 01/25/23 by a telephone enabled telemedicine application and verified that I am speaking with the correct person using two identifiers.   I discussed the limitations of evaluation and management by telemedicine. The patient expressed understanding and agreed to proceed.  Patient location: home  Provider location: telephone/home    Review of Systems     Cardiac Risk Factors include: advanced age (>21men, >45 women);hypertension     Objective:    Today's Vitals   There is no height or weight on file to calculate BMI.     01/25/2023   11:09 AM 01/06/2022   11:34 AM 11/04/2021    4:06 PM 12/25/2020    4:15 PM 12/07/2020    9:17 AM 12/01/2020   10:25 AM  Advanced Directives  Does Patient Have a Medical Advance Directive? Yes Yes No Yes Yes No  Type of Advance Directive Healthcare Power of [employer] Power of Attorney      Does patient want to make changes to medical advance directive?     No - Patient declined   Copy of Healthcare Power of Attorney in Chart? Yes - validated most recent copy scanned in chart (See row information) Yes - validated most recent copy scanned in chart (See row information)      Would patient like information on creating a medical advance directive?   No - Patient declined  No - Patient declined Yes (MAU/Ambulatory/Procedural Areas - Information given)    Current Medications (verified) Outpatient Encounter Medications as of 01/25/2023  Medication Sig   atorvastatin (LIPITOR) 10 MG tablet TAKE 1 TABLET BY MOUTH EVERY DAY   Cholecalciferol (VITAMIN D) 50 MCG (2000 UT) CAPS Take 2,000 Units by mouth daily. Vitamin d 3   citalopram (CELEXA) 20 MG tablet Take 1 tablet (20 mg total) by mouth every evening.   clonazePAM (KLONOPIN) 0.5 MG tablet TAKE 1 TABLET BY MOUTH EVERY DAY   EPINEPHrine  0.3 mg/0.3 mL IJ SOAJ injection Inject 0.3 mg into the muscle as needed for anaphylaxis.   lisinopril (ZESTRIL) 10 MG tablet Take 1 tablet (10 mg total) by mouth daily.   PRESCRIPTION MEDICATION Allergy Injections once a week.   amoxicillin-clavulanate (AUGMENTIN) 875-125 MG tablet Take 1 tablet by mouth 2 (two) times daily.   No facility-administered encounter medications on file as of 01/25/2023.    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/15/2020   Hypercholesterolemia    Hypertension    Mitral valve prolapse 12/23/2021   Syncope and collapse 12/23/2021   Wears glasses    Past Surgical History:  Procedure Laterality Date   basal cell carcinoma from left shoulder  2018   mohs procedure  2014   right nostril   ovarian cystectomy and appendectomy Left 1978   ROBOTIC ASSISTED SALPINGO OOPHERECTOMY Bilateral 12/07/2020   Procedure: XI ROBOTIC ASSISTED BILATERAL SALPINGO OOPHORECTOMY, MINI LAPAROTOMY, PELVIC WASHINGS;  Surgeon: Carver Fila, MD;  Location: [facility] [place];  Service: Gynecology;  Laterality: Bilateral;   WISDOM  TOOTH EXTRACTION  1974   Family History  Problem Relation Age of Onset   Stroke Mother    Hypertension Mother    Hyperlipidemia Mother    Diabetes Mother    Stroke Father    Early death Father    Hypertension Father    Hyperlipidemia Father    Arthritis Sister    Cancer Sister        carcinoid tumor of the appendix   Colon cancer Maternal Aunt    Breast cancer Maternal Aunt    Heart failure Maternal Grandmother    Stroke Maternal Grandmother    Endometrial cancer Neg Hx    Ovarian cancer Neg Hx    Pancreatic cancer Neg Hx    Prostate cancer  Neg Hx    Social History   Socioeconomic History   Marital status: Widowed    Spouse name: Not on file   Number of children: Not on file   Years of education: Not on file   Highest education level: Not on file  Occupational History   Occupation: retired  Tobacco Use   Smoking status: Never   Smokeless tobacco: Never  Vaping Use   Vaping Use: Never used  Substance and Sexual Activity   Alcohol use: Never   Drug use: Never   Sexual activity: Not Currently  Other Topics Concern   Not on file  Social History Narrative   Not on file   Social Determinants of Health   Financial Resource Strain: Low Risk  (01/25/2023)   Overall Financial Resource Strain (CARDIA)    Difficulty of Paying Living Expenses: Not hard at all  Food Insecurity: No Food Insecurity (01/25/2023)   Hunger Vital Sign    Worried About Running Out of Food in the Last Year: Never true    Ran Out of Food in the Last Year: Never true  Transportation Needs: No Transportation Needs (01/25/2023)   PRAPARE - Administrator, Civil Service (Medical): No    Lack of Transportation (Non-Medical): No  Physical Activity: Sufficiently Active (01/25/2023)   Exercise Vital Sign    Days of Exercise per Week: 4 days    Minutes of Exercise per Session: 60 min  Stress: No Stress Concern Present (01/25/2023)   [employer] of Occupational Health - Occupational Stress Questionnaire    Feeling of Stress : Not at all  Social Connections: Moderately Integrated (01/25/2023)   Social Connection and Isolation Panel [NHANES]    Frequency of Communication with Friends and Family: More than three times a week    Frequency of Social Gatherings with Friends and Family: More than three times a week    Attends Religious Services: More than 4 times per year    Active Member of [employer] or Organizations: Yes    Attends Banker Meetings: More than 4 times per year    Marital Status: Widowed    Tobacco  Counseling Counseling given: Not Answered   Clinical Intake:  Pre-visit preparation completed: Yes  Pain : No/denies pain     Nutritional Risks: None Diabetes: No  How often do you need to have someone help you when you read instructions, pamphlets, or other written materials from your doctor or pharmacy?: 1 - Never  Diabetic?  no  Interpreter Needed?: No  Information entered by :: Remi Haggard LPN   Activities of Daily Living    01/25/2023   11:07 AM 01/21/2023    5:13 PM  In your present state of health, do  you have any difficulty performing the following activities:  Hearing? 0 0  Vision? 0 0  Difficulty concentrating or making decisions? 0 0  Walking or climbing stairs? 0 0  Dressing or bathing? 0 0  Doing errands, shopping? 0 0  Preparing Food and eating ? N N  Using the Toilet? N N  In the past six months, have you accidently leaked urine? N N  Do you have problems with loss of bowel control? N N  Managing your Medications? N N  Managing your Finances? N N  Housekeeping or managing your Housekeeping? N N    Patient Care Team: Shade Flood, MD as PCP - General (Family Medicine) Janalyn Harder, MD (Inactive) as Consulting Physician (Dermatology)  Indicate any recent Medical Services you may have received from other than [facility] providers in the past year (date may be approximate).     Assessment:   This is a routine wellness examination for Fonda.  Hearing/Vision screen Hearing Screening - Comments:: No trouble hearing Vision Screening - Comments:: [place] Up to date  Dietary issues and exercise activities discussed: Current Exercise Habits: Home exercise routine;Structured exercise class, Type of exercise: walking;strength training/weights, Time (Minutes): 40, Frequency (Times/Week): 4, Weekly Exercise (Minutes/Week): 160, Intensity: Moderate   Goals Addressed             This Visit's Progress    Patient Stated   On track    Keep working on bone  building exercise      Patient Stated       Would like to exercise class like dancing Improve memory       Depression Screen    01/25/2023   11:12 AM 11/23/2022   11:57 AM 08/11/2022   10:11 AM 02/03/2022   11:17 AM 01/06/2022   11:41 AM 11/04/2021   10:42 AM 07/30/2021    9:20 AM  PHQ 2/9 Scores  PHQ - 2 Score 0 0 0 0 0 0 0  PHQ- 9 Score 0 1 1    1     Fall Risk    01/25/2023   11:10 AM 01/21/2023    5:13 PM 11/23/2022   11:57 AM 08/11/2022   10:11 AM 02/03/2022   11:16 AM  Fall Risk   Falls in the past year? 0 0 0 0 0  Number falls in past yr: 0  0 0 0  Injury with Fall? 0  0 0 0  Risk for fall due to :   No Fall Risks No Fall Risks No Fall Risks  Follow up Falls evaluation completed;Education provided;Falls prevention discussed   Falls evaluation completed Falls evaluation completed    FALL RISK PREVENTION PERTAINING TO THE HOME:  Any stairs in or around the home? No  If so, are there any without handrails? No  Home free of loose throw rugs in walkways, pet beds, electrical cords, etc? Yes  Adequate lighting in your home to reduce risk of falls? Yes   ASSISTIVE DEVICES UTILIZED TO PREVENT FALLS:  Life alert? No  Use of a cane, walker or w/c? No  Grab bars in the bathroom? Yes  Shower chair or bench in shower? No  Elevated toilet seat or a handicapped toilet? No   TIMED UP AND GO:  Was the test performed? No .    Cognitive Function:        01/25/2023   11:06 AM 01/06/2022   11:35 AM  6CIT Screen  What Year? 0 points 0 points  What month?  0 points 0 points  What time? 0 points 0 points  Count back from 20 0 points 0 points  Months in reverse 0 points 0 points  Repeat phrase 0 points 0 points  Total Score 0 points 0 points    Immunizations Immunization History  Administered Date(s) Administered   Influenza Split 06/25/2009, 05/18/2010, 05/23/2012, 06/13/2014, 05/18/2016   Influenza, High Dose Seasonal PF 06/22/2017, 06/28/2018, 06/04/2019,  06/24/2019, 06/23/2020   Influenza,inj,Quad PF,6+ Mos 05/22/2013   Influenza,inj,quad, With Preservative 06/01/2015   Pneumococcal Conjugate-13 02/01/2017   Pneumococcal Polysaccharide-23 06/22/2017, 04/05/2018, 06/28/2018, 06/23/2020   Td 04/05/2018   Tdap 11/06/2008, 08/03/2018   Zoster, Live 01/02/2012, 04/28/2018, 08/26/2018   Zoster, Unspecified 06/28/2022    TDAP status: Up to date  Flu Vaccine status: Up to date  Pneumococcal vaccine status: Up to date  Covid-19 vaccine status: Information provided on how to obtain vaccines.   Qualifies for Shingles Vaccine? No   Zostavax completed Yes   Shingrix Completed?: Yes  Screening Tests Health Maintenance  Topic Date Due   Hepatitis C Screening  Never done   DEXA SCAN  02/04/2023 (Originally 08/11/2016)   COVID-19 Vaccine (1) 02/10/2023 (Originally 08/11/1956)   Zoster Vaccines- Shingrix (1 of 2) 04/27/2023 (Originally [birth year]1970)   INFLUENZA VACCINE  03/16/2023   Medicare Annual Wellness (AWV)  01/25/2024   MAMMOGRAM  03/02/2024   DTaP/Tdap/Td (4 - Td or Tdap) 08/03/2028   Colonoscopy  01/19/2030   Pneumonia Vaccine 80+ Years old  Completed   HPV VACCINES  Aged Out    Health Maintenance  Health Maintenance Due  Topic Date Due   Hepatitis C Screening  Never done    Colorectal cancer screening: Type of screening: Colonoscopy. Completed 2021 . Repeat every 5 years  Mammogram status: Completed  . Repeat every year  Bone Density status: Ordered  . Pt provided with contact info and advised to call to schedule appt.   Gynecologist ordered  Lung Cancer Screening: (Low Dose CT Chest recommended if Age 31-80 years, 30 pack-year currently smoking OR have quit w/in 15years.) does not qualify.   Lung Cancer Screening Referral:   Additional Screening:  Hepatitis C Screening:  never done  Vision Screening: Recommended annual ophthalmology exams for early detection of glaucoma and other disorders of the eye. Is the  patient up to date with their annual eye exam?  Yes  Who is the provider or what is the name of the office in which the patient attends annual eye exams? [place] If pt is not established with a provider, would they like to be referred to a provider to establish care? No .   Dental Screening: Recommended annual dental exams for proper oral hygiene  Community Resource Referral / Chronic Care Management: CRR required this visit?  No   CCM required this visit?  No      Plan:     I have personally reviewed and noted the following in the patient's chart:   Medical and social history Use of alcohol, tobacco or illicit drugs  Current medications and supplements including opioid prescriptions. Patient is not currently taking opioid prescriptions. Functional ability and status Nutritional status Physical activity Advanced directives List of other physicians Hospitalizations, surgeries, and ER visits in previous 12 months Vitals Screenings to include cognitive, depression, and falls Referrals and appointments  In addition, I have reviewed and discussed with patient certain preventive protocols, quality metrics, and best practice recommendations. A written personalized care plan for preventive services as well as general  preventive health recommendations were provided to patient.     Remi Haggard, LPN   04/28/7828   Nurse Notes:

## 2023-01-25 NOTE — Patient Instructions (Signed)
Tracey Ayala , Thank you for taking time to come for your Medicare Wellness Visit. I appreciate your ongoing commitment to your health goals. Please review the following plan we discussed and let me know if I can assist you in the future.   Screening recommendations/referrals: Colonoscopy: up to date Mammogram: up to date Bone Density: up to date Recommended yearly ophthalmology/optometry visit for glaucoma screening and checkup Recommended yearly dental visit for hygiene and checkup  Vaccinations: Influenza vaccine: up to date Pneumococcal vaccine: up to date Tdap vaccine: up to date Shingles vaccine: up to date    Advanced directives: Education provided    Preventive Care 65 Years and Older, Female Preventive care refers to lifestyle choices and visits with your health care provider that can promote health and wellness. What does preventive care include? A yearly physical exam. This is also called an annual well check. Dental exams once or twice a year. Routine eye exams. Ask your health care provider how often you should have your eyes checked. Personal lifestyle choices, including: Daily care of your teeth and gums. Regular physical activity. Eating a healthy diet. Avoiding tobacco and drug use. Limiting alcohol use. Practicing safe sex. Taking low-dose aspirin every day. Taking vitamin and mineral supplements as recommended by your health care provider. What happens during an annual well check? The services and screenings done by your health care provider during your annual well check will depend on your age, overall health, lifestyle risk factors, and family history of disease. Counseling  Your health care provider may ask you questions about your: Alcohol use. Tobacco use. Drug use. Emotional well-being. Home and relationship well-being. Sexual activity. Eating habits. History of falls. Memory and ability to understand (cognition). Work and work  Astronomer. Reproductive health. Screening  You may have the following tests or measurements: Height, weight, and BMI. Blood pressure. Lipid and cholesterol levels. These may be checked every 5 years, or more frequently if you are over 72 years old. Skin check. Lung cancer screening. You may have this screening every year starting at age 72 if you have a 30-pack-year history of smoking and currently smoke or have quit within the past 15 years. Fecal occult blood test (FOBT) of the stool. You may have this test every year starting at age 72. Flexible sigmoidoscopy or colonoscopy. You may have a sigmoidoscopy every 5 years or a colonoscopy every 10 years starting at age 72. Hepatitis C blood test. Hepatitis B blood test. Sexually transmitted disease (STD) testing. Diabetes screening. This is done by checking your blood sugar (glucose) after you have not eaten for a while (fasting). You may have this done every 1-3 years. Bone density scan. This is done to screen for osteoporosis. You may have this done starting at age 72. Mammogram. This may be done every 1-2 years 72. Talk to your health care provider about how often you should have regular mammograms. Talk with your health care provider about your test results, treatment options, and if necessary, the need for more tests. Vaccines  Your health care provider may recommend certain vaccines, such as: Influenza vaccine. This is recommended every year. Tetanus, diphtheria, and acellular pertussis (Tdap, Td) vaccine. You may need a Td booster every 10 years. Zoster vaccine. You may need this after age 72. Pneumococcal 13-valent conjugate (PCV13) vaccine. One dose is recommended after age 72. Pneumococcal polysaccharide (PPSV23) vaccine. One dose is recommended after age 72. Talk to your health care provider about which screenings and vaccines you need and how  often you need them. This information is not intended to replace advice given to you by  your health care provider. Make sure you discuss any questions you have with your health care provider. Document Released: 08/28/2015 Document Revised: 04/20/2016 Document Reviewed: 06/02/2015 Elsevier Interactive Patient Education  2017 [employer].  Fall Prevention in the Home Falls can cause injuries. They can happen to people of all ages. There are many things you can do to make your home safe and to help prevent falls. What can I do on the outside of my home? Regularly fix the edges of walkways and driveways and fix any cracks. Remove anything that might make you trip as you walk through a door, such as a raised step or threshold. Trim any bushes or trees on the path to your home. Use bright outdoor lighting. Clear any walking paths of anything that might make someone trip, such as rocks or tools. Regularly check to see if handrails are loose or broken. Make sure that both sides of any steps have handrails. Any raised decks and porches should have guardrails on the edges. Have any leaves, snow, or ice cleared regularly. Use sand or salt on walking paths during winter. Clean up any spills in your garage right away. This includes oil or grease spills. What can I do in the bathroom? Use night lights. Install grab bars by the toilet and in the tub and shower. Do not use towel bars as grab bars. Use non-skid mats or decals in the tub or shower. If you need to sit down in the shower, use a plastic, non-slip stool. Keep the floor dry. Clean up any water that spills on the floor as soon as it happens. Remove soap buildup in the tub or shower regularly. Attach bath mats securely with double-sided non-slip rug tape. Do not have throw rugs and other things on the floor that can make you trip. What can I do in the bedroom? Use night lights. Make sure that you have a light by your bed that is easy to reach. Do not use any sheets or blankets that are too big for your bed. They should not hang  down onto the floor. Have a firm chair that has side arms. You can use this for support while you get dressed. Do not have throw rugs and other things on the floor that can make you trip. What can I do in the kitchen? Clean up any spills right away. Avoid walking on wet floors. Keep items that you use a lot in easy-to-reach places. If you need to reach something above you, use a strong step stool that has a grab bar. Keep electrical cords out of the way. Do not use floor polish or wax that makes floors slippery. If you must use wax, use non-skid floor wax. Do not have throw rugs and other things on the floor that can make you trip. What can I do with my stairs? Do not leave any items on the stairs. Make sure that there are handrails on both sides of the stairs and use them. Fix handrails that are broken or loose. Make sure that handrails are as long as the stairways. Check any carpeting to make sure that it is firmly attached to the stairs. Fix any carpet that is loose or worn. Avoid having throw rugs at the top or bottom of the stairs. If you do have throw rugs, attach them to the floor with carpet tape. Make sure that you have a  light switch at the top of the stairs and the bottom of the stairs. If you do not have them, ask someone to add them for you. What else can I do to help prevent falls? Wear shoes that: Do not have high heels. Have rubber bottoms. Are comfortable and fit you well. Are closed at the toe. Do not wear sandals. If you use a stepladder: Make sure that it is fully opened. Do not climb a closed stepladder. Make sure that both sides of the stepladder are locked into place. Ask someone to hold it for you, if possible. Clearly mark and make sure that you can see: Any grab bars or handrails. First and last steps. Where the edge of each step is. Use tools that help you move around (mobility aids) if they are needed. These  include: Canes. Walkers. Scooters. Crutches. Turn on the lights when you go into a dark area. Replace any light bulbs as soon as they burn out. Set up your furniture so you have a clear path. Avoid moving your furniture around. If any of your floors are uneven, fix them. If there are any pets around you, be aware of where they are. Review your medicines with your doctor. Some medicines can make you feel dizzy. This can increase your chance of falling. Ask your doctor what other things that you can do to help prevent falls. This information is not intended to replace advice given to you by your health care provider. Make sure you discuss any questions you have with your health care provider. Document Released: 05/28/2009 Document Revised: 01/07/2016 Document Reviewed: 09/05/2014 Elsevier Interactive Patient Education  2017 [employer].

## 2023-01-27 ENCOUNTER — Encounter: Payer: Medicare HMO | Admitting: Family Medicine

## 2023-01-30 DIAGNOSIS — J301 Allergic rhinitis due to pollen: Secondary | ICD-10-CM | POA: Diagnosis not present

## 2023-01-30 DIAGNOSIS — J3081 Allergic rhinitis due to animal (cat) (dog) hair and dander: Secondary | ICD-10-CM | POA: Diagnosis not present

## 2023-01-30 DIAGNOSIS — J3089 Other allergic rhinitis: Secondary | ICD-10-CM | POA: Diagnosis not present

## 2023-02-02 DIAGNOSIS — C4401 Basal cell carcinoma of skin of lip: Secondary | ICD-10-CM | POA: Diagnosis not present

## 2023-02-02 DIAGNOSIS — C4491 Basal cell carcinoma of skin, unspecified: Secondary | ICD-10-CM | POA: Insufficient documentation

## 2023-02-06 DIAGNOSIS — J301 Allergic rhinitis due to pollen: Secondary | ICD-10-CM | POA: Diagnosis not present

## 2023-02-06 DIAGNOSIS — J3089 Other allergic rhinitis: Secondary | ICD-10-CM | POA: Diagnosis not present

## 2023-02-06 DIAGNOSIS — J3081 Allergic rhinitis due to animal (cat) (dog) hair and dander: Secondary | ICD-10-CM | POA: Diagnosis not present

## 2023-02-09 ENCOUNTER — Ambulatory Visit (INDEPENDENT_AMBULATORY_CARE_PROVIDER_SITE_OTHER): Payer: Medicare HMO | Admitting: Family Medicine

## 2023-02-09 ENCOUNTER — Encounter: Payer: Self-pay | Admitting: Family Medicine

## 2023-02-09 VITALS — BP 124/74 | HR 77 | Temp 97.8°F | Ht 62.0 in | Wt 103.8 lb

## 2023-02-09 DIAGNOSIS — E785 Hyperlipidemia, unspecified: Secondary | ICD-10-CM | POA: Diagnosis not present

## 2023-02-09 DIAGNOSIS — I10 Essential (primary) hypertension: Secondary | ICD-10-CM | POA: Diagnosis not present

## 2023-02-09 DIAGNOSIS — F419 Anxiety disorder, unspecified: Secondary | ICD-10-CM | POA: Diagnosis not present

## 2023-02-09 DIAGNOSIS — R7303 Prediabetes: Secondary | ICD-10-CM | POA: Diagnosis not present

## 2023-02-09 DIAGNOSIS — G4700 Insomnia, unspecified: Secondary | ICD-10-CM

## 2023-02-09 LAB — HEMOGLOBIN A1C: Hgb A1c MFr Bld: 5.6 % (ref 4.6–6.5)

## 2023-02-09 LAB — LIPID PANEL
Cholesterol: 184 mg/dL (ref 0–200)
HDL: 79.9 mg/dL (ref >39.00)
LDL Cholesterol: 80 mg/dL (ref 0–99)
NonHDL: 103.61
Total CHOL/HDL Ratio: 2
Triglycerides: 117 mg/dL (ref 0.0–149.0)
VLDL: 23.4 mg/dL (ref 0.0–40.0)

## 2023-02-09 LAB — COMPREHENSIVE METABOLIC PANEL
ALT: 20 U/L (ref 0–35)
AST: 29 U/L (ref 0–37)
Albumin: 4.4 g/dL (ref 3.5–5.2)
Alkaline Phosphatase: 72 U/L (ref 39–117)
BUN: 10 mg/dL (ref 6–23)
CO2: 34 mEq/L — ABNORMAL HIGH (ref 19–32)
Calcium: 10.1 mg/dL (ref 8.4–10.5)
Chloride: 101 mEq/L (ref 96–112)
Creatinine, Ser: 0.76 mg/dL (ref 0.40–1.20)
GFR: 78.79 mL/min (ref >60.00)
Glucose, Bld: 91 mg/dL (ref 70–99)
Potassium: 3.8 mEq/L (ref 3.5–5.1)
Sodium: 141 mEq/L (ref 135–145)
Total Bilirubin: 0.8 mg/dL (ref 0.2–1.2)
Total Protein: 6.8 g/dL (ref 6.0–8.3)

## 2023-02-09 MED ORDER — LISINOPRIL 10 MG PO TABS: 10.0000 mg | ORAL_TABLET | Freq: Every day | ORAL | 3 refills | Status: DC

## 2023-02-09 MED ORDER — CLONAZEPAM 0.5 MG PO TABS: 0.5000 mg | ORAL_TABLET | Freq: Every day | ORAL | 1 refills | Status: DC

## 2023-02-09 MED ORDER — CITALOPRAM HYDROBROMIDE 20 MG PO TABS: 20.0000 mg | ORAL_TABLET | Freq: Every evening | ORAL | 3 refills | Status: DC

## 2023-02-09 MED ORDER — ATORVASTATIN CALCIUM 10 MG PO TABS
ORAL_TABLET | ORAL | 3 refills | Status: DC
Start: 2023-02-09 — End: 2023-08-31

## 2023-02-09 NOTE — Progress Notes (Signed)
Subjective:  Patient ID: Tracey Ayala, female    DOB: [birth year]  Age: 72 y.o. MRN: [id number]  CC:  Chief Complaint  Patient presents with   Medical Management of Chronic Issues    Pt is doing well, notes had recent biopsy on upper lip for skin cancer stitch will be removed today     HPI Tracey Ayala presents for   Prediabetes: Mild elevated A1c prior - diet/exercise approach. Has cut back on sugar. Weight improved Lab Results  Component Value Date   HGBA1C 5.8 08/11/2022   Wt Readings from Last 3 Encounters:  02/09/23 103 lb 12.8 oz (47.1 kg)  11/23/22 105 lb 3.2 oz (47.7 kg)  08/11/22 109 lb 9.6 oz (49.7 kg)   Hypertension: Lisinopril 10 mg daily, no new side effects.  Home readings: 120-130/70-80.  BP Readings from Last 3 Encounters:  02/09/23 124/74  11/23/22 130/82  08/11/22 134/72   Lab Results  Component Value Date   CREATININE 0.73 08/11/2022   Hyperlipidemia: Lipitor 10 mg daily, fasting today.  Lab Results  Component Value Date   CHOL 196 08/11/2022   HDL 89.50 08/11/2022   LDLCALC 93 08/11/2022   TRIG 69.0 08/11/2022   CHOLHDL 2 08/11/2022   Lab Results  Component Value Date   ALT 22 08/11/2022   AST 28 08/11/2022   ALKPHOS 56 08/11/2022   BILITOT 0.6 08/11/2022   Osteopenia Calcium and vitamin D supplementation previously. Due for repeat bone density this year - [facility] appt on July 24 with mammogram.  Last vitamin D Lab Results  Component Value Date   VD25OH 76.29 02/03/2022   Anxiety with insomnia. Celexa 20mg  every day. clonazepam nightly, but has cut in half - still working ok at that dose. Unable to sleep off meds, tried to come off a few times. Controlled substance database (PDMP) reviewed. No concerns appreciated. Last filled 01/04/23. No falls, lightheadedness, need for increased meds or new side effects.      01/25/2023   11:12 AM 11/23/2022   11:57 AM 08/11/2022   10:11 AM 02/03/2022   11:17 AM 01/06/2022   11:41 AM   Depression screen PHQ 2/9  Decreased Interest 0 0 0 0 0  Down, Depressed, Hopeless 0 0 0 0 0  PHQ - 2 Score 0 0 0 0 0  Altered sleeping 0 0 0    Tired, decreased energy 0 1 1    Change in appetite 0 0 0    Feeling bad or failure about yourself  0 0 0    Trouble concentrating 0 0 0    Moving slowly or fidgety/restless 0 0 0    Suicidal thoughts 0 0 0    PHQ-9 Score 0 1 1    Difficult doing work/chores Not difficult at all          02/09/2023   11:07 AM 11/23/2022   11:57 AM  GAD 7 : Generalized Anxiety Score  Nervous, Anxious, on Edge 0 0  Control/stop worrying 0 0  Worry too much - different things 0 0  Trouble relaxing 0 0  Restless 0 0  Easily annoyed or irritable 0 0  Afraid - awful might happen 0 0  Total GAD 7 Score 0 0  Anxiety Difficulty  Not difficult at all   HM: Agrees to hep c screen.  Follow up planned 7/12 for liver cyst, urology for renal cysts.  Covid booster declined.  Shingrix in 2018. Pharmacy - [facility].  Recent MOHS on left face, removal of sutures planned today. [facility].      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 01/03/2023   Syncope and collapse 12/23/2021   Mitral valve prolapse 12/23/2021   Essential hypertension 12/23/2021   Endometriosis    Allergic rhinitis 12/02/2020   Allergic rhinitis due to animal (cat) (dog) hair and dander 12/02/2020   Chronic allergic conjunctivitis 12/02/2020   Past Medical History:  Diagnosis Date   Adnexal mass    Allergy    Receives injections once a week   Anxiety    Basal cell carcinoma 03/04/2013   bcc right nostril =mohs   Cataract    Endometriosis    Essential hypertension 12/23/2021   Fibroma of right ovary 12/15/2020   Hypercholesterolemia    Hypertension    Mitral valve prolapse 12/23/2021   Syncope and collapse 12/23/2021   Wears glasses    Past Surgical History:  Procedure  Laterality Date   basal cell carcinoma from left shoulder  2018   mohs procedure  2014   right nostril   ovarian cystectomy and appendectomy Left 1978   ROBOTIC ASSISTED SALPINGO OOPHERECTOMY Bilateral 12/07/2020   Procedure: XI ROBOTIC ASSISTED BILATERAL SALPINGO OOPHORECTOMY, MINI LAPAROTOMY, PELVIC WASHINGS;  Surgeon: Carver Fila, MD;  Location: [facility];  Service: Gynecology;  Laterality: Bilateral;   WISDOM TOOTH EXTRACTION  1974   Allergies  Allergen Reactions   Codeine Nausea Only, Nausea And Vomiting and Other (See Comments)    Sick   Other reaction(s): GI upset nausea  Sick   Other reaction(s): GI upset nausea  Sick   Other reaction(s): GI upset nausea  Sick   Other reaction(s): GI upset nausea   Fosamax [Alendronate] Rash   Prior to Admission medications   Medication Sig Start Date End Date Taking? Authorizing Provider  atorvastatin (LIPITOR) 10 MG tablet TAKE 1 TABLET BY MOUTH EVERY DAY 08/11/22  Yes Shade Flood, MD  Cholecalciferol (VITAMIN D) 50 MCG (2000 UT) CAPS Take 2,000 Units by mouth daily. Vitamin d 3   Yes [provider]  citalopram (CELEXA) 20 MG tablet Take 1 tablet (20 mg total) by mouth every evening. 08/11/22  Yes Shade Flood, MD  clonazePAM (KLONOPIN) 0.5 MG tablet TAKE 1 TABLET BY MOUTH EVERY DAY 11/05/22  Yes Shade Flood, MD  EPINEPHrine 0.3 mg/0.3 mL IJ SOAJ injection Inject 0.3 mg into the muscle as needed for anaphylaxis.   Yes [provider]  lisinopril (ZESTRIL) 10 MG tablet Take 1 tablet (10 mg total) by mouth daily. 08/11/22  Yes Shade Flood, MD  PRESCRIPTION MEDICATION Allergy Injections once a week.   Yes [provider]   Social History   Socioeconomic History   Marital status: Widowed    Spouse name: Not on file   Number of children: Not on file   Years of education: Not on file   Highest education level: Bachelor's degree (e.g., BA, AB, BS)  Occupational  History   Occupation: retired  Tobacco Use   Smoking status: Never   Smokeless tobacco: Never  Vaping Use   Vaping Use: Never used  Substance and Sexual Activity   Alcohol use: Never   Drug use: Never   Sexual activity: Not Currently  Other Topics Concern   Not on file  Social History Narrative   Not on file   Social Determinants of Health  Financial Resource Strain: Low Risk  (02/05/2023)   Overall Financial Resource Strain (CARDIA)    Difficulty of Paying Living Expenses: Not hard at all  Food Insecurity: No Food Insecurity (02/05/2023)   Hunger Vital Sign    Worried About Running Out of Food in the Last Year: Never true    Ran Out of Food in the Last Year: Never true  Transportation Needs: No Transportation Needs (02/05/2023)   PRAPARE - Administrator, Civil Service (Medical): No    Lack of Transportation (Non-Medical): No  Physical Activity: Sufficiently Active (02/05/2023)   Exercise Vital Sign    Days of Exercise per Week: 5 days    Minutes of Exercise per Session: 40 min  Stress: No Stress Concern Present (02/05/2023)   [employer] of Occupational Health - Occupational Stress Questionnaire    Feeling of Stress : Only a little  Social Connections: Unknown (02/05/2023)   Social Connection and Isolation Panel [NHANES]    Frequency of Communication with Friends and Family: More than three times a week    Frequency of Social Gatherings with Friends and Family: More than three times a week    Attends Religious Services: Patient declined    Database administrator or Organizations: No    Attends Engineer, structural: More than 4 times per year    Marital Status: Widowed  Intimate Partner Violence: Not At Risk (01/25/2023)   Humiliation, Afraid, Rape, and Kick questionnaire    Fear of Current or Ex-Partner: No    Emotionally Abused: No    Physically Abused: No    Sexually Abused: No    Review of Systems  Constitutional:  Negative for fatigue and  unexpected weight change.  Respiratory:  Negative for chest tightness and shortness of breath.   Cardiovascular:  Negative for chest pain, palpitations and leg swelling.  Gastrointestinal:  Negative for abdominal pain and blood in stool.  Neurological:  Negative for dizziness, syncope, light-headedness and headaches.     Objective:   Vitals:   02/09/23 1110  BP: 124/74  Pulse: 77  Temp: 97.8 F (36.6 C)  TempSrc: Temporal  SpO2: 96%  Weight: 103 lb 12.8 oz (47.1 kg)  Height: 5\' 2"  (1.575 m)     Physical Exam Vitals reviewed.  Constitutional:      Appearance: Normal appearance. She is well-developed.  HENT:     Head: Normocephalic and atraumatic.  Eyes:     Conjunctiva/sclera: Conjunctivae normal.     Pupils: Pupils are equal, round, and reactive to light.  Neck:     Vascular: No carotid bruit.  Cardiovascular:     Rate and Rhythm: Normal rate and regular rhythm.     Heart sounds: Normal heart sounds.  Pulmonary:     Effort: Pulmonary effort is normal.     Breath sounds: Normal breath sounds.  Abdominal:     Palpations: Abdomen is soft. There is no pulsatile mass.     Tenderness: There is no abdominal tenderness.  Musculoskeletal:     Right lower leg: No edema.     Left lower leg: No edema.  Skin:    General: Skin is warm and dry.  Neurological:     Mental Status: She is alert and oriented to person, place, and time.  Psychiatric:        Mood and Affect: Mood normal.        Behavior: Behavior normal.        Assessment & Plan:  CAELYNN LEFFLER is a 72 y.o. female . Prediabetes - Plan: Hemoglobin A1c  -Check A1c and adjustment of plan accordingly.  Continue watch diet, activity.  Anxiety - Plan: clonazePAM (KLONOPIN) 0.5 MG tablet, citalopram (CELEXA) 20 MG tablet Insomnia, unspecified type - Plan: clonazePAM (KLONOPIN) 0.5 MG tablet  -Anxiety, insomnia stable with current regimen, using lowest effective dose of Klonopin without any side effects.   Continue Celexa same dose.  Essential hypertension - Plan: lisinopril (ZESTRIL) 10 MG tablet  - Stable, tolerating current regimen. Medications refilled. Labs pending as above.   Hyperlipidemia, unspecified hyperlipidemia type - Plan: atorvastatin (LIPITOR) 10 MG tablet, Comprehensive metabolic panel, Lipid panel  - Stable, tolerating current regimen. Medications refilled. Labs pending as above.    Meds ordered this encounter  Medications   clonazePAM (KLONOPIN) 0.5 MG tablet    Sig: Take 1 tablet (0.5 mg total) by mouth daily.    Dispense:  30 tablet    Refill:  1    Not to exceed 2 additional fills before 01/29/2023 DX Code Needed  .   lisinopril (ZESTRIL) 10 MG tablet    Sig: Take 1 tablet (10 mg total) by mouth daily.    Dispense:  90 tablet    Refill:  3   atorvastatin (LIPITOR) 10 MG tablet    Sig: TAKE 1 TABLET BY MOUTH EVERY DAY    Dispense:  90 tablet    Refill:  3   citalopram (CELEXA) 20 MG tablet    Sig: Take 1 tablet (20 mg total) by mouth every evening.    Dispense:  90 tablet    Refill:  3   Patient Instructions  No change in medications today.  Will watch for results of the bone density test as well as any recommendations from the upcoming specialist visits.  I would pose the question of calcium supplementation with your urologist but typically with thinning of the bones I do recommend calcium and vitamin D supplementation.  Continue to use lowest effective dose of the clonazepam and let me know if you have any new side effects.  Take care.     Signed,   Meredith Staggers, MD [facility], [facility] 02/09/23 11:57 AM

## 2023-02-09 NOTE — Patient Instructions (Signed)
No change in medications today.  Will watch for results of the bone density test as well as any recommendations from the upcoming specialist visits.  I would pose the question of calcium supplementation with your urologist but typically with thinning of the bones I do recommend calcium and vitamin D supplementation.  Continue to use lowest effective dose of the clonazepam and let me know if you have any new side effects.  Take care.

## 2023-02-10 ENCOUNTER — Encounter: Payer: Self-pay | Admitting: Family Medicine

## 2023-02-13 ENCOUNTER — Ambulatory Visit: Payer: Medicare HMO | Admitting: Family Medicine

## 2023-02-13 DIAGNOSIS — J301 Allergic rhinitis due to pollen: Secondary | ICD-10-CM | POA: Diagnosis not present

## 2023-02-13 DIAGNOSIS — J3081 Allergic rhinitis due to animal (cat) (dog) hair and dander: Secondary | ICD-10-CM | POA: Diagnosis not present

## 2023-02-13 DIAGNOSIS — J3089 Other allergic rhinitis: Secondary | ICD-10-CM | POA: Diagnosis not present

## 2023-02-20 DIAGNOSIS — J3081 Allergic rhinitis due to animal (cat) (dog) hair and dander: Secondary | ICD-10-CM | POA: Diagnosis not present

## 2023-02-20 DIAGNOSIS — J301 Allergic rhinitis due to pollen: Secondary | ICD-10-CM | POA: Diagnosis not present

## 2023-02-20 DIAGNOSIS — J3089 Other allergic rhinitis: Secondary | ICD-10-CM | POA: Diagnosis not present

## 2023-02-24 DIAGNOSIS — D134 Benign neoplasm of liver: Secondary | ICD-10-CM | POA: Diagnosis not present

## 2023-02-27 DIAGNOSIS — J301 Allergic rhinitis due to pollen: Secondary | ICD-10-CM | POA: Diagnosis not present

## 2023-02-27 DIAGNOSIS — J3089 Other allergic rhinitis: Secondary | ICD-10-CM | POA: Diagnosis not present

## 2023-02-27 DIAGNOSIS — J3081 Allergic rhinitis due to animal (cat) (dog) hair and dander: Secondary | ICD-10-CM | POA: Diagnosis not present

## 2023-03-06 DIAGNOSIS — J3089 Other allergic rhinitis: Secondary | ICD-10-CM | POA: Diagnosis not present

## 2023-03-06 DIAGNOSIS — J301 Allergic rhinitis due to pollen: Secondary | ICD-10-CM | POA: Diagnosis not present

## 2023-03-06 DIAGNOSIS — J3081 Allergic rhinitis due to animal (cat) (dog) hair and dander: Secondary | ICD-10-CM | POA: Diagnosis not present

## 2023-03-08 DIAGNOSIS — Z1231 Encounter for screening mammogram for malignant neoplasm of breast: Secondary | ICD-10-CM | POA: Diagnosis not present

## 2023-03-08 DIAGNOSIS — J3081 Allergic rhinitis due to animal (cat) (dog) hair and dander: Secondary | ICD-10-CM | POA: Diagnosis not present

## 2023-03-08 DIAGNOSIS — J3089 Other allergic rhinitis: Secondary | ICD-10-CM | POA: Diagnosis not present

## 2023-03-08 DIAGNOSIS — J301 Allergic rhinitis due to pollen: Secondary | ICD-10-CM | POA: Diagnosis not present

## 2023-03-08 DIAGNOSIS — N958 Other specified menopausal and perimenopausal disorders: Secondary | ICD-10-CM | POA: Diagnosis not present

## 2023-03-08 DIAGNOSIS — M8588 Other specified disorders of bone density and structure, other site: Secondary | ICD-10-CM | POA: Diagnosis not present

## 2023-03-08 LAB — HM MAMMOGRAPHY

## 2023-03-08 LAB — HM DEXA SCAN

## 2023-03-10 ENCOUNTER — Encounter: Payer: Self-pay | Admitting: Family Medicine

## 2023-03-13 DIAGNOSIS — J3089 Other allergic rhinitis: Secondary | ICD-10-CM | POA: Diagnosis not present

## 2023-03-13 DIAGNOSIS — J3081 Allergic rhinitis due to animal (cat) (dog) hair and dander: Secondary | ICD-10-CM | POA: Diagnosis not present

## 2023-03-13 DIAGNOSIS — J301 Allergic rhinitis due to pollen: Secondary | ICD-10-CM | POA: Diagnosis not present

## 2023-03-15 DIAGNOSIS — D134 Benign neoplasm of liver: Secondary | ICD-10-CM | POA: Diagnosis not present

## 2023-03-17 DIAGNOSIS — J3089 Other allergic rhinitis: Secondary | ICD-10-CM | POA: Diagnosis not present

## 2023-03-17 DIAGNOSIS — J301 Allergic rhinitis due to pollen: Secondary | ICD-10-CM | POA: Diagnosis not present

## 2023-03-17 DIAGNOSIS — J3081 Allergic rhinitis due to animal (cat) (dog) hair and dander: Secondary | ICD-10-CM | POA: Diagnosis not present

## 2023-03-20 DIAGNOSIS — J301 Allergic rhinitis due to pollen: Secondary | ICD-10-CM | POA: Diagnosis not present

## 2023-03-20 DIAGNOSIS — J3089 Other allergic rhinitis: Secondary | ICD-10-CM | POA: Diagnosis not present

## 2023-03-20 DIAGNOSIS — J3081 Allergic rhinitis due to animal (cat) (dog) hair and dander: Secondary | ICD-10-CM | POA: Diagnosis not present

## 2023-03-21 DIAGNOSIS — D134 Benign neoplasm of liver: Secondary | ICD-10-CM | POA: Diagnosis not present

## 2023-03-21 DIAGNOSIS — K7689 Other specified diseases of liver: Secondary | ICD-10-CM | POA: Diagnosis not present

## 2023-03-24 DIAGNOSIS — R160 Hepatomegaly, not elsewhere classified: Secondary | ICD-10-CM | POA: Diagnosis not present

## 2023-03-24 DIAGNOSIS — K7689 Other specified diseases of liver: Secondary | ICD-10-CM | POA: Diagnosis not present

## 2023-03-24 DIAGNOSIS — R978 Other abnormal tumor markers: Secondary | ICD-10-CM | POA: Diagnosis not present

## 2023-03-24 DIAGNOSIS — J3081 Allergic rhinitis due to animal (cat) (dog) hair and dander: Secondary | ICD-10-CM | POA: Diagnosis not present

## 2023-03-24 DIAGNOSIS — J301 Allergic rhinitis due to pollen: Secondary | ICD-10-CM | POA: Diagnosis not present

## 2023-03-24 DIAGNOSIS — J3089 Other allergic rhinitis: Secondary | ICD-10-CM | POA: Diagnosis not present

## 2023-03-27 DIAGNOSIS — J301 Allergic rhinitis due to pollen: Secondary | ICD-10-CM | POA: Diagnosis not present

## 2023-03-27 DIAGNOSIS — J3081 Allergic rhinitis due to animal (cat) (dog) hair and dander: Secondary | ICD-10-CM | POA: Diagnosis not present

## 2023-03-27 DIAGNOSIS — J3089 Other allergic rhinitis: Secondary | ICD-10-CM | POA: Diagnosis not present

## 2023-04-03 DIAGNOSIS — J3089 Other allergic rhinitis: Secondary | ICD-10-CM | POA: Diagnosis not present

## 2023-04-03 DIAGNOSIS — J3081 Allergic rhinitis due to animal (cat) (dog) hair and dander: Secondary | ICD-10-CM | POA: Diagnosis not present

## 2023-04-03 DIAGNOSIS — J301 Allergic rhinitis due to pollen: Secondary | ICD-10-CM | POA: Diagnosis not present

## 2023-04-10 DIAGNOSIS — J3081 Allergic rhinitis due to animal (cat) (dog) hair and dander: Secondary | ICD-10-CM | POA: Diagnosis not present

## 2023-04-10 DIAGNOSIS — J3089 Other allergic rhinitis: Secondary | ICD-10-CM | POA: Diagnosis not present

## 2023-04-10 DIAGNOSIS — J301 Allergic rhinitis due to pollen: Secondary | ICD-10-CM | POA: Diagnosis not present

## 2023-04-11 DIAGNOSIS — R160 Hepatomegaly, not elsewhere classified: Secondary | ICD-10-CM | POA: Diagnosis not present

## 2023-04-18 DIAGNOSIS — J301 Allergic rhinitis due to pollen: Secondary | ICD-10-CM | POA: Diagnosis not present

## 2023-04-18 DIAGNOSIS — J3081 Allergic rhinitis due to animal (cat) (dog) hair and dander: Secondary | ICD-10-CM | POA: Diagnosis not present

## 2023-04-18 DIAGNOSIS — K7689 Other specified diseases of liver: Secondary | ICD-10-CM | POA: Diagnosis not present

## 2023-04-18 DIAGNOSIS — R160 Hepatomegaly, not elsewhere classified: Secondary | ICD-10-CM | POA: Diagnosis not present

## 2023-04-18 DIAGNOSIS — Z79899 Other long term (current) drug therapy: Secondary | ICD-10-CM | POA: Diagnosis not present

## 2023-04-18 DIAGNOSIS — J3089 Other allergic rhinitis: Secondary | ICD-10-CM | POA: Diagnosis not present

## 2023-04-24 DIAGNOSIS — J3081 Allergic rhinitis due to animal (cat) (dog) hair and dander: Secondary | ICD-10-CM | POA: Diagnosis not present

## 2023-04-24 DIAGNOSIS — J301 Allergic rhinitis due to pollen: Secondary | ICD-10-CM | POA: Diagnosis not present

## 2023-04-24 DIAGNOSIS — J3089 Other allergic rhinitis: Secondary | ICD-10-CM | POA: Diagnosis not present

## 2023-05-01 DIAGNOSIS — J301 Allergic rhinitis due to pollen: Secondary | ICD-10-CM | POA: Diagnosis not present

## 2023-05-01 DIAGNOSIS — J3081 Allergic rhinitis due to animal (cat) (dog) hair and dander: Secondary | ICD-10-CM | POA: Diagnosis not present

## 2023-05-01 DIAGNOSIS — J3089 Other allergic rhinitis: Secondary | ICD-10-CM | POA: Diagnosis not present

## 2023-05-08 DIAGNOSIS — J3081 Allergic rhinitis due to animal (cat) (dog) hair and dander: Secondary | ICD-10-CM | POA: Diagnosis not present

## 2023-05-08 DIAGNOSIS — J301 Allergic rhinitis due to pollen: Secondary | ICD-10-CM | POA: Diagnosis not present

## 2023-05-08 DIAGNOSIS — J3089 Other allergic rhinitis: Secondary | ICD-10-CM | POA: Diagnosis not present

## 2023-05-15 DIAGNOSIS — J301 Allergic rhinitis due to pollen: Secondary | ICD-10-CM | POA: Diagnosis not present

## 2023-05-15 DIAGNOSIS — J3089 Other allergic rhinitis: Secondary | ICD-10-CM | POA: Diagnosis not present

## 2023-05-15 DIAGNOSIS — J3081 Allergic rhinitis due to animal (cat) (dog) hair and dander: Secondary | ICD-10-CM | POA: Diagnosis not present

## 2023-05-22 DIAGNOSIS — J3081 Allergic rhinitis due to animal (cat) (dog) hair and dander: Secondary | ICD-10-CM | POA: Diagnosis not present

## 2023-05-22 DIAGNOSIS — J301 Allergic rhinitis due to pollen: Secondary | ICD-10-CM | POA: Diagnosis not present

## 2023-05-22 DIAGNOSIS — J3089 Other allergic rhinitis: Secondary | ICD-10-CM | POA: Diagnosis not present

## 2023-05-23 DIAGNOSIS — J3081 Allergic rhinitis due to animal (cat) (dog) hair and dander: Secondary | ICD-10-CM | POA: Diagnosis not present

## 2023-05-23 DIAGNOSIS — J3089 Other allergic rhinitis: Secondary | ICD-10-CM | POA: Diagnosis not present

## 2023-05-23 DIAGNOSIS — J301 Allergic rhinitis due to pollen: Secondary | ICD-10-CM | POA: Diagnosis not present

## 2023-05-29 DIAGNOSIS — J3081 Allergic rhinitis due to animal (cat) (dog) hair and dander: Secondary | ICD-10-CM | POA: Diagnosis not present

## 2023-05-29 DIAGNOSIS — J301 Allergic rhinitis due to pollen: Secondary | ICD-10-CM | POA: Diagnosis not present

## 2023-05-29 DIAGNOSIS — J3089 Other allergic rhinitis: Secondary | ICD-10-CM | POA: Diagnosis not present

## 2023-05-30 DIAGNOSIS — Z124 Encounter for screening for malignant neoplasm of cervix: Secondary | ICD-10-CM | POA: Diagnosis not present

## 2023-05-30 DIAGNOSIS — R319 Hematuria, unspecified: Secondary | ICD-10-CM | POA: Diagnosis not present

## 2023-05-30 DIAGNOSIS — Z681 Body mass index (BMI) 19 or less, adult: Secondary | ICD-10-CM | POA: Diagnosis not present

## 2023-05-30 DIAGNOSIS — Z01419 Encounter for gynecological examination (general) (routine) without abnormal findings: Secondary | ICD-10-CM | POA: Diagnosis not present

## 2023-05-30 DIAGNOSIS — R3915 Urgency of urination: Secondary | ICD-10-CM | POA: Diagnosis not present

## 2023-06-05 DIAGNOSIS — J3089 Other allergic rhinitis: Secondary | ICD-10-CM | POA: Diagnosis not present

## 2023-06-05 DIAGNOSIS — J301 Allergic rhinitis due to pollen: Secondary | ICD-10-CM | POA: Diagnosis not present

## 2023-06-05 DIAGNOSIS — J3081 Allergic rhinitis due to animal (cat) (dog) hair and dander: Secondary | ICD-10-CM | POA: Diagnosis not present

## 2023-06-12 DIAGNOSIS — J3089 Other allergic rhinitis: Secondary | ICD-10-CM | POA: Diagnosis not present

## 2023-06-12 DIAGNOSIS — J301 Allergic rhinitis due to pollen: Secondary | ICD-10-CM | POA: Diagnosis not present

## 2023-06-12 DIAGNOSIS — J3081 Allergic rhinitis due to animal (cat) (dog) hair and dander: Secondary | ICD-10-CM | POA: Diagnosis not present

## 2023-06-16 DIAGNOSIS — D251 Intramural leiomyoma of uterus: Secondary | ICD-10-CM | POA: Diagnosis not present

## 2023-06-16 DIAGNOSIS — D252 Subserosal leiomyoma of uterus: Secondary | ICD-10-CM | POA: Diagnosis not present

## 2023-06-19 DIAGNOSIS — J3081 Allergic rhinitis due to animal (cat) (dog) hair and dander: Secondary | ICD-10-CM | POA: Diagnosis not present

## 2023-06-19 DIAGNOSIS — J3089 Other allergic rhinitis: Secondary | ICD-10-CM | POA: Diagnosis not present

## 2023-06-19 DIAGNOSIS — J301 Allergic rhinitis due to pollen: Secondary | ICD-10-CM | POA: Diagnosis not present

## 2023-06-22 ENCOUNTER — Other Ambulatory Visit: Payer: Self-pay | Admitting: Family Medicine

## 2023-06-22 DIAGNOSIS — G4700 Insomnia, unspecified: Secondary | ICD-10-CM

## 2023-06-22 DIAGNOSIS — F419 Anxiety disorder, unspecified: Secondary | ICD-10-CM

## 2023-06-22 NOTE — Telephone Encounter (Signed)
Last refill 02/09/2023 Last office visit 11/23/2022

## 2023-06-22 NOTE — Telephone Encounter (Signed)
Controlled substance database reviewed.  Clonazepam No. 30 last filled on 04/21/2023, previously 02/09/2023, 01/04/2023.  Medication was discussed at her June 27 visit.  Refill ordered.

## 2023-06-26 DIAGNOSIS — J3081 Allergic rhinitis due to animal (cat) (dog) hair and dander: Secondary | ICD-10-CM | POA: Diagnosis not present

## 2023-06-26 DIAGNOSIS — J301 Allergic rhinitis due to pollen: Secondary | ICD-10-CM | POA: Diagnosis not present

## 2023-06-26 DIAGNOSIS — J3089 Other allergic rhinitis: Secondary | ICD-10-CM | POA: Diagnosis not present

## 2023-07-03 DIAGNOSIS — J3081 Allergic rhinitis due to animal (cat) (dog) hair and dander: Secondary | ICD-10-CM | POA: Diagnosis not present

## 2023-07-03 DIAGNOSIS — J301 Allergic rhinitis due to pollen: Secondary | ICD-10-CM | POA: Diagnosis not present

## 2023-07-03 DIAGNOSIS — J3089 Other allergic rhinitis: Secondary | ICD-10-CM | POA: Diagnosis not present

## 2023-07-10 DIAGNOSIS — J3081 Allergic rhinitis due to animal (cat) (dog) hair and dander: Secondary | ICD-10-CM | POA: Diagnosis not present

## 2023-07-10 DIAGNOSIS — J301 Allergic rhinitis due to pollen: Secondary | ICD-10-CM | POA: Diagnosis not present

## 2023-07-10 DIAGNOSIS — J3089 Other allergic rhinitis: Secondary | ICD-10-CM | POA: Diagnosis not present

## 2023-07-17 DIAGNOSIS — J301 Allergic rhinitis due to pollen: Secondary | ICD-10-CM | POA: Diagnosis not present

## 2023-07-17 DIAGNOSIS — J3081 Allergic rhinitis due to animal (cat) (dog) hair and dander: Secondary | ICD-10-CM | POA: Diagnosis not present

## 2023-07-17 DIAGNOSIS — J3089 Other allergic rhinitis: Secondary | ICD-10-CM | POA: Diagnosis not present

## 2023-07-24 DIAGNOSIS — J3089 Other allergic rhinitis: Secondary | ICD-10-CM | POA: Diagnosis not present

## 2023-07-24 DIAGNOSIS — J3081 Allergic rhinitis due to animal (cat) (dog) hair and dander: Secondary | ICD-10-CM | POA: Diagnosis not present

## 2023-07-24 DIAGNOSIS — J301 Allergic rhinitis due to pollen: Secondary | ICD-10-CM | POA: Diagnosis not present

## 2023-07-31 DIAGNOSIS — J301 Allergic rhinitis due to pollen: Secondary | ICD-10-CM | POA: Diagnosis not present

## 2023-07-31 DIAGNOSIS — J3089 Other allergic rhinitis: Secondary | ICD-10-CM | POA: Diagnosis not present

## 2023-07-31 DIAGNOSIS — J3081 Allergic rhinitis due to animal (cat) (dog) hair and dander: Secondary | ICD-10-CM | POA: Diagnosis not present

## 2023-08-07 DIAGNOSIS — J3081 Allergic rhinitis due to animal (cat) (dog) hair and dander: Secondary | ICD-10-CM | POA: Diagnosis not present

## 2023-08-07 DIAGNOSIS — J301 Allergic rhinitis due to pollen: Secondary | ICD-10-CM | POA: Diagnosis not present

## 2023-08-07 DIAGNOSIS — J3089 Other allergic rhinitis: Secondary | ICD-10-CM | POA: Diagnosis not present

## 2023-08-11 ENCOUNTER — Encounter: Payer: Medicare HMO | Admitting: Family Medicine

## 2023-08-14 DIAGNOSIS — J3089 Other allergic rhinitis: Secondary | ICD-10-CM | POA: Diagnosis not present

## 2023-08-14 DIAGNOSIS — J301 Allergic rhinitis due to pollen: Secondary | ICD-10-CM | POA: Diagnosis not present

## 2023-08-14 DIAGNOSIS — J3081 Allergic rhinitis due to animal (cat) (dog) hair and dander: Secondary | ICD-10-CM | POA: Diagnosis not present

## 2023-08-21 DIAGNOSIS — J3081 Allergic rhinitis due to animal (cat) (dog) hair and dander: Secondary | ICD-10-CM | POA: Diagnosis not present

## 2023-08-21 DIAGNOSIS — J3089 Other allergic rhinitis: Secondary | ICD-10-CM | POA: Diagnosis not present

## 2023-08-21 DIAGNOSIS — J301 Allergic rhinitis due to pollen: Secondary | ICD-10-CM | POA: Diagnosis not present

## 2023-08-24 ENCOUNTER — Encounter: Payer: Medicare HMO | Admitting: Family Medicine

## 2023-08-28 DIAGNOSIS — J301 Allergic rhinitis due to pollen: Secondary | ICD-10-CM | POA: Diagnosis not present

## 2023-08-28 DIAGNOSIS — J3081 Allergic rhinitis due to animal (cat) (dog) hair and dander: Secondary | ICD-10-CM | POA: Diagnosis not present

## 2023-08-28 DIAGNOSIS — J3089 Other allergic rhinitis: Secondary | ICD-10-CM | POA: Diagnosis not present

## 2023-08-31 ENCOUNTER — Encounter: Payer: Self-pay | Admitting: Family Medicine

## 2023-08-31 ENCOUNTER — Ambulatory Visit: Payer: Medicare HMO | Admitting: Family Medicine

## 2023-08-31 VITALS — BP 126/80 | HR 10 | Temp 97.9°F | Ht 62.25 in | Wt 108.4 lb

## 2023-08-31 DIAGNOSIS — F419 Anxiety disorder, unspecified: Secondary | ICD-10-CM

## 2023-08-31 DIAGNOSIS — R7303 Prediabetes: Secondary | ICD-10-CM | POA: Diagnosis not present

## 2023-08-31 DIAGNOSIS — Z0000 Encounter for general adult medical examination without abnormal findings: Secondary | ICD-10-CM

## 2023-08-31 DIAGNOSIS — G4700 Insomnia, unspecified: Secondary | ICD-10-CM

## 2023-08-31 DIAGNOSIS — I10 Essential (primary) hypertension: Secondary | ICD-10-CM | POA: Diagnosis not present

## 2023-08-31 DIAGNOSIS — E785 Hyperlipidemia, unspecified: Secondary | ICD-10-CM

## 2023-08-31 LAB — HEMOGLOBIN A1C: Hgb A1c MFr Bld: 5.8 % (ref 4.6–6.5)

## 2023-08-31 LAB — CBC WITH DIFFERENTIAL/PLATELET
Basophils Absolute: 0.1 10*3/uL (ref 0.0–0.1)
Basophils Relative: 0.9 % (ref 0.0–3.0)
Eosinophils Absolute: 0.1 10*3/uL (ref 0.0–0.7)
Eosinophils Relative: 1.9 % (ref 0.0–5.0)
HCT: 41.6 % (ref 36.0–46.0)
Hemoglobin: 13.9 g/dL (ref 12.0–15.0)
Lymphocytes Relative: 24.2 % (ref 12.0–46.0)
Lymphs Abs: 1.3 10*3/uL (ref 0.7–4.0)
MCHC: 33.5 g/dL (ref 30.0–36.0)
MCV: 89.4 fL (ref 78.0–100.0)
Monocytes Absolute: 0.5 10*3/uL (ref 0.1–1.0)
Monocytes Relative: 8.6 % (ref 3.0–12.0)
Neutro Abs: 3.6 10*3/uL (ref 1.4–7.7)
Neutrophils Relative %: 64.4 % (ref 43.0–77.0)
Platelets: 198 10*3/uL (ref 150.0–400.0)
RBC: 4.65 Mil/uL (ref 3.87–5.11)
RDW: 13.2 % (ref 11.5–15.5)
WBC: 5.5 10*3/uL (ref 4.0–10.5)

## 2023-08-31 LAB — COMPREHENSIVE METABOLIC PANEL
ALT: 18 U/L (ref 0–35)
AST: 29 U/L (ref 0–37)
Albumin: 4.6 g/dL (ref 3.5–5.2)
Alkaline Phosphatase: 61 U/L (ref 39–117)
BUN: 12 mg/dL (ref 6–23)
CO2: 33 meq/L — ABNORMAL HIGH (ref 19–32)
Calcium: 9.4 mg/dL (ref 8.4–10.5)
Chloride: 100 meq/L (ref 96–112)
Creatinine, Ser: 0.67 mg/dL (ref 0.40–1.20)
GFR: 87.55 mL/min (ref >60.00)
Glucose, Bld: 89 mg/dL (ref 70–99)
Potassium: 3.6 meq/L (ref 3.5–5.1)
Sodium: 140 meq/L (ref 135–145)
Total Bilirubin: 0.7 mg/dL (ref 0.2–1.2)
Total Protein: 6.8 g/dL (ref 6.0–8.3)

## 2023-08-31 LAB — LIPID PANEL
Cholesterol: 197 mg/dL (ref 0–200)
HDL: 87.1 mg/dL (ref >39.00)
LDL Cholesterol: 91 mg/dL (ref 0–99)
NonHDL: 109.59
Total CHOL/HDL Ratio: 2
Triglycerides: 94 mg/dL (ref 0.0–149.0)
VLDL: 18.8 mg/dL (ref 0.0–40.0)

## 2023-08-31 MED ORDER — CLONAZEPAM 0.5 MG PO TABS: 0.5000 mg | ORAL_TABLET | Freq: Every day | ORAL | 1 refills | Status: DC

## 2023-08-31 MED ORDER — ATORVASTATIN CALCIUM 10 MG PO TABS: ORAL_TABLET | ORAL | 3 refills | Status: DC

## 2023-08-31 MED ORDER — LISINOPRIL 10 MG PO TABS: 10.0000 mg | ORAL_TABLET | Freq: Every day | ORAL | 3 refills | Status: DC

## 2023-08-31 MED ORDER — CITALOPRAM HYDROBROMIDE 20 MG PO TABS: 20.0000 mg | ORAL_TABLET | Freq: Every evening | ORAL | 3 refills | Status: DC

## 2023-08-31 NOTE — Progress Notes (Signed)
Subjective:  Patient ID: Tracey Ayala, female    DOB: [birth year]  Age: 73 y.o. MRN: [id number]  CC:  Chief Complaint  Patient presents with   Annual Exam    Pt is doing well no concerns, pt is fasting, pt is being seen at [facility] for complex Cyst of the liver FYI about pt care     HPI Tracey Ayala presents for Annual Exam  PCP, me Allergist, Dr. Madie [facility]  -  Dr. Gwenlyn Perking at [facility]. Dr. 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  Appointment in September, 20-month follow-up for surveillance labs, 37-month follow-up for surveillance labs and MRCP. Will be following up with Dr Gwenlyn Perking - same plan. Elevated CA-19. Not sure of cause. Will discuss with her specialist if other testing needed.  Urology: Dr. McDiarmid GYN: Dr. Langston Masker Optometry: Dr. Burundi.   Prediabetes: Improved on labs last year.  Lab Results  Component Value Date   HGBA1C 5.6 02/09/2023   Wt Readings from Last 3 Encounters:  08/31/23 108 lb 6.4 oz (49.2 kg)  02/09/23 103 lb 12.8 oz (47.1 kg)  11/23/22 105 lb 3.2 oz (47.7 kg)   Hypertension: Lisinopril 10mg  every day. No new side effects Home readings: 120-130/70-80.  BP Readings from Last 3 Encounters:  08/31/23 126/80  02/09/23 124/74  11/23/22 130/82   Lab Results  Component Value Date   CREATININE 0.76 02/09/2023   Hyperlipidemia: Lipitor 10mg  every day. No new myalgias/arthralgias.  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  Followed by GYN. Prior Prolia, Actonel prior. Concern regarding dental loss in sister with prolia.  Prior calcium, vit D - less calcium - some concerns with calcium and what she read, plans to increase. Managed  by GYN.   Anxiety with insomnia.  Celexa 20mg  every day - for years. No new side effects.  Klonopin most nights - taking 1/2 pill. Did not tolerate coming off of it, multiple otc treatments - none worked including melatonin.  No falls, dizziness, parasomnias, or side effects with clonazepam.  Controlled substance database reviewed.  Klonopin 30 filled on 06/22/2023, previously 04/21/2023.     08/31/2023   10:18 AM 01/25/2023   11:12 AM 11/23/2022   11:57 AM 08/11/2022   10:11 AM 02/03/2022   11:17 AM  Depression screen PHQ 2/9  Decreased Interest 0 0 0 0 0  Down, Depressed, Hopeless 0 0 0 0 0  PHQ - 2 Score 0 0 0 0 0  Altered sleeping 0 0 0 0   Tired, decreased energy 0 0 1 1   Change in appetite 0 0 0 0   Feeling bad or failure about yourself  0 0 0 0   Trouble concentrating 0 0 0 0   Moving slowly or fidgety/restless 0 0 0 0   Suicidal thoughts 0 0 0 0   PHQ-9 Score 0 0 1 1   Difficult doing work/chores  Not difficult at all       Health Maintenance  Topic Date Due   COVID-19 Vaccine (1) Never done   Hepatitis C Screening  Never done   Zoster Vaccines- Shingrix (1  of 2) 08/11/1970   INFLUENZA VACCINE  11/14/2023 (Originally 03/16/2023)   Medicare Annual Wellness (AWV)  01/25/2024   MAMMOGRAM  03/07/2025   DTaP/Tdap/Td (4 - Td or Tdap) 08/03/2028   Colonoscopy  01/19/2030   Pneumonia Vaccine 2+ Years old  Completed   DEXA SCAN  Completed   HPV VACCINES  Aged Out  Colonoscopy 2021, repeat 5 yrs.  BMD at GYN - 7/24. Osteoporosis as above.  Mammogram 7/24.   Immunization History  Administered Date(s) Administered   Influenza Split 06/25/2009, 05/18/2010, 05/23/2012, 06/13/2014, 05/18/2016   Influenza, High Dose Seasonal PF 06/22/2017, 06/28/2018, 06/04/2019, 06/24/2019, 06/23/2020   Influenza,inj,Quad PF,6+ Mos 05/22/2013   Influenza,inj,quad, With Preservative 06/01/2015   Pneumococcal Conjugate-13 02/01/2017   Pneumococcal Polysaccharide-23 06/22/2017, 04/05/2018,  06/28/2018, 06/23/2020   Td 04/05/2018   Tdap 11/06/2008, 08/03/2018   Zoster, Live 01/02/2012, 04/28/2018, 08/26/2018   Zoster, Unspecified 06/28/2022  Flu vaccine - declines.  Covid booster - declines.   No results found. Dr. Burundi. Yearly visits.   Dental: every 6 months. Dr. Delane Ginger  Alcohol: none  Tobacco: none  Exercise: walking 2 miles per day. Gym when cold weather. at gym 5 days per week.    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 01/03/2023   Syncope and collapse 12/23/2021   Mitral valve prolapse 12/23/2021   Essential hypertension 12/23/2021   Endometriosis    Allergic rhinitis 12/02/2020   Allergic rhinitis due to animal (cat) (dog) hair and dander 12/02/2020   Chronic allergic conjunctivitis 12/02/2020   Past Medical History:  Diagnosis Date   Adnexal mass    Allergy    Receives injections once a week   Anxiety    Basal cell carcinoma 03/04/2013   bcc right nostril =mohs   Cataract    Endometriosis    Essential hypertension 12/23/2021   Fibroma of right ovary 12/15/2020   Hypercholesterolemia    Hypertension    Mitral valve prolapse 12/23/2021   Syncope and collapse 12/23/2021   Wears glasses    Past Surgical History:  Procedure Laterality Date   basal cell carcinoma from left shoulder  2018   mohs procedure  2014   right nostril   ovarian cystectomy and appendectomy Left 1978   ROBOTIC ASSISTED SALPINGO OOPHERECTOMY Bilateral 12/07/2020   Procedure: XI ROBOTIC ASSISTED BILATERAL SALPINGO OOPHORECTOMY, MINI LAPAROTOMY, PELVIC WASHINGS;  Surgeon: Carver Fila, MD;  Location: [facility];  Service: Gynecology;  Laterality: Bilateral;   WISDOM TOOTH EXTRACTION  1974   Allergies  Allergen Reactions   Codeine Nausea Only, Nausea And Vomiting and Other (See Comments)    Sick   Other reaction(s): GI upset nausea  Sick   Other reaction(s): GI upset  nausea  Sick   Other reaction(s): GI upset nausea  Sick   Other reaction(s): GI upset nausea   Fosamax [Alendronate] Rash   Prior to Admission medications   Medication Sig Start Date End Date Taking? Authorizing Provider  atorvastatin (LIPITOR) 10 MG tablet TAKE 1 TABLET BY MOUTH EVERY DAY 02/09/23  Yes Shade Flood, MD  Cholecalciferol (VITAMIN D) 50 MCG (2000 UT) CAPS Take 2,000 Units by mouth daily. Vitamin d 3   Yes [provider]  citalopram (CELEXA) 20 MG tablet Take 1 tablet (20 mg total) by mouth every evening. 02/09/23  Yes Shade Flood, MD  clonazePAM (KLONOPIN) 0.5 MG tablet TAKE 1 TABLET BY MOUTH EVERY DAY  06/22/23  Yes Shade Flood, MD  EPINEPHrine 0.3 mg/0.3 mL IJ SOAJ injection Inject 0.3 mg into the muscle as needed for anaphylaxis.   Yes [provider]  lisinopril (ZESTRIL) 10 MG tablet Take 1 tablet (10 mg total) by mouth daily. 02/09/23  Yes Shade Flood, MD  PRESCRIPTION MEDICATION Allergy Injections once a week.   Yes [provider]   Social History   Socioeconomic History   Marital status: Widowed    Spouse name: Not on file   Number of children: Not on file   Years of education: Not on file   Highest education level: Bachelor's degree (e.g., BA, AB, BS)  Occupational History   Occupation: retired  Tobacco Use   Smoking status: Never   Smokeless tobacco: Never  Vaping Use   Vaping status: Never Used  Substance and Sexual Activity   Alcohol use: Never   Drug use: Never   Sexual activity: Not Currently  Other Topics Concern   Not on file  Social History Narrative   Not on file   Social Drivers of Health   Financial Resource Strain: Low Risk  (08/27/2023)   Overall Financial Resource Strain (CARDIA)    Difficulty of Paying Living Expenses: Not hard at all  Food Insecurity: No Food Insecurity (08/27/2023)   Hunger Vital Sign    Worried About Running Out of Food in the Last Year: Never true    Ran Out of  Food in the Last Year: Never true  Transportation Needs: No Transportation Needs (08/27/2023)   PRAPARE - Administrator, Civil Service (Medical): No    Lack of Transportation (Non-Medical): No  Physical Activity: Sufficiently Active (08/27/2023)   Exercise Vital Sign    Days of Exercise per Week: 4 days    Minutes of Exercise per Session: 50 min  Stress: No Stress Concern Present (08/27/2023)   [employer] of Occupational Health - Occupational Stress Questionnaire    Feeling of Stress : Only a little  Social Connections: Moderately Integrated (08/27/2023)   Social Connection and Isolation Panel [NHANES]    Frequency of Communication with Friends and Family: More than three times a week    Frequency of Social Gatherings with Friends and Family: More than three times a week    Attends Religious Services: More than 4 times per year    Active Member of Clubs or Organizations: Patient declined    Attends Engineer, structural: More than 4 times per year    Marital Status: Widowed  Intimate Partner Violence: Not At Risk (01/25/2023)   Humiliation, Afraid, Rape, and Kick questionnaire    Fear of Current or Ex-Partner: No    Emotionally Abused: No    Physically Abused: No    Sexually Abused: No    Review of Systems 13 point review of systems per patient health survey noted.  Negative other than as indicated above or in HPI.    Objective:   Vitals:   08/31/23 1023  BP: 126/80  Pulse: (!) 10  Temp: 97.9 F (36.6 C)  TempSrc: Temporal  SpO2: 98%  Weight: 108 lb 6.4 oz (49.2 kg)  Height: 5' 2.25" (1.581 m)     Physical Exam Vitals reviewed.  Constitutional:      Appearance: She is well-developed.  HENT:     Head: Normocephalic and atraumatic.     Right Ear: External ear normal.     Left Ear: External ear normal.  Eyes:  Conjunctiva/sclera: Conjunctivae normal.     Pupils: Pupils are equal, round, and reactive to light.  Neck:     Thyroid: No  thyromegaly.  Cardiovascular:     Rate and Rhythm: Normal rate and regular rhythm.     Heart sounds: Normal heart sounds. No murmur heard. Pulmonary:     Effort: Pulmonary effort is normal. No respiratory distress.     Breath sounds: Normal breath sounds. No wheezing.  Abdominal:     General: Bowel sounds are normal.     Palpations: Abdomen is soft.     Tenderness: There is no abdominal tenderness.  Musculoskeletal:        General: No tenderness. Normal range of motion.     Cervical back: Normal range of motion and neck supple.  Lymphadenopathy:     Cervical: No cervical adenopathy.  Skin:    General: Skin is warm and dry.     Findings: No rash.  Neurological:     Mental Status: She is alert and oriented to person, place, and time.  Psychiatric:        Behavior: Behavior normal.        Thought Content: Thought content normal.        Assessment & Plan:  Tracey Ayala is a 73 y.o. female . Annual physical exam - Plan: Comprehensive metabolic panel, Hemoglobin A1c, Lipid panel, CBC with Differential/Platelet  - -anticipatory guidance as below in AVS, screening labs above. Health maintenance items as above in HPI discussed/recommended as applicable.   Essential hypertension - Plan: CBC with Differential/Platelet, lisinopril (ZESTRIL) 10 MG tablet  -Tolerating current med regimen, continue same, check labs as above.  Hyperlipidemia, unspecified hyperlipidemia type - Plan: Comprehensive metabolic panel, Lipid panel, atorvastatin (LIPITOR) 10 MG tablet  -Tolerating Lipitor, continue same dose and adjust plan accordingly based on lab results.  Prediabetes - Plan: Comprehensive metabolic panel, Hemoglobin A1c  -Check A1c, CMP, no med changes for now.  Anxiety - Plan: citalopram (CELEXA) 20 MG tablet, clonazePAM (KLONOPIN) 0.5 MG tablet  -Stable with current dose Celexa, low-dose clonazepam to sleep with potential side effects and risk discussed, she has decreased dose to 1/2  pill and tolerating currently.  Insufficient treatment with multiple other over-the-counter treatments.  No changes for now.  Insomnia, unspecified type - Plan: clonazePAM (KLONOPIN) 0.5 MG tablet   Meds ordered this encounter  Medications   atorvastatin (LIPITOR) 10 MG tablet    Sig: TAKE 1 TABLET BY MOUTH EVERY DAY    Dispense:  90 tablet    Refill:  3   citalopram (CELEXA) 20 MG tablet    Sig: Take 1 tablet (20 mg total) by mouth every evening.    Dispense:  90 tablet    Refill:  3   clonazePAM (KLONOPIN) 0.5 MG tablet    Sig: Take 1 tablet (0.5 mg total) by mouth daily.    Dispense:  30 tablet    Refill:  1    Not to exceed 4 additional fills before 08/08/2023   lisinopril (ZESTRIL) 10 MG tablet    Sig: Take 1 tablet (10 mg total) by mouth daily.    Dispense:  90 tablet    Refill:  3   Patient Instructions  Thank you for coming in today and I do appreciate you waiting to see me.  I will check some screening labs and let you know if any concerns.  No medication changes for now.  Keep follow-up with specialist as planned but please let  me know if there are any questions.  I will see you in 6 months, happy to see you sooner if needed.  Take care!  Preventive Care 16 Years and Older, Female Preventive care refers to lifestyle choices and visits with your health care provider that can promote health and wellness. Preventive care visits are also called wellness exams. What can I expect for my preventive care visit? Counseling Your health care provider may ask you questions about your: Medical history, including: Past medical problems. Family medical history. Pregnancy and menstrual history. History of falls. Current health, including: Memory and ability to understand (cognition). Emotional well-being. Home life and relationship well-being. Sexual activity and sexual health. Lifestyle, including: Alcohol, nicotine or tobacco, and drug use. Access to firearms. Diet,  exercise, and sleep habits. Work and work Astronomer. Sunscreen use. Safety issues such as seatbelt and bike helmet use. Physical exam Your health care provider will check your: Height and weight. These may be used to calculate your BMI (body mass index). BMI is a measurement that tells if you are at a healthy weight. Waist circumference. This measures the distance around your waistline. This measurement also tells if you are at a healthy weight and may help predict your risk of certain diseases, such as type 2 diabetes and high blood pressure. Heart rate and blood pressure. Body temperature. Skin for abnormal spots. What immunizations do I need?  Vaccines are usually given at various ages, according to a schedule. Your health care provider will recommend vaccines for you based on your age, medical history, and lifestyle or other factors, such as travel or where you work. What tests do I need? Screening Your health care provider may recommend screening tests for certain conditions. This may include: Lipid and cholesterol levels. Hepatitis C test. Hepatitis B test. HIV (human immunodeficiency virus) test. STI (sexually transmitted infection) testing, if you are at risk. Lung cancer screening. Colorectal cancer screening. Diabetes screening. This is done by checking your blood sugar (glucose) after you have not eaten for a while (fasting). Mammogram. Talk with your health care provider about how often you should have regular mammograms. BRCA-related cancer screening. This may be done if you have a family history of breast, ovarian, tubal, or peritoneal cancers. Bone density scan. This is done to screen for osteoporosis. Talk with your health care provider about your test results, treatment options, and if necessary, the need for more tests. Follow these instructions at home: Eating and drinking  Eat a diet that includes fresh fruits and vegetables, whole grains, lean protein, and  low-fat dairy products. Limit your intake of foods with high amounts of sugar, saturated fats, and salt. Take vitamin and mineral supplements as recommended by your health care provider. Do not drink alcohol if your health care provider tells you not to drink. If you drink alcohol: Limit how much you have to 0-1 drink a day. Know how much alcohol is in your drink. In the [place] one drink equals one 12 oz bottle of beer (355 mL), one 5 oz glass of wine (148 mL), or one 1 oz glass of hard liquor (44 mL). Lifestyle Brush your teeth every morning and night with fluoride toothpaste. Floss one time each day. Exercise for at least 30 minutes 5 or more days each week. Do not use any products that contain nicotine or tobacco. These products include cigarettes, chewing tobacco, and vaping devices, such as e-cigarettes. If you need help quitting, ask your health care provider. Do not use drugs.  If you are sexually active, practice safe sex. Use a condom or other form of protection in order to prevent STIs. Take aspirin only as told by your health care provider. Make sure that you understand how much to take and what form to take. Work with your health care provider to find out whether it is safe and beneficial for you to take aspirin daily. Ask your health care provider if you need to take a cholesterol-lowering medicine (statin). Find healthy ways to manage stress, such as: Meditation, yoga, or listening to music. Journaling. Talking to a trusted person. Spending time with friends and family. Minimize exposure to UV radiation to reduce your risk of skin cancer. Safety Always wear your seat belt while driving or riding in a vehicle. Do not drive: If you have been drinking alcohol. Do not ride with someone who has been drinking. When you are tired or distracted. While texting. If you have been using any mind-altering substances or drugs. Wear a helmet and other protective equipment during sports  activities. If you have firearms in your house, make sure you follow all gun safety procedures. What's next? Visit your health care provider once a year for an annual wellness visit. Ask your health care provider how often you should have your eyes and teeth checked. Stay up to date on all vaccines. This information is not intended to replace advice given to you by your health care provider. Make sure you discuss any questions you have with your health care provider. Document Revised: 01/27/2021 Document Reviewed: 01/27/2021 Elsevier Patient Education  2024 Elsevier Inc.     Signed,   Meredith Staggers, MD [facility] 08/31/23 11:13 AM

## 2023-08-31 NOTE — Patient Instructions (Signed)
Thank you for coming in today and I do appreciate you waiting to see me.  I will check some screening labs and let you know if any concerns.  No medication changes for now.  Keep follow-up with specialist as planned but please let me know if there are any questions.  I will see you in 6 months, happy to see you sooner if needed.  Take care!  Preventive Care 54 Years and Older, Female Preventive care refers to lifestyle choices and visits with your health care provider that can promote health and wellness. Preventive care visits are also called wellness exams. What can I expect for my preventive care visit? Counseling Your health care provider may ask you questions about your: Medical history, including: Past medical problems. Family medical history. Pregnancy and menstrual history. History of falls. Current health, including: Memory and ability to understand (cognition). Emotional well-being. Home life and relationship well-being. Sexual activity and sexual health. Lifestyle, including: Alcohol, nicotine or tobacco, and drug use. Access to firearms. Diet, exercise, and sleep habits. Work and work Astronomer. Sunscreen use. Safety issues such as seatbelt and bike helmet use. Physical exam Your health care provider will check your: Height and weight. These may be used to calculate your BMI (body mass index). BMI is a measurement that tells if you are at a healthy weight. Waist circumference. This measures the distance around your waistline. This measurement also tells if you are at a healthy weight and may help predict your risk of certain diseases, such as type 2 diabetes and high blood pressure. Heart rate and blood pressure. Body temperature. Skin for abnormal spots. What immunizations do I need?  Vaccines are usually given at various ages, according to a schedule. Your health care provider will recommend vaccines for you based on your age, medical history, and lifestyle or other  factors, such as travel or where you work. What tests do I need? Screening Your health care provider may recommend screening tests for certain conditions. This may include: Lipid and cholesterol levels. Hepatitis C test. Hepatitis B test. HIV (human immunodeficiency virus) test. STI (sexually transmitted infection) testing, if you are at risk. Lung cancer screening. Colorectal cancer screening. Diabetes screening. This is done by checking your blood sugar (glucose) after you have not eaten for a while (fasting). Mammogram. Talk with your health care provider about how often you should have regular mammograms. BRCA-related cancer screening. This may be done if you have a family history of breast, ovarian, tubal, or peritoneal cancers. Bone density scan. This is done to screen for osteoporosis. Talk with your health care provider about your test results, treatment options, and if necessary, the need for more tests. Follow these instructions at home: Eating and drinking  Eat a diet that includes fresh fruits and vegetables, whole grains, lean protein, and low-fat dairy products. Limit your intake of foods with high amounts of sugar, saturated fats, and salt. Take vitamin and mineral supplements as recommended by your health care provider. Do not drink alcohol if your health care provider tells you not to drink. If you drink alcohol: Limit how much you have to 0-1 drink a day. Know how much alcohol is in your drink. In the [place] one drink equals one 12 oz bottle of beer (355 mL), one 5 oz glass of wine (148 mL), or one 1 oz glass of hard liquor (44 mL). Lifestyle Brush your teeth every morning and night with fluoride toothpaste. Floss one time each day. Exercise for at least  30 minutes 5 or more days each week. Do not use any products that contain nicotine or tobacco. These products include cigarettes, chewing tobacco, and vaping devices, such as e-cigarettes. If you need help quitting, ask  your health care provider. Do not use drugs. If you are sexually active, practice safe sex. Use a condom or other form of protection in order to prevent STIs. Take aspirin only as told by your health care provider. Make sure that you understand how much to take and what form to take. Work with your health care provider to find out whether it is safe and beneficial for you to take aspirin daily. Ask your health care provider if you need to take a cholesterol-lowering medicine (statin). Find healthy ways to manage stress, such as: Meditation, yoga, or listening to music. Journaling. Talking to a trusted person. Spending time with friends and family. Minimize exposure to UV radiation to reduce your risk of skin cancer. Safety Always wear your seat belt while driving or riding in a vehicle. Do not drive: If you have been drinking alcohol. Do not ride with someone who has been drinking. When you are tired or distracted. While texting. If you have been using any mind-altering substances or drugs. Wear a helmet and other protective equipment during sports activities. If you have firearms in your house, make sure you follow all gun safety procedures. What's next? Visit your health care provider once a year for an annual wellness visit. Ask your health care provider how often you should have your eyes and teeth checked. Stay up to date on all vaccines. This information is not intended to replace advice given to you by your health care provider. Make sure you discuss any questions you have with your health care provider. Document Revised: 01/27/2021 Document Reviewed: 01/27/2021 Elsevier Patient Education  2024 [employer].

## 2023-09-04 DIAGNOSIS — J3089 Other allergic rhinitis: Secondary | ICD-10-CM | POA: Diagnosis not present

## 2023-09-04 DIAGNOSIS — J301 Allergic rhinitis due to pollen: Secondary | ICD-10-CM | POA: Diagnosis not present

## 2023-09-04 DIAGNOSIS — J3081 Allergic rhinitis due to animal (cat) (dog) hair and dander: Secondary | ICD-10-CM | POA: Diagnosis not present

## 2023-09-05 ENCOUNTER — Encounter: Payer: Self-pay | Admitting: Family Medicine

## 2023-09-06 NOTE — Telephone Encounter (Signed)
Patient has questions about labs, wants to know about why her CO2 levels would be off and when she was anemic as she doesn't recall being told this

## 2023-09-11 DIAGNOSIS — J301 Allergic rhinitis due to pollen: Secondary | ICD-10-CM | POA: Diagnosis not present

## 2023-09-11 DIAGNOSIS — J3081 Allergic rhinitis due to animal (cat) (dog) hair and dander: Secondary | ICD-10-CM | POA: Diagnosis not present

## 2023-09-11 DIAGNOSIS — J3089 Other allergic rhinitis: Secondary | ICD-10-CM | POA: Diagnosis not present

## 2023-09-18 ENCOUNTER — Encounter: Payer: Medicare HMO | Admitting: Family Medicine

## 2023-09-18 DIAGNOSIS — J301 Allergic rhinitis due to pollen: Secondary | ICD-10-CM | POA: Diagnosis not present

## 2023-09-18 DIAGNOSIS — J3081 Allergic rhinitis due to animal (cat) (dog) hair and dander: Secondary | ICD-10-CM | POA: Diagnosis not present

## 2023-09-18 DIAGNOSIS — J3089 Other allergic rhinitis: Secondary | ICD-10-CM | POA: Diagnosis not present

## 2023-09-25 DIAGNOSIS — J3089 Other allergic rhinitis: Secondary | ICD-10-CM | POA: Diagnosis not present

## 2023-09-25 DIAGNOSIS — J3081 Allergic rhinitis due to animal (cat) (dog) hair and dander: Secondary | ICD-10-CM | POA: Diagnosis not present

## 2023-09-25 DIAGNOSIS — J301 Allergic rhinitis due to pollen: Secondary | ICD-10-CM | POA: Diagnosis not present

## 2023-09-29 DIAGNOSIS — R160 Hepatomegaly, not elsewhere classified: Secondary | ICD-10-CM | POA: Diagnosis not present

## 2023-09-29 DIAGNOSIS — K7689 Other specified diseases of liver: Secondary | ICD-10-CM | POA: Diagnosis not present

## 2023-09-29 DIAGNOSIS — R978 Other abnormal tumor markers: Secondary | ICD-10-CM | POA: Diagnosis not present

## 2023-10-02 DIAGNOSIS — J301 Allergic rhinitis due to pollen: Secondary | ICD-10-CM | POA: Diagnosis not present

## 2023-10-02 DIAGNOSIS — J3089 Other allergic rhinitis: Secondary | ICD-10-CM | POA: Diagnosis not present

## 2023-10-02 DIAGNOSIS — J3081 Allergic rhinitis due to animal (cat) (dog) hair and dander: Secondary | ICD-10-CM | POA: Diagnosis not present

## 2023-10-09 DIAGNOSIS — L821 Other seborrheic keratosis: Secondary | ICD-10-CM | POA: Diagnosis not present

## 2023-10-09 DIAGNOSIS — L820 Inflamed seborrheic keratosis: Secondary | ICD-10-CM | POA: Diagnosis not present

## 2023-10-09 DIAGNOSIS — L570 Actinic keratosis: Secondary | ICD-10-CM | POA: Diagnosis not present

## 2023-10-09 DIAGNOSIS — Z85828 Personal history of other malignant neoplasm of skin: Secondary | ICD-10-CM | POA: Diagnosis not present

## 2023-10-09 DIAGNOSIS — J301 Allergic rhinitis due to pollen: Secondary | ICD-10-CM | POA: Diagnosis not present

## 2023-10-09 DIAGNOSIS — J3089 Other allergic rhinitis: Secondary | ICD-10-CM | POA: Diagnosis not present

## 2023-10-09 DIAGNOSIS — J3081 Allergic rhinitis due to animal (cat) (dog) hair and dander: Secondary | ICD-10-CM | POA: Diagnosis not present

## 2023-10-16 DIAGNOSIS — J3089 Other allergic rhinitis: Secondary | ICD-10-CM | POA: Diagnosis not present

## 2023-10-16 DIAGNOSIS — J301 Allergic rhinitis due to pollen: Secondary | ICD-10-CM | POA: Diagnosis not present

## 2023-10-16 DIAGNOSIS — J3081 Allergic rhinitis due to animal (cat) (dog) hair and dander: Secondary | ICD-10-CM | POA: Diagnosis not present

## 2023-10-23 DIAGNOSIS — J3089 Other allergic rhinitis: Secondary | ICD-10-CM | POA: Diagnosis not present

## 2023-10-23 DIAGNOSIS — J3081 Allergic rhinitis due to animal (cat) (dog) hair and dander: Secondary | ICD-10-CM | POA: Diagnosis not present

## 2023-10-23 DIAGNOSIS — J301 Allergic rhinitis due to pollen: Secondary | ICD-10-CM | POA: Diagnosis not present

## 2023-10-24 DIAGNOSIS — H40013 Open angle with borderline findings, low risk, bilateral: Secondary | ICD-10-CM | POA: Diagnosis not present

## 2023-10-25 DIAGNOSIS — J301 Allergic rhinitis due to pollen: Secondary | ICD-10-CM | POA: Diagnosis not present

## 2023-10-25 DIAGNOSIS — J3081 Allergic rhinitis due to animal (cat) (dog) hair and dander: Secondary | ICD-10-CM | POA: Diagnosis not present

## 2023-10-25 DIAGNOSIS — J3089 Other allergic rhinitis: Secondary | ICD-10-CM | POA: Diagnosis not present

## 2023-10-28 IMAGING — DX DG CHEST 2V
2 series · 2 of 2 positions shown · non-contrast
Comparison: None

CLINICAL DATA: Near syncope.  Aspiration suspected.

EXAM:
CHEST - 2 VIEW

[chest pa]
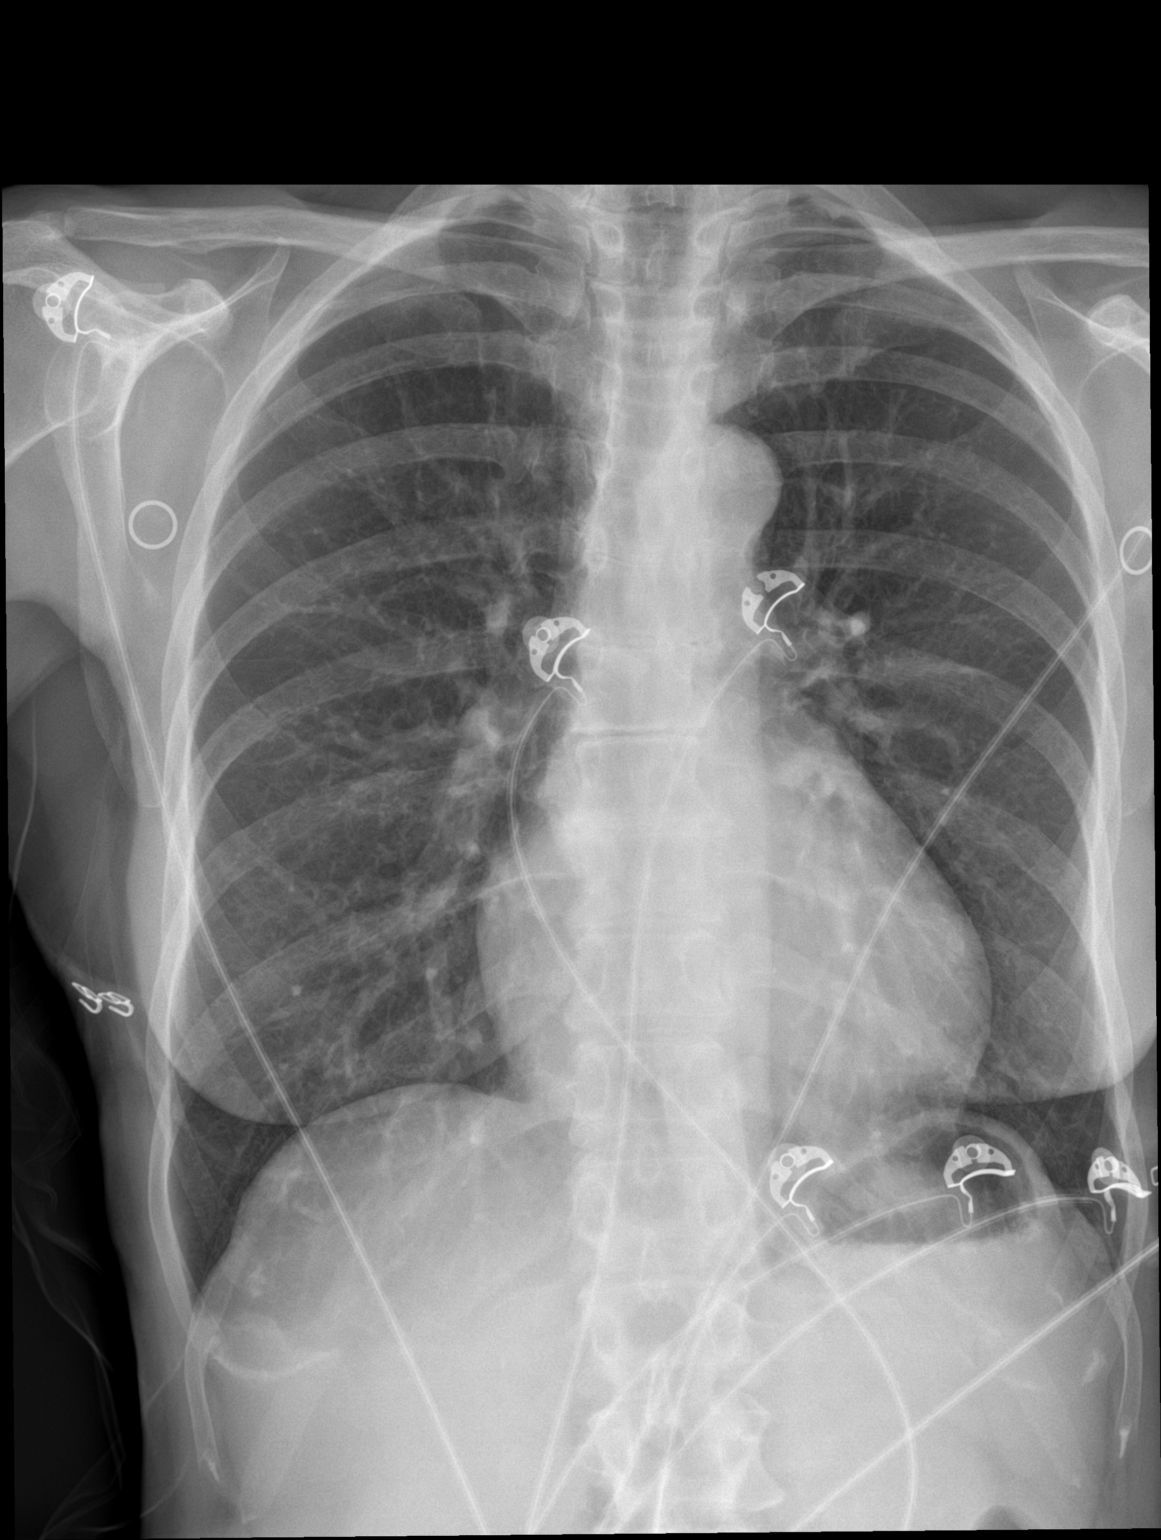

[chest lat]
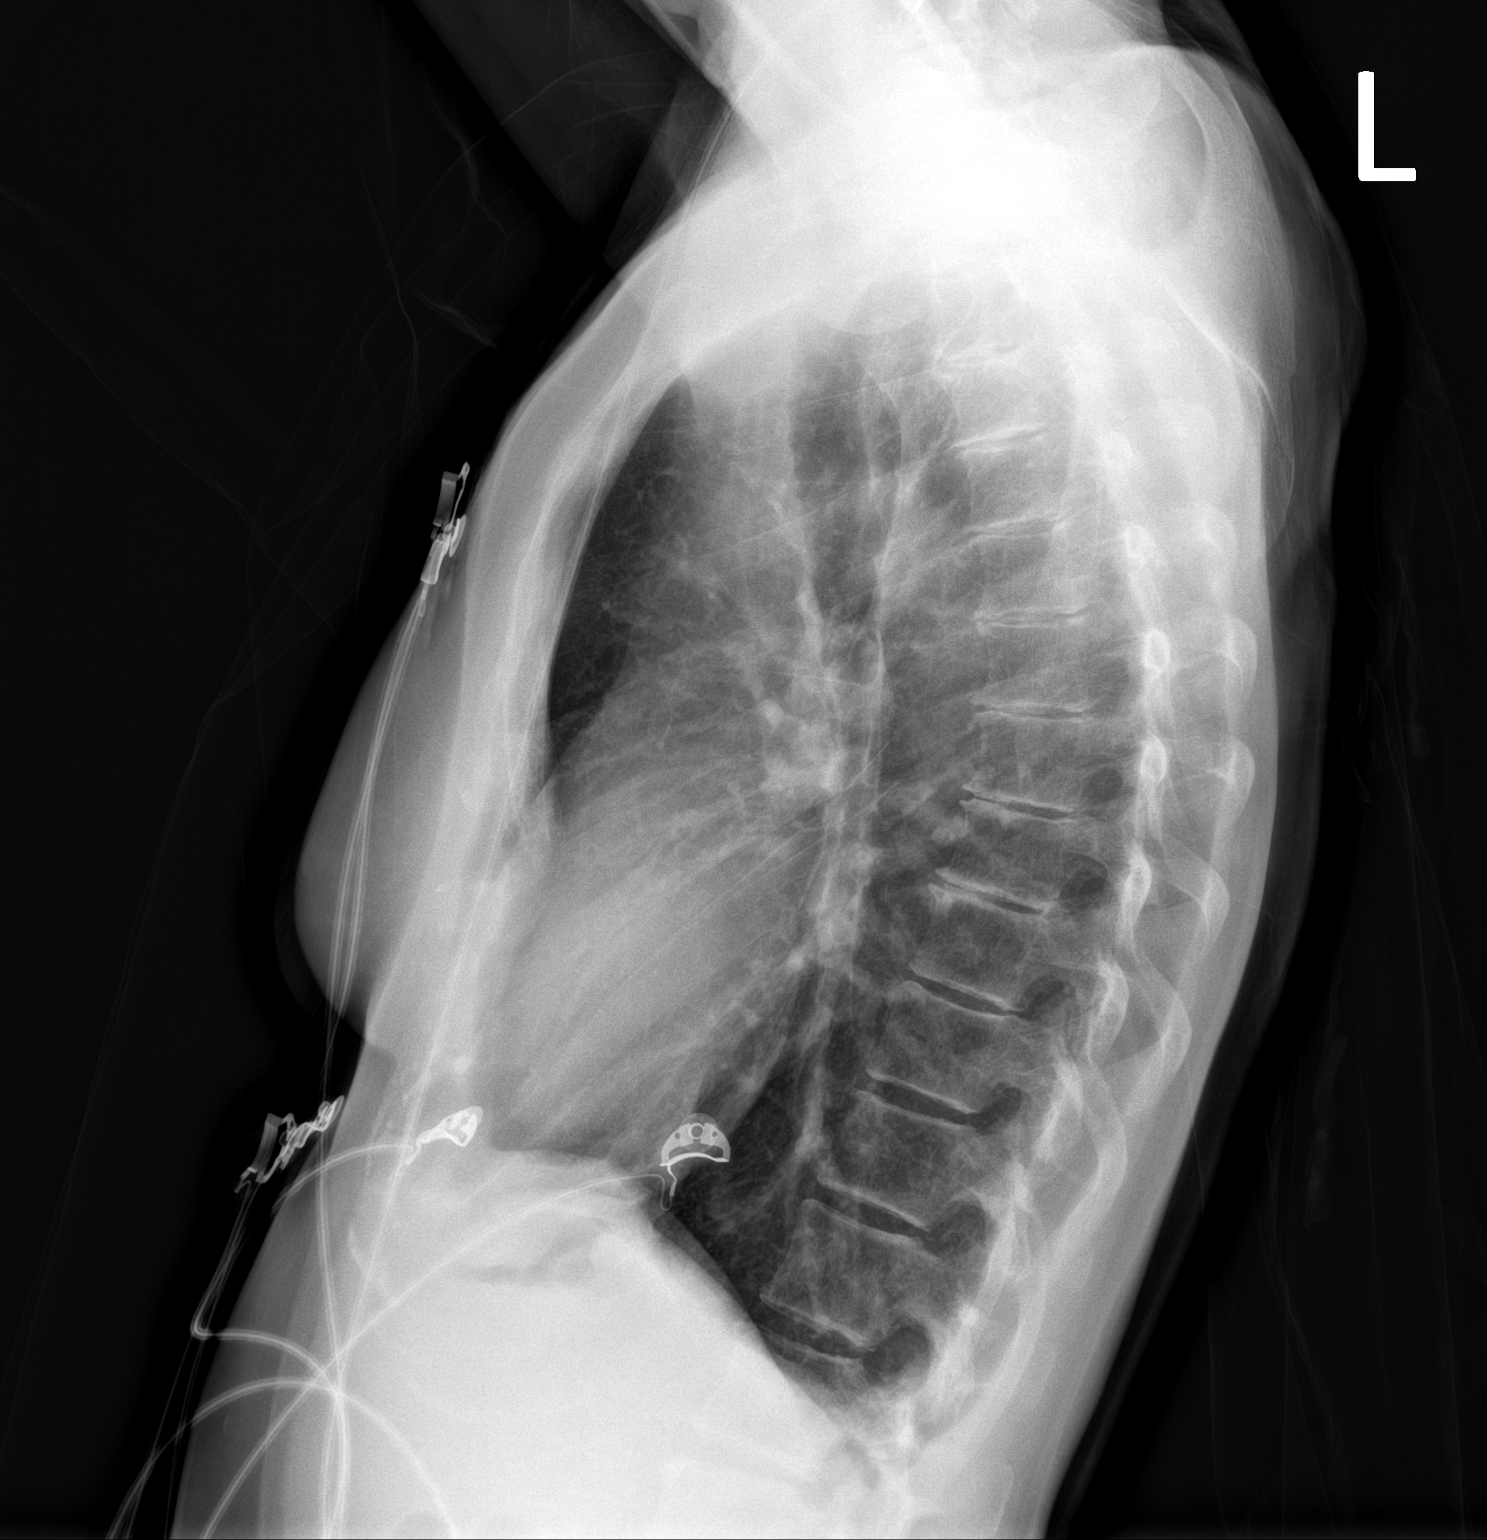

[2 of 2 positions shown; findings below may reference images not displayed]

FINDINGS: Heart size upper limits of normal. Mediastinal shadows are normal.
The lungs are clear. No sign of infiltrate or aspiration. No
effusion. No abnormal bone finding.
IMPRESSION: No active cardiopulmonary disease.

## 2023-10-30 DIAGNOSIS — J3081 Allergic rhinitis due to animal (cat) (dog) hair and dander: Secondary | ICD-10-CM | POA: Diagnosis not present

## 2023-10-30 DIAGNOSIS — J301 Allergic rhinitis due to pollen: Secondary | ICD-10-CM | POA: Diagnosis not present

## 2023-10-30 DIAGNOSIS — J3089 Other allergic rhinitis: Secondary | ICD-10-CM | POA: Diagnosis not present

## 2023-11-06 DIAGNOSIS — J3081 Allergic rhinitis due to animal (cat) (dog) hair and dander: Secondary | ICD-10-CM | POA: Diagnosis not present

## 2023-11-06 DIAGNOSIS — J301 Allergic rhinitis due to pollen: Secondary | ICD-10-CM | POA: Diagnosis not present

## 2023-11-06 DIAGNOSIS — J3089 Other allergic rhinitis: Secondary | ICD-10-CM | POA: Diagnosis not present

## 2023-11-13 DIAGNOSIS — J3081 Allergic rhinitis due to animal (cat) (dog) hair and dander: Secondary | ICD-10-CM | POA: Diagnosis not present

## 2023-11-13 DIAGNOSIS — J3089 Other allergic rhinitis: Secondary | ICD-10-CM | POA: Diagnosis not present

## 2023-11-13 DIAGNOSIS — J301 Allergic rhinitis due to pollen: Secondary | ICD-10-CM | POA: Diagnosis not present

## 2023-11-20 DIAGNOSIS — J301 Allergic rhinitis due to pollen: Secondary | ICD-10-CM | POA: Diagnosis not present

## 2023-11-20 DIAGNOSIS — J3089 Other allergic rhinitis: Secondary | ICD-10-CM | POA: Diagnosis not present

## 2023-11-20 DIAGNOSIS — J3081 Allergic rhinitis due to animal (cat) (dog) hair and dander: Secondary | ICD-10-CM | POA: Diagnosis not present

## 2023-11-27 DIAGNOSIS — J3089 Other allergic rhinitis: Secondary | ICD-10-CM | POA: Diagnosis not present

## 2023-11-27 DIAGNOSIS — J3081 Allergic rhinitis due to animal (cat) (dog) hair and dander: Secondary | ICD-10-CM | POA: Diagnosis not present

## 2023-11-27 DIAGNOSIS — J301 Allergic rhinitis due to pollen: Secondary | ICD-10-CM | POA: Diagnosis not present

## 2023-12-04 DIAGNOSIS — J3089 Other allergic rhinitis: Secondary | ICD-10-CM | POA: Diagnosis not present

## 2023-12-04 DIAGNOSIS — J3081 Allergic rhinitis due to animal (cat) (dog) hair and dander: Secondary | ICD-10-CM | POA: Diagnosis not present

## 2023-12-04 DIAGNOSIS — J301 Allergic rhinitis due to pollen: Secondary | ICD-10-CM | POA: Diagnosis not present

## 2023-12-11 DIAGNOSIS — J3081 Allergic rhinitis due to animal (cat) (dog) hair and dander: Secondary | ICD-10-CM | POA: Diagnosis not present

## 2023-12-11 DIAGNOSIS — J3089 Other allergic rhinitis: Secondary | ICD-10-CM | POA: Diagnosis not present

## 2023-12-11 DIAGNOSIS — J301 Allergic rhinitis due to pollen: Secondary | ICD-10-CM | POA: Diagnosis not present

## 2023-12-18 DIAGNOSIS — J3089 Other allergic rhinitis: Secondary | ICD-10-CM | POA: Diagnosis not present

## 2023-12-18 DIAGNOSIS — J301 Allergic rhinitis due to pollen: Secondary | ICD-10-CM | POA: Diagnosis not present

## 2023-12-18 DIAGNOSIS — J3081 Allergic rhinitis due to animal (cat) (dog) hair and dander: Secondary | ICD-10-CM | POA: Diagnosis not present

## 2023-12-25 DIAGNOSIS — J301 Allergic rhinitis due to pollen: Secondary | ICD-10-CM | POA: Diagnosis not present

## 2023-12-25 DIAGNOSIS — J3089 Other allergic rhinitis: Secondary | ICD-10-CM | POA: Diagnosis not present

## 2023-12-25 DIAGNOSIS — J3081 Allergic rhinitis due to animal (cat) (dog) hair and dander: Secondary | ICD-10-CM | POA: Diagnosis not present

## 2023-12-28 DIAGNOSIS — D2261 Melanocytic nevi of right upper limb, including shoulder: Secondary | ICD-10-CM | POA: Diagnosis not present

## 2023-12-28 DIAGNOSIS — D2271 Melanocytic nevi of right lower limb, including hip: Secondary | ICD-10-CM | POA: Diagnosis not present

## 2023-12-28 DIAGNOSIS — L814 Other melanin hyperpigmentation: Secondary | ICD-10-CM | POA: Diagnosis not present

## 2023-12-28 DIAGNOSIS — L218 Other seborrheic dermatitis: Secondary | ICD-10-CM | POA: Diagnosis not present

## 2023-12-28 DIAGNOSIS — D2272 Melanocytic nevi of left lower limb, including hip: Secondary | ICD-10-CM | POA: Diagnosis not present

## 2023-12-28 DIAGNOSIS — Z85828 Personal history of other malignant neoplasm of skin: Secondary | ICD-10-CM | POA: Diagnosis not present

## 2023-12-28 DIAGNOSIS — D1801 Hemangioma of skin and subcutaneous tissue: Secondary | ICD-10-CM | POA: Diagnosis not present

## 2023-12-28 DIAGNOSIS — L821 Other seborrheic keratosis: Secondary | ICD-10-CM | POA: Diagnosis not present

## 2023-12-28 DIAGNOSIS — D2262 Melanocytic nevi of left upper limb, including shoulder: Secondary | ICD-10-CM | POA: Diagnosis not present

## 2023-12-28 DIAGNOSIS — D225 Melanocytic nevi of trunk: Secondary | ICD-10-CM | POA: Diagnosis not present

## 2024-01-01 DIAGNOSIS — J3089 Other allergic rhinitis: Secondary | ICD-10-CM | POA: Diagnosis not present

## 2024-01-01 DIAGNOSIS — J301 Allergic rhinitis due to pollen: Secondary | ICD-10-CM | POA: Diagnosis not present

## 2024-01-01 DIAGNOSIS — J3081 Allergic rhinitis due to animal (cat) (dog) hair and dander: Secondary | ICD-10-CM | POA: Diagnosis not present

## 2024-01-02 DIAGNOSIS — H6123 Impacted cerumen, bilateral: Secondary | ICD-10-CM | POA: Diagnosis not present

## 2024-01-02 DIAGNOSIS — J3089 Other allergic rhinitis: Secondary | ICD-10-CM | POA: Diagnosis not present

## 2024-01-02 DIAGNOSIS — H1045 Other chronic allergic conjunctivitis: Secondary | ICD-10-CM | POA: Diagnosis not present

## 2024-01-02 DIAGNOSIS — J301 Allergic rhinitis due to pollen: Secondary | ICD-10-CM | POA: Diagnosis not present

## 2024-01-04 ENCOUNTER — Ambulatory Visit (INDEPENDENT_AMBULATORY_CARE_PROVIDER_SITE_OTHER): Admitting: Family Medicine

## 2024-01-04 ENCOUNTER — Encounter: Payer: Self-pay | Admitting: Family Medicine

## 2024-01-04 VITALS — BP 124/80 | HR 68 | Temp 98.2°F | Ht 62.25 in | Wt 109.0 lb

## 2024-01-04 DIAGNOSIS — J301 Allergic rhinitis due to pollen: Secondary | ICD-10-CM | POA: Insufficient documentation

## 2024-01-04 DIAGNOSIS — H6123 Impacted cerumen, bilateral: Secondary | ICD-10-CM

## 2024-01-04 NOTE — Progress Notes (Signed)
 Subjective:  Patient ID: Tracey Ayala, female    DOB: July 26, 1951  Age: 73 y.o. MRN: [id number]  CC:  Chief Complaint  Patient presents with   Referral    Referral to ENT due Issue with hearing, been going on for about two weeks. Itchiness in the right ear. There are wax in both ear.    HPI [name] presents for   Hearing difficulty, right ear itching Both ears feel blocked. Feels like wax buildup - past few weeks. No home treatment - unsuccessful in past. Muffled hearing bilaterally.  No pain, some itching on R.  Rarely uses Qtips - tries ot avoid.  No ear d/c.  No current ENT, but requests ENT eval.      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 01/03/2023   Impacted cerumen of right ear 11/09/2022   Syncope and collapse 12/23/2021   Mitral valve prolapse 12/23/2021   Essential hypertension 12/23/2021   Endometriosis    Allergic rhinitis 12/02/2020   Allergic rhinitis due to animal (cat) (dog) hair and dander 12/02/2020   Chronic allergic conjunctivitis 12/02/2020   Past Medical History:  Diagnosis Date   Adnexal mass    Allergy    Receives injections once a week   Anxiety    Basal cell carcinoma 03/04/2013   bcc right nostril =mohs   Cataract    Endometriosis    Essential hypertension 12/23/2021   Fibroma of right ovary 12/15/2020   Hypercholesterolemia    Hypertension    Mitral valve prolapse 12/23/2021   Syncope and collapse 12/23/2021   Wears glasses    Past Surgical History:  Procedure Laterality Date   basal cell carcinoma from left shoulder  2018   mohs procedure  2014   right nostril   ovarian cystectomy and appendectomy Left 1978   ROBOTIC ASSISTED SALPINGO OOPHERECTOMY Bilateral 12/07/2020   Procedure: XI ROBOTIC ASSISTED BILATERAL SALPINGO OOPHORECTOMY, MINI LAPAROTOMY, PELVIC  WASHINGS;  Surgeon: Suzi Essex, MD;  Location: [facility] [place];  Service: Gynecology;  Laterality: Bilateral;   WISDOM TOOTH EXTRACTION  1974   Allergies  Allergen Reactions   Codeine Nausea Only, Nausea And Vomiting and Other (See Comments)    Sick   Other reaction(s): GI upset nausea  Sick   Other reaction(s): GI upset nausea  Sick   Other reaction(s): GI upset nausea  Sick   Other reaction(s): GI upset nausea   Fosamax [Alendronate] Rash   Prior to Admission medications   Medication Sig Start Date End Date Taking? Authorizing Provider  atorvastatin  (LIPITOR) 10 MG tablet TAKE 1 TABLET BY MOUTH EVERY DAY 08/31/23  Yes Benjiman Bras, MD  Cholecalciferol (VITAMIN D ) 50 MCG (2000 UT) CAPS Take 2,000 Units by mouth daily. Vitamin d  3   Yes [provider]  citalopram  (CELEXA ) 20 MG tablet Take 1 tablet (20 mg total) by mouth every evening. 08/31/23  Yes Benjiman Bras, MD  clonazePAM  (KLONOPIN ) 0.5 MG tablet Take 1 tablet (0.5 mg total) by mouth daily. 08/31/23  Yes Benjiman Bras, MD  EPINEPHrine 0.3 mg/0.3 mL IJ SOAJ injection Inject 0.3 mg into the muscle as needed for anaphylaxis.   Yes [provider]  lisinopril  (ZESTRIL ) 10 MG tablet Take 1 tablet (10 mg total) by mouth daily. 08/31/23  Yes Benjiman Bras, MD  PRESCRIPTION MEDICATION Allergy Injections once a week.   Yes [provider]   Social History   Socioeconomic History   Marital status: Widowed    Spouse name: Not on file   Number of children: Not on file   Years of education: Not on file   Highest education level: Bachelor's degree (e.g., BA, AB, BS)  Occupational History   Occupation: retired  Tobacco Use   Smoking status: Never   Smokeless tobacco: Never  Vaping Use   Vaping status: Never Used  Substance and Sexual Activity   Alcohol use: Never   Drug use: Never   Sexual activity: Not Currently  Other Topics Concern   Not on file  Social History  Narrative   Not on file   Social Drivers of Health   Financial Resource Strain: Low Risk  (08/27/2023)   Overall Financial Resource Strain (CARDIA)    Difficulty of Paying Living Expenses: Not hard at all  Food Insecurity: No Food Insecurity (08/27/2023)   Hunger Vital Sign    Worried About Running Out of Food in the Last Year: Never true    Ran Out of Food in the Last Year: Never true  Transportation Needs: No Transportation Needs (08/27/2023)   PRAPARE - Administrator, Civil Service (Medical): No    Lack of Transportation (Non-Medical): No  Physical Activity: Sufficiently Active (08/27/2023)   Exercise Vital Sign    Days of Exercise per Week: 4 days    Minutes of Exercise per Session: 50 min  Stress: No Stress Concern Present (08/27/2023)   [employer] of Occupational Health - Occupational Stress Questionnaire    Feeling of Stress : Only a little  Social Connections: Moderately Integrated (08/27/2023)   Social Connection and Isolation Panel [NHANES]    Frequency of Communication with Friends and Family: More than three times a week    Frequency of Social Gatherings with Friends and Family: More than three times a week    Attends Religious Services: More than 4 times per year    Active Member of Clubs or Organizations: Patient declined    Attends Engineer, structural: More than 4 times per year    Marital Status: Widowed  Intimate Partner Violence: Not At Risk (01/25/2023)   Humiliation, Afraid, Rape, and Kick questionnaire    Fear of Current or Ex-Partner: No    Emotionally Abused: No    Physically Abused: No    Sexually Abused: No    Review of Systems   Objective:   Vitals:   01/04/24 0842  BP: 124/80  Pulse: 68  Temp: 98.2 F (36.8 C)  TempSrc: Temporal  SpO2: 97%  Weight: 109 lb (49.4 kg)  Height: 5' 2.25" (1.581 m)     Physical Exam Constitutional:      General: She is not in acute distress.    Appearance: Normal appearance. She is  well-developed.  HENT:     Head: Normocephalic and atraumatic.     Right Ear: Ear canal and external ear normal. There is impacted cerumen.     Left Ear: Ear canal and external ear normal. There is impacted cerumen.     Ears:     Comments: Visualized portion of canal without erythema, edema or exudate.  Yellow cerumen obstructing bilateral canals.  Pinna nontender including with traction. Cardiovascular:     Rate and Rhythm: Normal rate.  Pulmonary:     Effort: Pulmonary effort is normal.  Neurological:     Mental  Status: She is alert and oriented to person, place, and time.  Psychiatric:        Mood and Affect: Mood normal.        Assessment & Plan:  ROSHELL BRIGHAM is a 73 y.o. female . Bilateral impacted cerumen - Plan: Ambulatory referral to ENT Bilateral impacted cerumen with some subjective hearing loss on the right versus left.  Treatment options discussed including option of home treatment with Debrox, in office lavage with risk/benefits/alternatives discussed, or ENT evaluation.  She would like to meet with ENT for treatment.  Handout given on cerumen impaction with option of Debrox in the interim with technique discussed.  Referral placed, RTC precautions given.  No orders of the defined types were placed in this encounter.  Patient Instructions  Thank you for coming in today.  I will refer you to ear nose and throat as we discussed about see information below on wax impaction in the ear.  Over-the-counter Debrox is an option as we discussed in the meantime while you are waiting to see ENT.  If any new or worsening symptoms please let me know.  Take care!  Earwax Buildup, Adult Your ears make something called earwax. It helps keep germs called bacteria away and protects the skin in your ears. Sometimes, too much earwax can build up. This can cause discomfort or make it harder to hear. What are the causes? Earwax buildup can happen when you have too much earwax in your  ears. Earwax is made in the outer part of your ear canal. It's supposed to fall out in small amounts over time. But if your ears aren't able to clean themselves like they should, earwax can build up. What increases the risk? You're more likely to get earwax buildup if: You clean your ears with cotton swabs. You pick at your ears. You use earplugs or in-ear headphones a lot. You wear hearing aids. You may also be more likely to get it if: You're female. You're older. Your ears naturally make more earwax. You have narrow ear canals or extra hair in your ears. Your earwax is too thick or sticky. You have eczema. You're dehydrated. This means there's not enough fluid in your body. What are the signs or symptoms? Symptoms of earwax buildup include: Not being able to hear as well. A feeling of fullness in your ear. Feeling like your ear is plugged. Fluid coming from your ear. Ear pain or an itchy ear. Ringing in your ear. Coughing or problems with balance. How is this diagnosed? Earwax buildup may be diagnosed based on your symptoms, medical history, and an ear exam. During the exam, your health care provider will look into your ear with a tool called an otoscope. You may also have tests, such as a hearing test. How is this treated? Earwax buildup may be treated by: Using ear drops. Having the earwax removed by a provider. The provider may: Flush the ear with water. Use a tool called a curette that has a loop on the end. Use a suction device. Having surgery. This may be done in severe cases. Follow these instructions at home:  Cleaning your ears Clean your ears as told by your provider. You can clean the outside of your ears with a washcloth or tissue. Do not overclean your ears. Do not put anything into your ear unless told. This includes cotton swabs. General instructions Take over-the-counter and prescription medicines only as told by your provider. Drink enough fluid to keep  your pee (urine) pale yellow. This helps thin the earwax. If you have hearing aids, clean them as told. Keep all follow-up visits. If earwax builds up in your ears often or if you use hearing aids, ask your provider how often you should have your ears cleaned. Contact a health care provider if: Your ear pain gets worse. You have a fever. You have pus, blood, or other fluid coming from your ear. You have hearing loss. You have ringing in your ears that won't go away. You feel like the room is spinning. This is called vertigo. Your symptoms don't get better with treatment. This information is not intended to replace advice given to you by your health care provider. Make sure you discuss any questions you have with your health care provider. Document Revised: 10/13/2022 Document Reviewed: 10/13/2022 Elsevier Patient Education  2024 Elsevier Inc.    Signed,   Caro Christmas, MD [facility], [facility] 01/04/24 9:02 AM

## 2024-01-04 NOTE — Patient Instructions (Signed)
 Thank you for coming in today.  I will refer you to ear nose and throat as we discussed about see information below on wax impaction in the ear.  Over-the-counter Debrox is an option as we discussed in the meantime while you are waiting to see ENT.  If any new or worsening symptoms please let me know.  Take care!  Earwax Buildup, Adult Your ears make something called earwax. It helps keep germs called bacteria away and protects the skin in your ears. Sometimes, too much earwax can build up. This can cause discomfort or make it harder to hear. What are the causes? Earwax buildup can happen when you have too much earwax in your ears. Earwax is made in the outer part of your ear canal. It's supposed to fall out in small amounts over time. But if your ears aren't able to clean themselves like they should, earwax can build up. What increases the risk? You're more likely to get earwax buildup if: You clean your ears with cotton swabs. You pick at your ears. You use earplugs or in-ear headphones a lot. You wear hearing aids. You may also be more likely to get it if: You're female. You're older. Your ears naturally make more earwax. You have narrow ear canals or extra hair in your ears. Your earwax is too thick or sticky. You have eczema. You're dehydrated. This means there's not enough fluid in your body. What are the signs or symptoms? Symptoms of earwax buildup include: Not being able to hear as well. A feeling of fullness in your ear. Feeling like your ear is plugged. Fluid coming from your ear. Ear pain or an itchy ear. Ringing in your ear. Coughing or problems with balance. How is this diagnosed? Earwax buildup may be diagnosed based on your symptoms, medical history, and an ear exam. During the exam, your health care provider will look into your ear with a tool called an otoscope. You may also have tests, such as a hearing test. How is this treated? Earwax buildup may be treated  by: Using ear drops. Having the earwax removed by a provider. The provider may: Flush the ear with water. Use a tool called a curette that has a loop on the end. Use a suction device. Having surgery. This may be done in severe cases. Follow these instructions at home:  Cleaning your ears Clean your ears as told by your provider. You can clean the outside of your ears with a washcloth or tissue. Do not overclean your ears. Do not put anything into your ear unless told. This includes cotton swabs. General instructions Take over-the-counter and prescription medicines only as told by your provider. Drink enough fluid to keep your pee (urine) pale yellow. This helps thin the earwax. If you have hearing aids, clean them as told. Keep all follow-up visits. If earwax builds up in your ears often or if you use hearing aids, ask your provider how often you should have your ears cleaned. Contact a health care provider if: Your ear pain gets worse. You have a fever. You have pus, blood, or other fluid coming from your ear. You have hearing loss. You have ringing in your ears that won't go away. You feel like the room is spinning. This is called vertigo. Your symptoms don't get better with treatment. This information is not intended to replace advice given to you by your health care provider. Make sure you discuss any questions you have with your health care provider. Document  Revised: 10/13/2022 Document Reviewed: 10/13/2022 Elsevier Patient Education  2024 [employer].

## 2024-01-09 DIAGNOSIS — J301 Allergic rhinitis due to pollen: Secondary | ICD-10-CM | POA: Diagnosis not present

## 2024-01-09 DIAGNOSIS — J3089 Other allergic rhinitis: Secondary | ICD-10-CM | POA: Diagnosis not present

## 2024-01-09 DIAGNOSIS — J3081 Allergic rhinitis due to animal (cat) (dog) hair and dander: Secondary | ICD-10-CM | POA: Diagnosis not present

## 2024-01-16 DIAGNOSIS — J3089 Other allergic rhinitis: Secondary | ICD-10-CM | POA: Diagnosis not present

## 2024-01-16 DIAGNOSIS — J301 Allergic rhinitis due to pollen: Secondary | ICD-10-CM | POA: Diagnosis not present

## 2024-01-16 DIAGNOSIS — J3081 Allergic rhinitis due to animal (cat) (dog) hair and dander: Secondary | ICD-10-CM | POA: Diagnosis not present

## 2024-01-23 DIAGNOSIS — J3089 Other allergic rhinitis: Secondary | ICD-10-CM | POA: Diagnosis not present

## 2024-01-23 DIAGNOSIS — J3081 Allergic rhinitis due to animal (cat) (dog) hair and dander: Secondary | ICD-10-CM | POA: Diagnosis not present

## 2024-01-23 DIAGNOSIS — J301 Allergic rhinitis due to pollen: Secondary | ICD-10-CM | POA: Diagnosis not present

## 2024-01-30 DIAGNOSIS — J3081 Allergic rhinitis due to animal (cat) (dog) hair and dander: Secondary | ICD-10-CM | POA: Diagnosis not present

## 2024-01-30 DIAGNOSIS — J301 Allergic rhinitis due to pollen: Secondary | ICD-10-CM | POA: Diagnosis not present

## 2024-01-30 DIAGNOSIS — J3089 Other allergic rhinitis: Secondary | ICD-10-CM | POA: Diagnosis not present

## 2024-02-06 DIAGNOSIS — J3081 Allergic rhinitis due to animal (cat) (dog) hair and dander: Secondary | ICD-10-CM | POA: Diagnosis not present

## 2024-02-06 DIAGNOSIS — J3089 Other allergic rhinitis: Secondary | ICD-10-CM | POA: Diagnosis not present

## 2024-02-06 DIAGNOSIS — J301 Allergic rhinitis due to pollen: Secondary | ICD-10-CM | POA: Diagnosis not present

## 2024-02-13 DIAGNOSIS — J301 Allergic rhinitis due to pollen: Secondary | ICD-10-CM | POA: Diagnosis not present

## 2024-02-13 DIAGNOSIS — J3081 Allergic rhinitis due to animal (cat) (dog) hair and dander: Secondary | ICD-10-CM | POA: Diagnosis not present

## 2024-02-13 DIAGNOSIS — J3089 Other allergic rhinitis: Secondary | ICD-10-CM | POA: Diagnosis not present

## 2024-02-20 DIAGNOSIS — J301 Allergic rhinitis due to pollen: Secondary | ICD-10-CM | POA: Diagnosis not present

## 2024-02-20 DIAGNOSIS — J3081 Allergic rhinitis due to animal (cat) (dog) hair and dander: Secondary | ICD-10-CM | POA: Diagnosis not present

## 2024-02-20 DIAGNOSIS — J3089 Other allergic rhinitis: Secondary | ICD-10-CM | POA: Diagnosis not present

## 2024-02-27 DIAGNOSIS — J301 Allergic rhinitis due to pollen: Secondary | ICD-10-CM | POA: Diagnosis not present

## 2024-02-27 DIAGNOSIS — J3089 Other allergic rhinitis: Secondary | ICD-10-CM | POA: Diagnosis not present

## 2024-02-27 DIAGNOSIS — J3081 Allergic rhinitis due to animal (cat) (dog) hair and dander: Secondary | ICD-10-CM | POA: Diagnosis not present

## 2024-02-29 ENCOUNTER — Encounter: Payer: Self-pay | Admitting: Family Medicine

## 2024-02-29 ENCOUNTER — Ambulatory Visit (INDEPENDENT_AMBULATORY_CARE_PROVIDER_SITE_OTHER): Payer: Medicare HMO | Admitting: Family Medicine

## 2024-02-29 ENCOUNTER — Ambulatory Visit: Payer: Self-pay | Admitting: Family Medicine

## 2024-02-29 ENCOUNTER — Ambulatory Visit (INDEPENDENT_AMBULATORY_CARE_PROVIDER_SITE_OTHER)
Admission: RE | Admit: 2024-02-29 | Discharge: 2024-02-29 | Disposition: A | Discharge status: Home / Self Care | Source: Ambulatory Visit | Attending: Family Medicine | Admitting: Family Medicine

## 2024-02-29 VITALS — BP 114/70 | HR 69 | Temp 97.9°F | Ht 62.25 in | Wt 109.1 lb

## 2024-02-29 DIAGNOSIS — G4700 Insomnia, unspecified: Secondary | ICD-10-CM | POA: Diagnosis not present

## 2024-02-29 DIAGNOSIS — R7303 Prediabetes: Secondary | ICD-10-CM

## 2024-02-29 DIAGNOSIS — F419 Anxiety disorder, unspecified: Secondary | ICD-10-CM | POA: Diagnosis not present

## 2024-02-29 DIAGNOSIS — I10 Essential (primary) hypertension: Secondary | ICD-10-CM

## 2024-02-29 DIAGNOSIS — Z1159 Encounter for screening for other viral diseases: Secondary | ICD-10-CM

## 2024-02-29 DIAGNOSIS — R140 Abdominal distension (gaseous): Secondary | ICD-10-CM

## 2024-02-29 DIAGNOSIS — Z0389 Encounter for observation for other suspected diseases and conditions ruled out: Secondary | ICD-10-CM | POA: Diagnosis not present

## 2024-02-29 DIAGNOSIS — K5900 Constipation, unspecified: Secondary | ICD-10-CM

## 2024-02-29 DIAGNOSIS — E785 Hyperlipidemia, unspecified: Secondary | ICD-10-CM

## 2024-02-29 LAB — COMPREHENSIVE METABOLIC PANEL WITH GFR
ALT: 23 U/L (ref 0–35)
AST: 30 U/L (ref 0–37)
Albumin: 4.3 g/dL (ref 3.5–5.2)
Alkaline Phosphatase: 53 U/L (ref 39–117)
BUN: 12 mg/dL (ref 6–23)
CO2: 33 meq/L — ABNORMAL HIGH (ref 19–32)
Calcium: 9.3 mg/dL (ref 8.4–10.5)
Chloride: 102 meq/L (ref 96–112)
Creatinine, Ser: 0.66 mg/dL (ref 0.40–1.20)
GFR: 87.56 mL/min (ref >60.00)
Glucose, Bld: 87 mg/dL (ref 70–99)
Potassium: 3.7 meq/L (ref 3.5–5.1)
Sodium: 141 meq/L (ref 135–145)
Total Bilirubin: 0.8 mg/dL (ref 0.2–1.2)
Total Protein: 6.5 g/dL (ref 6.0–8.3)

## 2024-02-29 LAB — HEMOGLOBIN A1C: Hgb A1c MFr Bld: 5.9 % (ref 4.6–6.5)

## 2024-02-29 LAB — LIPID PANEL
Cholesterol: 188 mg/dL (ref 0–200)
HDL: 87.2 mg/dL (ref >39.00)
LDL Cholesterol: 88 mg/dL (ref 0–99)
NonHDL: 101.1
Total CHOL/HDL Ratio: 2
Triglycerides: 67 mg/dL (ref 0.0–149.0)
VLDL: 13.4 mg/dL (ref 0.0–40.0)

## 2024-02-29 MED ORDER — CLONAZEPAM 0.5 MG PO TABS: 0.5000 mg | ORAL_TABLET | Freq: Every day | ORAL | 1 refills | Status: DC

## 2024-02-29 NOTE — Patient Instructions (Addendum)
 Thanks for coming in today. I do suspect that you have some constipation possibly from the hotter days and possible dehydration.  Continue to drink plenty of fluids throughout the day, aim for 48 to 64 ounces of water per day, fiber in the diet with vegetables and fruits, whole grains can help with bowel movements.  Please have x-ray at the [facility] location below and we can determine if there is a significant mount of stool at this time, and then can decide if other meds needed.  If you have not tolerated MiraLAX in the past, Colace is a stool softener that may also be helpful but lets see what the x-ray shows first.  If any concerns on your other labs I will let you know but no change in meds at this time.  Take care.   [facility] or xray: Walk in 8:30-4:30 during weekdays, no appointment needed [address]  [place], [place] [address]   About Constipation  Constipation Overview Constipation is the most common gastrointestinal complaint -- about 4 million Americans experience constipation and make 2.5 million physician visits a year to get help for the problem.  Constipation can occur when the colon absorbs too much water, the colon's muscle contraction is slow or sluggish, and/or there is delayed transit time through the colon.  The result is stool that is hard and dry.  Indicators of constipation include straining during bowel movements greater than 25% of the time, having fewer than three bowel movements per week, and/or the feeling of incomplete evacuation.  There are established guidelines ([place] II ) for defining constipation. A person needs to have two or more of the following symptoms for at least 12 weeks (not necessarily consecutive) in the preceding 12 months: Straining in  greater than 25% of bowel movements Lumpy or hard stools in greater than 25% of bowel movements Sensation of incomplete emptying in greater than 25% of bowel movements Sensation of anorectal  obstruction/blockade in greater than 25% of bowel movements Manual maneuvers to help empty greater than 25% of bowel movements (e.g., digital evacuation, support of the pelvic floor)  Less than  3 bowel movements/week Loose stools are not present, and criteria for irritable bowel syndrome are insufficient  Common Causes of Constipation Lack of fiber in your diet Lack of physical activity Medications, including iron and calcium  supplements  Dairy intake Dehydration Abuse of laxatives Travel Irritable Bowel Syndrome Pregnancy Luteal phase of menstruation (after ovulation and before menses) Colorectal problems Intestinal Dysfunction  Treating Constipation  There are several ways of treating constipation, including changes to diet and exercise, use of laxatives, adjustments to the pelvic floor, and scheduled toileting.  These treatments include: increasing fiber and fluids in the diet  increasing physical activity learning muscle coordination  learning proper toileting techniques and toileting modifications  designing and sticking  to a toileting schedule     2007, [employer] Doc.22

## 2024-02-29 NOTE — Progress Notes (Signed)
 Subjective:  Patient ID: Tracey Ayala, female    DOB: 09-Jun-1951  Age: 73 y.o. MRN: [id number]  CC:  Chief Complaint  Patient presents with   Medical Management of Chronic Issues    Pt is here for 6 month med mgnt Pt reports constipation in past 2 month. No OTC med taken     HPI Tracey Ayala presents for   Follow-up of chronic conditions and new concern as above  Constipation Noted past 3 weeks. Noticed after AC went out at home and hot weather. Drinking water, 1-2 glasses in am - some during day. Last BM this am - soft, watery today. Some bloating sensation, not emptying.   Small stools. Not hard. Eating fruit/vegetables. Bloated at times. No fever, no pain. No night sweats/weight loss.  Tx: Smooth move otc tx last night.  No other bowel changes or blood in stool.  Tried miralax in past - caused vaginal irritation.   Prediabetes: No current meds, weight stable. Lab Results  Component Value Date   HGBA1C 5.8 08/31/2023   Wt Readings from Last 3 Encounters:  02/29/24 109 lb 2 oz (49.5 kg)  01/04/24 109 lb (49.4 kg)  08/31/23 108 lb 6.4 oz (49.2 kg)   Hypertension: Lisinopril  10 mg daily without any new side effects. Home readings:  129/70-80.  BP Readings from Last 3 Encounters:  02/29/24 114/70  01/04/24 124/80  08/31/23 126/80   Lab Results  Component Value Date   CREATININE 0.67 08/31/2023   Hyperlipidemia: Lipitor 10 mg daily without new myalgias/arthralgias Lab Results  Component Value Date   CHOL 197 08/31/2023   HDL 87.10 08/31/2023   LDLCALC 91 08/31/2023   TRIG 94.0 08/31/2023   CHOLHDL 2 08/31/2023   Lab Results  Component Value Date   ALT 18 08/31/2023   AST 29 08/31/2023   ALKPHOS 61 08/31/2023   BILITOT 0.7 08/31/2023   Anxiety with insomnia Treated with Celexa  20 mg daily, same dose for years.  Klonopin  half pill most nights has been effective previously, multiple over-the-counter treatments tried in its place none worked  including melatonin.  Denies any falls dizziness parasomnias or prior side effects with clonazepam  and still working well with intermittent, low dosing.  Controlled substance database reviewed.  #30 last filled on 12/06/2023, previously 08/31/2023. Anxiety controlled. No side effects with above meds.     02/29/2024    9:57 AM 08/31/2023   10:19 AM 02/09/2023   11:07 AM 11/23/2022   11:57 AM  GAD 7 : Generalized Anxiety Score  Nervous, Anxious, on Edge 0 0 0 0  Control/stop worrying 0 0 0 0  Worry too much - different things 0 0 0 0  Trouble relaxing 0 0 0 0  Restless 0 0 0 0  Easily annoyed or irritable 0 0 0 0  Afraid - awful might happen 0 0 0 0  Total GAD 7 Score 0 0 0 0  Anxiety Difficulty Not difficult at all   Not difficult at all      Liver mass Treated by oncology, Dr. Romero at [facility].  Note reviewed from February 14.  CA 19-9 was down to 40 from previous 256 and CEA remain normal.  Cyst that was followed previously is smaller in size.  Unlikely that cyst would turn into cancer and not thought to need surgery.  Plan for yearly monitoring with MRI.     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   Impacted cerumen of right ear 11/09/2022   Syncope and collapse 12/23/2021   Mitral valve prolapse 12/23/2021   Essential hypertension 12/23/2021   Endometriosis    Allergic rhinitis 12/02/2020   Allergic rhinitis due to animal (cat) (dog) hair and dander 12/02/2020   Chronic allergic conjunctivitis 12/02/2020   Past Medical History:  Diagnosis Date   Adnexal mass    Allergy    Receives injections once a week   Anxiety    Basal cell carcinoma 03/04/2013   bcc right nostril =mohs   Cataract    Endometriosis    Essential hypertension 12/23/2021   Fibroma of right ovary 12/15/2020   Hypercholesterolemia     Hypertension    Mitral valve prolapse 12/23/2021   Syncope and collapse 12/23/2021   Wears glasses    Past Surgical History:  Procedure Laterality Date   basal cell carcinoma from left shoulder  2018   mohs procedure  2014   right nostril   ovarian cystectomy and appendectomy Left 1978   ROBOTIC ASSISTED SALPINGO OOPHERECTOMY Bilateral 12/07/2020   Procedure: XI ROBOTIC ASSISTED BILATERAL SALPINGO OOPHORECTOMY, MINI LAPAROTOMY, PELVIC WASHINGS;  Surgeon: Viktoria Comer SAUNDERS, MD;  Location: [facility];  Service: Gynecology;  Laterality: Bilateral;   WISDOM TOOTH EXTRACTION  1974   Allergies  Allergen Reactions   Codeine Nausea Only, Nausea And Vomiting and Other (See Comments)    Sick   Other reaction(s): GI upset nausea  Sick   Other reaction(s): GI upset nausea  Sick   Other reaction(s): GI upset nausea  Sick   Other reaction(s): GI upset nausea   Fosamax [Alendronate] Rash   Prior to Admission medications   Medication Sig Start Date End Date Taking? Authorizing Provider  atorvastatin  (LIPITOR) 10 MG tablet TAKE 1 TABLET BY MOUTH EVERY DAY 08/31/23  Yes Levora Reyes SAUNDERS, MD  azelastine (ASTELIN) 0.1 % nasal spray 1-2 sprays in each nostril Nasally Twice a day as needed; Duration: 30 day(s)   Yes [provider]  cetirizine (ZYRTEC) 10 MG tablet 1 tablet Orally Once a day; Duration: 30 days 06/21/22  Yes [provider]  Cholecalciferol (VITAMIN D ) 50 MCG (2000 UT) CAPS Take 2,000 Units by mouth daily. Vitamin d  3   Yes [provider]  citalopram  (CELEXA ) 20 MG tablet Take 1 tablet (20 mg total) by mouth every evening. 08/31/23  Yes Levora Reyes SAUNDERS, MD  clonazePAM  (KLONOPIN ) 0.5 MG tablet Take 1 tablet (0.5 mg total) by mouth daily. 08/31/23  Yes Levora Reyes SAUNDERS, MD  EPINEPHrine 0.3 mg/0.3 mL IJ SOAJ injection Inject 0.3 mg into the muscle as needed for anaphylaxis.   Yes [provider]  fexofenadine (ALLEGRA) 180 MG tablet 1  tablet Swallow whole with water; do not take with fruit juices. Orally Once a day, prn; Duration: 30 days   Yes [provider]  lisinopril  (ZESTRIL ) 10 MG tablet Take 1 tablet (10 mg total) by mouth daily. 08/31/23  Yes Levora Reyes SAUNDERS, MD  PRESCRIPTION MEDICATION Allergy Injections once a week.   Yes [provider]   Social History   Socioeconomic History   Marital status: Widowed    Spouse name: Not on file   Number of children: Not on file   Years of education: Not on file   Highest education level: Bachelor's degree (e.g., BA, AB, BS)  Occupational History   Occupation: retired  Tobacco Use   Smoking status: Never   Smokeless tobacco: Never  Vaping Use   Vaping status: Never Used  Substance and Sexual Activity   Alcohol use: Never   Drug use: Never   Sexual activity: Not Currently  Other Topics Concern   Not on file  Social History Narrative   Not on file   Social Drivers of Health   Financial Resource Strain: Low Risk  (02/23/2024)   Overall Financial Resource Strain (CARDIA)    Difficulty of Paying Living Expenses: Not hard at all  Food Insecurity: No Food Insecurity (02/23/2024)   Hunger Vital Sign    Worried About Running Out of Food in the Last Year: Never true    Ran Out of Food in the Last Year: Never true  Transportation Needs: No Transportation Needs (02/23/2024)   PRAPARE - Administrator, Civil Service (Medical): No    Lack of Transportation (Non-Medical): No  Physical Activity: Sufficiently Active (02/23/2024)   Exercise Vital Sign    Days of Exercise per Week: 5 days    Minutes of Exercise per Session: 50 min  Stress: No Stress Concern Present (02/23/2024)   [employer] of Occupational Health - Occupational Stress Questionnaire    Feeling of Stress: Only a little  Social Connections: Moderately Integrated (02/23/2024)   Social Connection and Isolation Panel    Frequency of Communication with Friends and Family: More  than three times a week    Frequency of Social Gatherings with Friends and Family: More than three times a week    Attends Religious Services: More than 4 times per year    Active Member of [employer] or Organizations: Yes    Attends Banker Meetings: More than 4 times per year    Marital Status: Widowed  Intimate Partner Violence: Not At Risk (01/25/2023)   Humiliation, Afraid, Rape, and Kick questionnaire    Fear of Current or Ex-Partner: No    Emotionally Abused: No    Physically Abused: No    Sexually Abused: No    Review of Systems  Constitutional:  Negative for fatigue and unexpected weight change.  Respiratory:  Negative for chest tightness and shortness of breath.   Cardiovascular:  Negative for chest pain, palpitations and leg swelling.  Gastrointestinal:  Negative for abdominal pain and blood in stool.  Neurological:  Negative for dizziness, syncope, light-headedness and headaches.     Objective:   Vitals:   02/29/24 1000  BP: 114/70  Pulse: 69  Temp: 97.9 F (36.6 C)  SpO2: 96%  Weight: 109 lb 2 oz (49.5 kg)  Height: 5' 2.25 (1.581 m)     Physical Exam Vitals reviewed.  Constitutional:      Appearance: Normal appearance. She is well-developed.  HENT:     Head: Normocephalic and atraumatic.  Eyes:     Conjunctiva/sclera: Conjunctivae normal.     Pupils: Pupils are equal, round, and reactive to light.  Neck:     Vascular: No carotid bruit.  Cardiovascular:     Rate and Rhythm: Normal rate and regular rhythm.     Heart sounds: Normal heart sounds.  Pulmonary:     Effort: Pulmonary effort is normal.     Breath sounds: Normal breath sounds.  Abdominal:     General: Abdomen is flat. There is no distension.     Palpations: Abdomen is soft. There is no mass or pulsatile mass.     Tenderness:  There is no abdominal tenderness. There is no guarding or rebound.  Musculoskeletal:     Right lower leg: No edema.     Left lower leg: No edema.  Skin:     General: Skin is warm and dry.  Neurological:     Mental Status: She is alert and oriented to person, place, and time.  Psychiatric:        Mood and Affect: Mood normal.        Behavior: Behavior normal.        Assessment & Plan:  Tracey Ayala is a 73 y.o. female . Constipation, unspecified constipation type - Plan: DG Abd 1 View Bloating symptom - Plan: DG Abd 1 View  - Suspected constipation after hot days, air conditioning broke and may have been volume depleted/dehydration at that time.  Some loose or watery stools may be related to over-the-counter treatment.  Will check 1 view abdomen x-ray with her bloating and persistent incomplete emptying sensation to see if there is still significant stool burden then could consider other treatment.  As above she reports MiraLAX has caused some irritation in the past, could look at other options, or at the minimum Colace as a stool softener temporarily, continue fluids, fiber in the diet, handout given with RTC precautions.  Anxiety - Plan: clonazePAM  (KLONOPIN ) 0.5 MG tablet Insomnia, unspecified type - Plan: clonazePAM  (KLONOPIN ) 0.5 MG tablet  - Stable with current use of Celexa  and low-dose Klonopin  without any side effects, continue same.  Prediabetes - Plan: Hemoglobin A1c  - Weight stable, check A1c and adjust plan accordingly.  Hyperlipidemia, unspecified hyperlipidemia type - Plan: Comprehensive metabolic panel with GFR, Lipid panel  - Tolerating current med regimen, check labs and adjust accordingly.  No changes.  Essential hypertension - Plan: Comprehensive metabolic panel with GFR  - Stable on current med regimen, check labs, no changes, 15-month follow-up.  Need for hepatitis C screening test - Plan: Hepatitis C Antibody   Meds ordered this encounter  Medications   clonazePAM  (KLONOPIN ) 0.5 MG tablet    Sig: Take 1 tablet (0.5 mg total) by mouth daily.    Dispense:  30 tablet    Refill:  1    Not to exceed 4  additional fills before 08/08/2023   Patient Instructions  Thanks for coming in today. I do suspect that you have some constipation possibly from the hotter days and possible dehydration.  Continue to drink plenty of fluids throughout the day, aim for 48 to 64 ounces of water per day, fiber in the diet with vegetables and fruits, whole grains can help with bowel movements.  Please have x-ray at the [facility] location below and we can determine if there is a significant mount of stool at this time, and then can decide if other meds needed.  If you have not tolerated MiraLAX in the past, Colace is a stool softener that may also be helpful but lets see what the x-ray shows first.  If any concerns on your other labs I will let you know but no change in meds at this time.  Take care.   [facility] or xray: Walk in 8:30-4:30 during weekdays, no appointment needed [address]  [place], [place] [address]   About Constipation  Constipation Overview Constipation is the most common gastrointestinal complaint -- about 4 million Americans experience constipation and make 2.5 million physician visits a year to get help for the problem.  Constipation can occur when the  colon absorbs too much water, the colon's muscle contraction is slow or sluggish, and/or there is delayed transit time through the colon.  The result is stool that is hard and dry.  Indicators of constipation include straining during bowel movements greater than 25% of the time, having fewer than three bowel movements per week, and/or the feeling of incomplete evacuation.  There are established guidelines ([place] II ) for defining constipation. A person needs to have two or more of the following symptoms for at least 12 weeks (not necessarily consecutive) in the preceding 12 months: Straining in  greater than 25% of bowel movements Lumpy or hard stools in greater than 25% of bowel movements Sensation of incomplete emptying in greater than 25%  of bowel movements Sensation of anorectal obstruction/blockade in greater than 25% of bowel movements Manual maneuvers to help empty greater than 25% of bowel movements (e.g., digital evacuation, support of the pelvic floor)  Less than  3 bowel movements/week Loose stools are not present, and criteria for irritable bowel syndrome are insufficient  Common Causes of Constipation Lack of fiber in your diet Lack of physical activity Medications, including iron and calcium  supplements  Dairy intake Dehydration Abuse of laxatives Travel Irritable Bowel Syndrome Pregnancy Luteal phase of menstruation (after ovulation and before menses) Colorectal problems Intestinal Dysfunction  Treating Constipation  There are several ways of treating constipation, including changes to diet and exercise, use of laxatives, adjustments to the pelvic floor, and scheduled toileting.  These treatments include: increasing fiber and fluids in the diet  increasing physical activity learning muscle coordination  learning proper toileting techniques and toileting modifications  designing and sticking  to a toileting schedule     2007, [employer] Doc.22    Signed,   Reyes Pines, MD [facility], [facility] 02/29/24 10:49 AM

## 2024-03-01 LAB — HEPATITIS C ANTIBODY: Hepatitis C Ab: NONREACTIVE

## 2024-03-05 DIAGNOSIS — J3089 Other allergic rhinitis: Secondary | ICD-10-CM | POA: Diagnosis not present

## 2024-03-05 DIAGNOSIS — J301 Allergic rhinitis due to pollen: Secondary | ICD-10-CM | POA: Diagnosis not present

## 2024-03-05 DIAGNOSIS — J3081 Allergic rhinitis due to animal (cat) (dog) hair and dander: Secondary | ICD-10-CM | POA: Diagnosis not present

## 2024-03-08 DIAGNOSIS — Z1231 Encounter for screening mammogram for malignant neoplasm of breast: Secondary | ICD-10-CM | POA: Diagnosis not present

## 2024-03-08 LAB — HM MAMMOGRAPHY

## 2024-03-12 DIAGNOSIS — J3089 Other allergic rhinitis: Secondary | ICD-10-CM | POA: Diagnosis not present

## 2024-03-12 DIAGNOSIS — J3081 Allergic rhinitis due to animal (cat) (dog) hair and dander: Secondary | ICD-10-CM | POA: Diagnosis not present

## 2024-03-12 DIAGNOSIS — J301 Allergic rhinitis due to pollen: Secondary | ICD-10-CM | POA: Diagnosis not present

## 2024-03-18 ENCOUNTER — Ambulatory Visit (INDEPENDENT_AMBULATORY_CARE_PROVIDER_SITE_OTHER): Admitting: Otolaryngology

## 2024-03-18 ENCOUNTER — Encounter (INDEPENDENT_AMBULATORY_CARE_PROVIDER_SITE_OTHER): Payer: Self-pay | Admitting: Otolaryngology

## 2024-03-18 VITALS — BP 143/79 | HR 70 | Ht 62.0 in | Wt 108.0 lb

## 2024-03-18 DIAGNOSIS — H6123 Impacted cerumen, bilateral: Secondary | ICD-10-CM

## 2024-03-18 DIAGNOSIS — H608X3 Other otitis externa, bilateral: Secondary | ICD-10-CM

## 2024-03-18 MED ORDER — MOMETASONE FUROATE 0.1 % EX CREA: TOPICAL_CREAM | CUTANEOUS | 3 refills | Status: AC

## 2024-03-19 DIAGNOSIS — H6123 Impacted cerumen, bilateral: Secondary | ICD-10-CM | POA: Insufficient documentation

## 2024-03-19 DIAGNOSIS — J301 Allergic rhinitis due to pollen: Secondary | ICD-10-CM | POA: Diagnosis not present

## 2024-03-19 DIAGNOSIS — J3081 Allergic rhinitis due to animal (cat) (dog) hair and dander: Secondary | ICD-10-CM | POA: Diagnosis not present

## 2024-03-19 DIAGNOSIS — H608X3 Other otitis externa, bilateral: Secondary | ICD-10-CM | POA: Insufficient documentation

## 2024-03-19 DIAGNOSIS — J3089 Other allergic rhinitis: Secondary | ICD-10-CM | POA: Diagnosis not present

## 2024-03-19 NOTE — Progress Notes (Signed)
 CC: Itchy ears  HPI:  Tracey Ayala is a 73 y.o. female who presents today complaining of bilateral itchy ears for the past 2 months.  She was recently seen by her primary care physician, and was noted to have cerumen impaction.  The patient denies any significant otalgia, otorrhea, or vertigo.  She has no previous ENT surgery.  Past Medical History:  Diagnosis Date   Adnexal mass    Allergy    Receives injections once a week   Anxiety    Basal cell carcinoma 03/04/2013   bcc right nostril =mohs   Cataract    Endometriosis    Essential hypertension 12/23/2021   Fibroma of right ovary 12/15/2020   Hypercholesterolemia    Hypertension    Mitral valve prolapse 12/23/2021   Syncope and collapse 12/23/2021   Wears glasses     Past Surgical History:  Procedure Laterality Date   basal cell carcinoma from left shoulder  2018   mohs procedure  2014   right nostril   ovarian cystectomy and appendectomy Left 1978   ROBOTIC ASSISTED SALPINGO OOPHERECTOMY Bilateral 12/07/2020   Procedure: XI ROBOTIC ASSISTED BILATERAL SALPINGO OOPHORECTOMY, MINI LAPAROTOMY, PELVIC WASHINGS;  Surgeon: Viktoria Comer SAUNDERS, MD;  Location: [facility];  Service: Gynecology;  Laterality: Bilateral;   WISDOM TOOTH EXTRACTION  1974    Family History  Problem Relation Age of Onset   Stroke Mother    Hypertension Mother    Hyperlipidemia Mother    Diabetes Mother    Stroke Father    Early death Father    Hypertension Father    Hyperlipidemia Father    Arthritis Sister    Cancer Sister        carcinoid tumor of the appendix   Colon cancer Maternal Aunt    Breast cancer Maternal Aunt    Heart failure Maternal Grandmother    Stroke Maternal Grandmother    Endometrial cancer Neg Hx    Ovarian cancer Neg Hx    Pancreatic cancer Neg Hx    Prostate cancer Neg Hx     Social History:  reports that she has never smoked. She has never used smokeless tobacco. She reports that she does not drink  alcohol and does not use drugs.  Allergies:  Allergies  Allergen Reactions   Codeine Nausea Only, Nausea And Vomiting and Other (See Comments)    Sick   Other reaction(s): GI upset nausea  Sick   Other reaction(s): GI upset nausea  Sick   Other reaction(s): GI upset nausea  Sick   Other reaction(s): GI upset nausea   Fosamax [Alendronate] Rash    Prior to Admission medications   Medication Sig Start Date End Date Taking? Authorizing Provider  atorvastatin  (LIPITOR) 10 MG tablet TAKE 1 TABLET BY MOUTH EVERY DAY 08/31/23  Yes Levora Reyes SAUNDERS, MD  azelastine (ASTELIN) 0.1 % nasal spray 1-2 sprays in each nostril Nasally Twice a day as needed; Duration: 30 day(s)   Yes [provider]  cetirizine (ZYRTEC) 10 MG tablet 1 tablet Orally Once a day; Duration: 30 days 06/21/22  Yes [provider]  Cholecalciferol (VITAMIN D ) 50 MCG (2000 UT) CAPS Take 2,000 Units by mouth daily. Vitamin d  3   Yes [provider]  citalopram  (CELEXA ) 20 MG tablet Take 1 tablet (20 mg total) by mouth every evening. 08/31/23  Yes Levora Reyes SAUNDERS, MD  clonazePAM  (KLONOPIN ) 0.5 MG tablet Take 1 tablet (0.5 mg total) by mouth daily. 02/29/24  Yes Levora Reyes SAUNDERS, MD  EPINEPHrine 0.3 mg/0.3 mL IJ SOAJ injection Inject 0.3 mg into the muscle as needed for anaphylaxis.   Yes [provider]  fexofenadine (ALLEGRA) 180 MG tablet 1 tablet Swallow whole with water; do not take with fruit juices. Orally Once a day, prn; Duration: 30 days   Yes [provider]  lisinopril  (ZESTRIL ) 10 MG tablet Take 1 tablet (10 mg total) by mouth daily. 08/31/23  Yes Levora Reyes SAUNDERS, MD  mometasone  (ELOCON ) 0.1 % cream Apply topically daily as needed for itch 03/18/24  Yes Karis Clunes, MD  PRESCRIPTION MEDICATION Allergy Injections once a week.   Yes [provider]    Blood pressure (!) 143/79, pulse 70, height 5' 2 (1.575 m), weight 108 lb (49 kg), SpO2 98%. Exam: General:  Communicates without difficulty, well nourished, no acute distress. Head: Normocephalic, no evidence injury, no tenderness, facial buttresses intact without stepoff. Face/sinus: No tenderness to palpation and percussion. Facial movement is normal and symmetric. Eyes: PERRL, EOMI. No scleral icterus, conjunctivae clear. Neuro: CN II exam reveals vision grossly intact.  No nystagmus at any point of gaze. Ears: Auricles well formed without lesions.  Bilateral cerumen impaction.  Nose: External evaluation reveals normal support and skin without lesions.  Dorsum is intact.  Anterior rhinoscopy reveals congested mucosa over anterior aspect of inferior turbinates and intact septum.  No purulence noted. Oral:  Oral cavity and oropharynx are intact, symmetric, without erythema or edema.  Mucosa is moist without lesions. Neck: Full range of motion without pain.  There is no significant lymphadenopathy.  No masses palpable.  Thyroid bed within normal limits to palpation.  Parotid glands and submandibular glands equal bilaterally without mass.  Trachea is midline. Neuro:  CN 2-12 grossly intact.   Procedure: Bilateral cerumen disimpaction Anesthesia: None Description: Under the operating microscope, the cerumen is carefully removed with a combination of cerumen currette, alligator forceps, and suction catheters.  After the cerumen is removed, the TMs are noted to be normal.  Eczematous changes are noted within the ear canals bilaterally.  No mass, erythema, or lesions. The patient tolerated the procedure well.    Assessment: 1.  Bilateral cerumen impaction.  After the cerumen removal procedure, both tympanic membranes and middle ear spaces are noted to be normal. 2.  Bilateral chronic eczematous otitis externa.  Plan: 1.  Otomicroscopy with bilateral cerumen disimpaction. 2.  Elocon  cream to treat the chronic eczematous otitis externa. 3.  The patient is encouraged to call with any questions or concerns.  Daliya Parchment W  Nilesh Stegall 03/19/2024, 11:24 AM

## 2024-03-26 DIAGNOSIS — J3089 Other allergic rhinitis: Secondary | ICD-10-CM | POA: Diagnosis not present

## 2024-03-26 DIAGNOSIS — J3081 Allergic rhinitis due to animal (cat) (dog) hair and dander: Secondary | ICD-10-CM | POA: Diagnosis not present

## 2024-03-26 DIAGNOSIS — J301 Allergic rhinitis due to pollen: Secondary | ICD-10-CM | POA: Diagnosis not present

## 2024-04-02 DIAGNOSIS — J3081 Allergic rhinitis due to animal (cat) (dog) hair and dander: Secondary | ICD-10-CM | POA: Diagnosis not present

## 2024-04-02 DIAGNOSIS — J301 Allergic rhinitis due to pollen: Secondary | ICD-10-CM | POA: Diagnosis not present

## 2024-04-02 DIAGNOSIS — J3089 Other allergic rhinitis: Secondary | ICD-10-CM | POA: Diagnosis not present

## 2024-04-09 DIAGNOSIS — J301 Allergic rhinitis due to pollen: Secondary | ICD-10-CM | POA: Diagnosis not present

## 2024-04-09 DIAGNOSIS — J3089 Other allergic rhinitis: Secondary | ICD-10-CM | POA: Diagnosis not present

## 2024-04-09 DIAGNOSIS — J3081 Allergic rhinitis due to animal (cat) (dog) hair and dander: Secondary | ICD-10-CM | POA: Diagnosis not present

## 2024-04-16 DIAGNOSIS — J301 Allergic rhinitis due to pollen: Secondary | ICD-10-CM | POA: Diagnosis not present

## 2024-04-16 DIAGNOSIS — J3081 Allergic rhinitis due to animal (cat) (dog) hair and dander: Secondary | ICD-10-CM | POA: Diagnosis not present

## 2024-04-16 DIAGNOSIS — J3089 Other allergic rhinitis: Secondary | ICD-10-CM | POA: Diagnosis not present

## 2024-04-23 DIAGNOSIS — J301 Allergic rhinitis due to pollen: Secondary | ICD-10-CM | POA: Diagnosis not present

## 2024-04-23 DIAGNOSIS — J3089 Other allergic rhinitis: Secondary | ICD-10-CM | POA: Diagnosis not present

## 2024-04-23 DIAGNOSIS — J3081 Allergic rhinitis due to animal (cat) (dog) hair and dander: Secondary | ICD-10-CM | POA: Diagnosis not present

## 2024-04-30 ENCOUNTER — Ambulatory Visit (INDEPENDENT_AMBULATORY_CARE_PROVIDER_SITE_OTHER): Admitting: *Deleted

## 2024-04-30 VITALS — Ht 62.5 in | Wt 108.0 lb

## 2024-04-30 DIAGNOSIS — Z0000 Encounter for general adult medical examination without abnormal findings: Secondary | ICD-10-CM

## 2024-04-30 DIAGNOSIS — J3089 Other allergic rhinitis: Secondary | ICD-10-CM | POA: Diagnosis not present

## 2024-04-30 DIAGNOSIS — J301 Allergic rhinitis due to pollen: Secondary | ICD-10-CM | POA: Diagnosis not present

## 2024-04-30 DIAGNOSIS — J3081 Allergic rhinitis due to animal (cat) (dog) hair and dander: Secondary | ICD-10-CM | POA: Diagnosis not present

## 2024-04-30 NOTE — Patient Instructions (Signed)
 [name] , Thank you for taking time to come for your Medicare Wellness Visit. I appreciate your ongoing commitment to your health goals. Please review the following plan we discussed and let me know if I can assist you in the future.   Screening recommendations/referrals: Colonoscopy: up to date Mammogram: up to date Bone Density: up to date Recommended yearly ophthalmology/optometry visit for glaucoma screening and checkup Recommended yearly dental visit for hygiene and checkup  Vaccinations: Influenza vaccine: Education provided Pneumococcal vaccine:Education provided Tdap vaccine: Education provided Shingles vaccine: up to date      Preventive Care 65 Years and Older, Female Preventive care refers to lifestyle choices and visits with your health care provider that can promote health and wellness. What does preventive care include? A yearly physical exam. This is also called an annual well check. Dental exams once or twice a year. Routine eye exams. Ask your health care provider how often you should have your eyes checked. Personal lifestyle choices, including: Daily care of your teeth and gums. Regular physical activity. Eating a healthy diet. Avoiding tobacco and drug use. Limiting alcohol use. Practicing safe sex. Taking low-dose aspirin every day. Taking vitamin and mineral supplements as recommended by your health care provider. What happens during an annual well check? The services and screenings done by your health care provider during your annual well check will depend on your age, overall health, lifestyle risk factors, and family history of disease. Counseling  Your health care provider may ask you questions about your: Alcohol use. Tobacco use. Drug use. Emotional well-being. Home and relationship well-being. Sexual activity. Eating habits. History of falls. Memory and ability to understand (cognition). Work and work Astronomer. Reproductive  health. Screening  You may have the following tests or measurements: Height, weight, and BMI. Blood pressure. Lipid and cholesterol levels. These may be checked every 5 years, or more frequently if you are over 68 years old. Skin check. Lung cancer screening. You may have this screening every year starting at age 83 if you have a 30-pack-year history of smoking and currently smoke or have quit within the past 15 years. Fecal occult blood test (FOBT) of the stool. You may have this test every year starting at age 57. Flexible sigmoidoscopy or colonoscopy. You may have a sigmoidoscopy every 5 years or a colonoscopy every 10 years starting at age 34. Hepatitis C blood test. Hepatitis B blood test. Sexually transmitted disease (STD) testing. Diabetes screening. This is done by checking your blood sugar (glucose) after you have not eaten for a while (fasting). You may have this done every 1-3 years. Bone density scan. This is done to screen for osteoporosis. You may have this done starting at age 17. Mammogram. This may be done every 1-2 years. Talk to your health care provider about how often you should have regular mammograms. Talk with your health care provider about your test results, treatment options, and if necessary, the need for more tests. Vaccines  Your health care provider may recommend certain vaccines, such as: Influenza vaccine. This is recommended every year. Tetanus, diphtheria, and acellular pertussis (Tdap, Td) vaccine. You may need a Td booster every 10 years. Zoster vaccine. You may need this after age 51. Pneumococcal 13-valent conjugate (PCV13) vaccine. One dose is recommended after age 67. Pneumococcal polysaccharide (PPSV23) vaccine. One dose is recommended after age 13. Talk to your health care provider about which screenings and vaccines you need and how often you need them. This information is not intended  to replace advice given to you by your health care provider.  Make sure you discuss any questions you have with your health care provider. Document Released: 08/28/2015 Document Revised: 04/20/2016 Document Reviewed: 06/02/2015 Elsevier Interactive Patient Education  2017 [employer].  Fall Prevention in the Home Falls can cause injuries. They can happen to people of all ages. There are many things you can do to make your home safe and to help prevent falls. What can I do on the outside of my home? Regularly fix the edges of walkways and driveways and fix any cracks. Remove anything that might make you trip as you walk through a door, such as a raised step or threshold. Trim any bushes or trees on the path to your home. Use bright outdoor lighting. Clear any walking paths of anything that might make someone trip, such as rocks or tools. Regularly check to see if handrails are loose or broken. Make sure that both sides of any steps have handrails. Any raised decks and porches should have guardrails on the edges. Have any leaves, snow, or ice cleared regularly. Use sand or salt on walking paths during winter. Clean up any spills in your garage right away. This includes oil or grease spills. What can I do in the bathroom? Use night lights. Install grab bars by the toilet and in the tub and shower. Do not use towel bars as grab bars. Use non-skid mats or decals in the tub or shower. If you need to sit down in the shower, use a plastic, non-slip stool. Keep the floor dry. Clean up any water that spills on the floor as soon as it happens. Remove soap buildup in the tub or shower regularly. Attach bath mats securely with double-sided non-slip rug tape. Do not have throw rugs and other things on the floor that can make you trip. What can I do in the bedroom? Use night lights. Make sure that you have a light by your bed that is easy to reach. Do not use any sheets or blankets that are too big for your bed. They should not hang down onto the floor. Have a  firm chair that has side arms. You can use this for support while you get dressed. Do not have throw rugs and other things on the floor that can make you trip. What can I do in the kitchen? Clean up any spills right away. Avoid walking on wet floors. Keep items that you use a lot in easy-to-reach places. If you need to reach something above you, use a strong step stool that has a grab bar. Keep electrical cords out of the way. Do not use floor polish or wax that makes floors slippery. If you must use wax, use non-skid floor wax. Do not have throw rugs and other things on the floor that can make you trip. What can I do with my stairs? Do not leave any items on the stairs. Make sure that there are handrails on both sides of the stairs and use them. Fix handrails that are broken or loose. Make sure that handrails are as long as the stairways. Check any carpeting to make sure that it is firmly attached to the stairs. Fix any carpet that is loose or worn. Avoid having throw rugs at the top or bottom of the stairs. If you do have throw rugs, attach them to the floor with carpet tape. Make sure that you have a light switch at the top of the stairs and  the bottom of the stairs. If you do not have them, ask someone to add them for you. What else can I do to help prevent falls? Wear shoes that: Do not have high heels. Have rubber bottoms. Are comfortable and fit you well. Are closed at the toe. Do not wear sandals. If you use a stepladder: Make sure that it is fully opened. Do not climb a closed stepladder. Make sure that both sides of the stepladder are locked into place. Ask someone to hold it for you, if possible. Clearly mark and make sure that you can see: Any grab bars or handrails. First and last steps. Where the edge of each step is. Use tools that help you move around (mobility aids) if they are needed. These include: Canes. Walkers. Scooters. Crutches. Turn on the lights when you  go into a dark area. Replace any light bulbs as soon as they burn out. Set up your furniture so you have a clear path. Avoid moving your furniture around. If any of your floors are uneven, fix them. If there are any pets around you, be aware of where they are. Review your medicines with your doctor. Some medicines can make you feel dizzy. This can increase your chance of falling. Ask your doctor what other things that you can do to help prevent falls. This information is not intended to replace advice given to you by your health care provider. Make sure you discuss any questions you have with your health care provider. Document Released: 05/28/2009 Document Revised: 01/07/2016 Document Reviewed: 09/05/2014 Elsevier Interactive Patient Education  2017 [employer].

## 2024-04-30 NOTE — Progress Notes (Signed)
 Subjective:   Tracey Ayala is a 73 y.o. female who presents for Medicare Annual (Subsequent) preventive examination.  Visit Complete: Virtual I connected with  Tracey Ayala on 04/30/24 by a audio enabled telemedicine application and verified that I am speaking with the correct person using two identifiers.  Patient Location: Home  Provider Location: Home Office  I discussed the limitations of evaluation and management by telemedicine. The patient expressed understanding and agreed to proceed.  Vital Signs: Because this visit was a virtual/telehealth visit, some criteria may be missing or patient reported. Any vitals not documented were not able to be obtained and vitals that have been documented are patient reported.  Patient Medicare AWV questionnaire was completed by the patient on 04-27-2024; I have confirmed that all information answered by patient is correct and no changes since this date.  Cardiac Risk Factors include: advanced age (>50men, >86 women);hypertension     Objective:    Today's Vitals   04/30/24 1354  Weight: 108 lb (49 kg)  Height: 5' 2.5 (1.588 m)   Body mass index is 19.44 kg/m.     04/30/2024    2:10 PM 01/25/2023   11:09 AM 01/06/2022   11:34 AM 11/04/2021    4:06 PM 12/25/2020    4:15 PM 12/07/2020    9:17 AM 12/01/2020   10:25 AM  Advanced Directives  Does Patient Have a Medical Advance Directive? No Yes Yes No Yes Yes No  Type of Advance Directive  Healthcare Power of [employer] Power of Attorney      Does patient want to make changes to medical advance directive?      No - Patient declined   Copy of Healthcare Power of Attorney in Chart?  Yes - validated most recent copy scanned in chart (See row information) Yes - validated most recent copy scanned in chart (See row information)      Would patient like information on creating a medical advance directive? No - Patient declined   No - Patient declined  No - Patient declined Yes  (MAU/Ambulatory/Procedural Areas - Information given)    Current Medications (verified) Outpatient Encounter Medications as of 04/30/2024  Medication Sig   atorvastatin  (LIPITOR) 10 MG tablet TAKE 1 TABLET BY MOUTH EVERY DAY   azelastine (ASTELIN) 0.1 % nasal spray 1-2 sprays in each nostril Nasally Twice a day as needed; Duration: 30 day(s)   cetirizine (ZYRTEC) 10 MG tablet 1 tablet Orally Once a day; Duration: 30 days   Cholecalciferol (VITAMIN D ) 50 MCG (2000 UT) CAPS Take 2,000 Units by mouth daily. Vitamin d  3   citalopram  (CELEXA ) 20 MG tablet Take 1 tablet (20 mg total) by mouth every evening.   clonazePAM  (KLONOPIN ) 0.5 MG tablet Take 1 tablet (0.5 mg total) by mouth daily.   EPINEPHrine 0.3 mg/0.3 mL IJ SOAJ injection Inject 0.3 mg into the muscle as needed for anaphylaxis.   fexofenadine (ALLEGRA) 180 MG tablet 1 tablet Swallow whole with water; do not take with fruit juices. Orally Once a day, prn; Duration: 30 days   lisinopril  (ZESTRIL ) 10 MG tablet Take 1 tablet (10 mg total) by mouth daily.   mometasone  (ELOCON ) 0.1 % cream Apply topically daily as needed for itch   PRESCRIPTION MEDICATION Allergy Injections once a week.   No facility-administered encounter medications on file as of 04/30/2024.    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/2020   Hypercholesterolemia    Hypertension    Mitral valve prolapse 12/23/2021   Syncope and collapse 12/23/2021   Wears glasses    Past Surgical History:  Procedure Laterality Date   basal cell carcinoma from left shoulder  2018   mohs procedure  2014   right nostril   ovarian cystectomy and appendectomy Left 1978   ROBOTIC ASSISTED  SALPINGO OOPHERECTOMY Bilateral 12/07/2020   Procedure: XI ROBOTIC ASSISTED BILATERAL SALPINGO OOPHORECTOMY, MINI LAPAROTOMY, PELVIC WASHINGS;  Surgeon: Viktoria Comer SAUNDERS, MD;  Location: [facility] [place];  Service: Gynecology;  Laterality: Bilateral;   WISDOM TOOTH EXTRACTION  1974   Family History  Problem Relation Age of Onset   Stroke Mother    Hypertension Mother    Hyperlipidemia Mother    Diabetes Mother    Stroke Father    Early death Father    Hypertension Father    Hyperlipidemia Father    Arthritis Sister    Cancer Sister        carcinoid tumor of the appendix   Colon cancer Maternal Aunt    Breast cancer Maternal Aunt    Heart failure Maternal Grandmother    Stroke Maternal Grandmother    Endometrial cancer Neg Hx    Ovarian cancer Neg Hx    Pancreatic cancer Neg Hx    Prostate cancer Neg Hx    Social History   Socioeconomic History   Marital status: Widowed    Spouse name: Not on file   Number of children: Not on file   Years of education: Not on file   Highest education level: Bachelor's degree (e.g., BA, AB, BS)  Occupational History   Occupation: retired  Tobacco Use   Smoking status: Never   Smokeless tobacco: Never  Vaping Use   Vaping status: Never Used  Substance and Sexual Activity   Alcohol use: Never   Drug use: Never   Sexual activity: Not Currently  Other Topics Concern   Not on file  Social History Narrative   Not on file   Social Drivers of Health   Financial Resource Strain: Low Risk  (04/30/2024)   Overall Financial Resource Strain (CARDIA)    Difficulty of Paying Living Expenses: Not hard at all  Food Insecurity: No Food Insecurity (04/30/2024)   Hunger Vital Sign    Worried About Running Out of Food in the Last Year: Never true    Ran Out of Food in the Last Year: Never true  Transportation Needs: No Transportation Needs (04/30/2024)   PRAPARE - Administrator, Civil Service (Medical): No    Lack of  Transportation (Non-Medical): No  Physical Activity: Sufficiently Active (04/30/2024)   Exercise Vital Sign    Days of Exercise per Week: 5 days    Minutes of Exercise per Session: 50 min  Stress: No Stress Concern Present (04/30/2024)   [employer] of Occupational Health - Occupational Stress Questionnaire    Feeling of Stress: Only a little  Social Connections: Moderately Integrated (04/30/2024)   Social Connection and Isolation Panel    Frequency of Communication with Friends and Family: More than three times a week    Frequency of Social Gatherings with Friends and Family: More than  three times a week    Attends Religious Services: More than 4 times per year    Active Member of Clubs or Organizations: Yes    Attends Banker Meetings: More than 4 times per year    Marital Status: Widowed    Tobacco Counseling Counseling given: Not Answered   Clinical Intake:  Pre-visit preparation completed: Yes  Pain : No/denies pain     Diabetes: No  How often do you need to have someone help you when you read instructions, pamphlets, or other written materials from your doctor or pharmacy?: 1 - Never  Interpreter Needed?: No  Information entered by :: Mliss Graff LPN   Activities of Daily Living    04/30/2024    1:55 PM 04/27/2024    5:13 PM  In your present state of health, do you have any difficulty performing the following activities:  Hearing? 0 0  Vision? 0 0  Difficulty concentrating or making decisions? 0 0  Walking or climbing stairs? 0 0  Dressing or bathing? 0 0  Doing errands, shopping? 0 0  Preparing Food and eating ? N N  Using the Toilet? N N  In the past six months, have you accidently leaked urine? N N  Do you have problems with loss of bowel control? N N  Managing your Medications? N N  Managing your Finances? N N  Housekeeping or managing your Housekeeping? N N    Patient Care Team: Levora Reyes SAUNDERS, MD as PCP - General (Family  Medicine) Livingston Rigg, MD as Consulting Physician (Dermatology)  Indicate any recent Medical Services you may have received from other than [facility] providers in the past year (date may be approximate).     Assessment:   This is a routine wellness examination for Desree.  Hearing/Vision screen Hearing Screening - Comments:: No trouble hearing Vision Screening - Comments:: [place] Up to date   Goals Addressed             This Visit's Progress    Patient Stated   On track    Keep working on bone building exercise      Patient Stated   On track    Would like to exercise class like dancing Improve memory     Patient Stated       Continue current lifesty  with exercise Help others        Depression Screen    04/30/2024    1:56 PM 02/29/2024    9:57 AM 01/04/2024    8:45 AM 08/31/2023   10:18 AM 01/25/2023   11:12 AM 11/23/2022   11:57 AM 08/11/2022   10:11 AM  PHQ 2/9 Scores  PHQ - 2 Score 1 0 0 0 0 0 0  PHQ- 9 Score 1 0  0 0 1 1    Fall Risk    04/30/2024    1:52 PM 04/27/2024    5:13 PM 02/29/2024    9:57 AM 01/04/2024    8:45 AM 08/31/2023   10:18 AM  Fall Risk   Falls in the past year? 0 0 0 0 0  Number falls in past yr: 0  0 0 0  Injury with Fall? 0  0 0 0  Risk for fall due to :     No Fall Risks  Follow up Falls evaluation completed;Education provided;Falls prevention discussed   Falls evaluation completed Falls evaluation completed    MEDICARE RISK AT HOME: Medicare Risk at Home Any stairs in  or around the home?: No Home free of loose throw rugs in walkways, pet beds, electrical cords, etc?: Yes Adequate lighting in your home to reduce risk of falls?: Yes Life alert?: No Use of a cane, walker or w/c?: No Grab bars in the bathroom?: No Elevated toilet seat or a handicapped toilet?: No  TIMED UP AND GO:  Was the test performed?  No    Cognitive Function:        04/30/2024    1:55 PM 01/25/2023   11:06 AM 01/06/2022   11:35 AM  6CIT Screen  What  Year? 0 points 0 points 0 points  What month? 0 points 0 points 0 points  What time? 0 points 0 points 0 points  Count back from 20 0 points 0 points 0 points  Months in reverse 0 points 0 points 0 points  Repeat phrase 0 points 0 points 0 points  Total Score 0 points 0 points 0 points    Immunizations Immunization History  Administered Date(s) Administered   INFLUENZA, HIGH DOSE SEASONAL PF 06/22/2017, 06/28/2018, 06/04/2019, 06/24/2019, 06/23/2020   Influenza Split 06/25/2009, 05/18/2010, 05/23/2012, 06/13/2014, 05/18/2016   Influenza,inj,Quad PF,6+ Mos 05/22/2013   Influenza,inj,quad, With Preservative 06/01/2015   Pneumococcal Conjugate-13 02/01/2017   Pneumococcal Polysaccharide-23 06/22/2017, 04/05/2018, 06/28/2018, 06/23/2020   Td 04/05/2018   Tdap 11/06/2008, 08/03/2018   Zoster, Live 01/02/2012, 04/28/2018, 08/26/2018   Zoster, Unspecified 06/28/2022    TDAP status: Up to date  Flu Vaccine status: Due, Education has been provided regarding the importance of this vaccine. Advised may receive this vaccine at local pharmacy or Health Dept. Aware to provide a copy of the vaccination record if obtained from local pharmacy or Health Dept. Verbalized acceptance and understanding.  Pneumococcal vaccine status: Up to date  Covid-19 vaccine status: Declined, Education has been provided regarding the importance of this vaccine but patient still declined. Advised may receive this vaccine at local pharmacy or Health Dept.or vaccine clinic. Aware to provide a copy of the vaccination record if obtained from local pharmacy or Health Dept. Verbalized acceptance and understanding.  Qualifies for Shingles Vaccine? No   Zostavax completed Yes   Shingrix Completed?: Yes  Screening Tests Health Maintenance  Topic Date Due   Zoster Vaccines- Shingrix (1 of 2) 08/11/1970   Influenza Vaccine  03/15/2024   Medicare Annual Wellness (AWV)  04/30/2025   DTaP/Tdap/Td (4 - Td or Tdap) 08/03/2028    Colonoscopy  01/19/2030   Pneumococcal Vaccine: 50+ Years  Completed   DEXA SCAN  Completed   Hepatitis C Screening  Completed   HPV VACCINES  Aged Out   Meningococcal B Vaccine  Aged Out   Mammogram  Discontinued   COVID-19 Vaccine  Discontinued    Health Maintenance  Health Maintenance Due  Topic Date Due   Zoster Vaccines- Shingrix (1 of 2) 08/11/1970   Influenza Vaccine  03/15/2024    Colorectal cancer screening: Type of screening: Colonoscopy. Completed 2021. Repeat every 5 years  Mammogram status: Completed  . Repeat every year  Bone Density status: Completed 2024. Results reflect: Bone density results: OSTEOPOROSIS. Repeat every 0 years.  Lung Cancer Screening: (Low Dose CT Chest recommended if Age 18-80 years, 20 pack-year currently smoking OR have quit w/in 15years.) does not qualify.   Lung Cancer Screening Referral:   Additional Screening:  Hepatitis C Screening: does not qualify; Completed 2025  Vision Screening: Recommended annual ophthalmology exams for early detection of glaucoma and other disorders of the eye. Is the  patient up to date with their annual eye exam?  No  Who is the provider or what is the name of the office in which the patient attends annual eye exams? [place] If pt is not established with a provider, would they like to be referred to a provider to establish care? No .   Dental Screening: Recommended annual dental exams for proper oral hygiene  Community Resource Referral / Chronic Care Management: CRR required this visit?  No   CCM required this visit?  No     Plan:     I have personally reviewed and noted the following in the patient's chart:   Medical and social history Use of alcohol, tobacco or illicit drugs  Current medications and supplements including opioid prescriptions. Patient is not currently taking opioid prescriptions. Functional ability and status Nutritional status Physical activity Advanced directives List of  other physicians Hospitalizations, surgeries, and ER visits in previous 12 months Vitals Screenings to include cognitive, depression, and falls Referrals and appointments  In addition, I have reviewed and discussed with patient certain preventive protocols, quality metrics, and best practice recommendations. A written personalized care plan for preventive services as well as general preventive health recommendations were provided to patient.     Mliss Graff, LPN   [id number]   After Visit Summary: (MyChart) Due to this being a telephonic visit, the after visit summary with patients personalized plan was offered to patient via MyChart   Nurse Notes:

## 2024-05-02 DIAGNOSIS — J301 Allergic rhinitis due to pollen: Secondary | ICD-10-CM | POA: Diagnosis not present

## 2024-05-02 DIAGNOSIS — J3081 Allergic rhinitis due to animal (cat) (dog) hair and dander: Secondary | ICD-10-CM | POA: Diagnosis not present

## 2024-05-07 DIAGNOSIS — J3089 Other allergic rhinitis: Secondary | ICD-10-CM | POA: Diagnosis not present

## 2024-05-07 DIAGNOSIS — J301 Allergic rhinitis due to pollen: Secondary | ICD-10-CM | POA: Diagnosis not present

## 2024-05-07 DIAGNOSIS — J3081 Allergic rhinitis due to animal (cat) (dog) hair and dander: Secondary | ICD-10-CM | POA: Diagnosis not present

## 2024-05-14 DIAGNOSIS — J3089 Other allergic rhinitis: Secondary | ICD-10-CM | POA: Diagnosis not present

## 2024-05-14 DIAGNOSIS — J3081 Allergic rhinitis due to animal (cat) (dog) hair and dander: Secondary | ICD-10-CM | POA: Diagnosis not present

## 2024-05-14 DIAGNOSIS — J301 Allergic rhinitis due to pollen: Secondary | ICD-10-CM | POA: Diagnosis not present

## 2024-05-21 DIAGNOSIS — J3089 Other allergic rhinitis: Secondary | ICD-10-CM | POA: Diagnosis not present

## 2024-05-21 DIAGNOSIS — J3081 Allergic rhinitis due to animal (cat) (dog) hair and dander: Secondary | ICD-10-CM | POA: Diagnosis not present

## 2024-05-21 DIAGNOSIS — J301 Allergic rhinitis due to pollen: Secondary | ICD-10-CM | POA: Diagnosis not present

## 2024-05-28 DIAGNOSIS — J3089 Other allergic rhinitis: Secondary | ICD-10-CM | POA: Diagnosis not present

## 2024-05-28 DIAGNOSIS — J3081 Allergic rhinitis due to animal (cat) (dog) hair and dander: Secondary | ICD-10-CM | POA: Diagnosis not present

## 2024-05-28 DIAGNOSIS — J301 Allergic rhinitis due to pollen: Secondary | ICD-10-CM | POA: Diagnosis not present

## 2024-05-30 NOTE — Progress Notes (Signed)
 Tracey Ayala                                          MRN: [id number]   05/30/2024   The [facility] Quality Team Specialist reviewed this patient medical record for the purposes of chart review for care gap closure. The following were reviewed: chart review for care gap closure-controlling blood pressure.    [facility]BCI Quality Team

## 2024-06-04 DIAGNOSIS — J3081 Allergic rhinitis due to animal (cat) (dog) hair and dander: Secondary | ICD-10-CM | POA: Diagnosis not present

## 2024-06-04 DIAGNOSIS — J301 Allergic rhinitis due to pollen: Secondary | ICD-10-CM | POA: Diagnosis not present

## 2024-06-04 DIAGNOSIS — J3089 Other allergic rhinitis: Secondary | ICD-10-CM | POA: Diagnosis not present

## 2024-06-11 DIAGNOSIS — J3081 Allergic rhinitis due to animal (cat) (dog) hair and dander: Secondary | ICD-10-CM | POA: Diagnosis not present

## 2024-06-11 DIAGNOSIS — J301 Allergic rhinitis due to pollen: Secondary | ICD-10-CM | POA: Diagnosis not present

## 2024-06-11 DIAGNOSIS — J3089 Other allergic rhinitis: Secondary | ICD-10-CM | POA: Diagnosis not present

## 2024-06-18 DIAGNOSIS — J301 Allergic rhinitis due to pollen: Secondary | ICD-10-CM | POA: Diagnosis not present

## 2024-06-18 DIAGNOSIS — J3089 Other allergic rhinitis: Secondary | ICD-10-CM | POA: Diagnosis not present

## 2024-06-18 DIAGNOSIS — J3081 Allergic rhinitis due to animal (cat) (dog) hair and dander: Secondary | ICD-10-CM | POA: Diagnosis not present

## 2024-06-20 DIAGNOSIS — D225 Melanocytic nevi of trunk: Secondary | ICD-10-CM | POA: Diagnosis not present

## 2024-06-20 DIAGNOSIS — Z85828 Personal history of other malignant neoplasm of skin: Secondary | ICD-10-CM | POA: Diagnosis not present

## 2024-06-20 DIAGNOSIS — L821 Other seborrheic keratosis: Secondary | ICD-10-CM | POA: Diagnosis not present

## 2024-06-20 DIAGNOSIS — L570 Actinic keratosis: Secondary | ICD-10-CM | POA: Diagnosis not present

## 2024-06-20 DIAGNOSIS — D485 Neoplasm of uncertain behavior of skin: Secondary | ICD-10-CM | POA: Diagnosis not present

## 2024-06-20 DIAGNOSIS — L820 Inflamed seborrheic keratosis: Secondary | ICD-10-CM | POA: Diagnosis not present

## 2024-06-25 DIAGNOSIS — J3089 Other allergic rhinitis: Secondary | ICD-10-CM | POA: Diagnosis not present

## 2024-06-25 DIAGNOSIS — J3081 Allergic rhinitis due to animal (cat) (dog) hair and dander: Secondary | ICD-10-CM | POA: Diagnosis not present

## 2024-06-25 DIAGNOSIS — J301 Allergic rhinitis due to pollen: Secondary | ICD-10-CM | POA: Diagnosis not present

## 2024-06-28 NOTE — Progress Notes (Signed)
 EILISH MCDANIEL                                          MRN: [id number]   06/28/2024   The [facility]BCI Quality Team Specialist reviewed this patient medical record for the purposes of chart review for care gap closure. The following were reviewed: chart review for care gap closure-controlling blood pressure.    [facility]BCI Quality Team

## 2024-07-02 DIAGNOSIS — J3089 Other allergic rhinitis: Secondary | ICD-10-CM | POA: Diagnosis not present

## 2024-07-02 DIAGNOSIS — J3081 Allergic rhinitis due to animal (cat) (dog) hair and dander: Secondary | ICD-10-CM | POA: Diagnosis not present

## 2024-07-02 DIAGNOSIS — J301 Allergic rhinitis due to pollen: Secondary | ICD-10-CM | POA: Diagnosis not present

## 2024-07-09 DIAGNOSIS — J301 Allergic rhinitis due to pollen: Secondary | ICD-10-CM | POA: Diagnosis not present

## 2024-07-09 DIAGNOSIS — J3089 Other allergic rhinitis: Secondary | ICD-10-CM | POA: Diagnosis not present

## 2024-07-09 DIAGNOSIS — J3081 Allergic rhinitis due to animal (cat) (dog) hair and dander: Secondary | ICD-10-CM | POA: Diagnosis not present

## 2024-07-14 ENCOUNTER — Other Ambulatory Visit: Payer: Self-pay | Admitting: Family Medicine

## 2024-07-14 DIAGNOSIS — F419 Anxiety disorder, unspecified: Secondary | ICD-10-CM

## 2024-07-14 DIAGNOSIS — G4700 Insomnia, unspecified: Secondary | ICD-10-CM

## 2024-07-16 DIAGNOSIS — J3089 Other allergic rhinitis: Secondary | ICD-10-CM | POA: Diagnosis not present

## 2024-07-16 DIAGNOSIS — J3081 Allergic rhinitis due to animal (cat) (dog) hair and dander: Secondary | ICD-10-CM | POA: Diagnosis not present

## 2024-07-16 DIAGNOSIS — J301 Allergic rhinitis due to pollen: Secondary | ICD-10-CM | POA: Diagnosis not present

## 2024-07-16 NOTE — Telephone Encounter (Signed)
 Medication discussed at July visit.  Controlled substance database (PDMP) reviewed. No concerns appreciated. Last filled #30 on 05/18/24, #30 on 02/29/24. Refill ordered.

## 2024-07-16 NOTE — Telephone Encounter (Signed)
 Requested Prescriptions   Pending Prescriptions Disp Refills   clonazePAM  (KLONOPIN ) 0.5 MG tablet [Pharmacy Med Name: CLONAZEPAM  0.5 MG TABLET] 30 tablet 1    Sig: TAKE 1 TABLET BY MOUTH EVERY DAY     Date of patient request: 07/16/24 Last office visit: 02/29/2024 Upcoming visit: 09/19/2024 Date of last refill: 02/29/24 Last refill amount: 30

## 2024-07-23 DIAGNOSIS — J301 Allergic rhinitis due to pollen: Secondary | ICD-10-CM | POA: Diagnosis not present

## 2024-07-23 DIAGNOSIS — J3081 Allergic rhinitis due to animal (cat) (dog) hair and dander: Secondary | ICD-10-CM | POA: Diagnosis not present

## 2024-07-23 DIAGNOSIS — J3089 Other allergic rhinitis: Secondary | ICD-10-CM | POA: Diagnosis not present

## 2024-07-26 NOTE — Progress Notes (Signed)
 Tracey Ayala                                          MRN: [id number]   07/26/2024   The [facility] Quality Team Specialist reviewed this patient medical record for the purposes of chart review for care gap closure. The following were reviewed: chart review for care gap closure-controlling blood pressure.    [facility]BCI Quality Team

## 2024-07-30 DIAGNOSIS — J3081 Allergic rhinitis due to animal (cat) (dog) hair and dander: Secondary | ICD-10-CM | POA: Diagnosis not present

## 2024-07-30 DIAGNOSIS — J301 Allergic rhinitis due to pollen: Secondary | ICD-10-CM | POA: Diagnosis not present

## 2024-07-30 DIAGNOSIS — J3089 Other allergic rhinitis: Secondary | ICD-10-CM | POA: Diagnosis not present

## 2024-08-03 ENCOUNTER — Other Ambulatory Visit: Payer: Self-pay | Admitting: Family Medicine

## 2024-08-03 DIAGNOSIS — F419 Anxiety disorder, unspecified: Secondary | ICD-10-CM

## 2024-08-06 DIAGNOSIS — J3089 Other allergic rhinitis: Secondary | ICD-10-CM | POA: Diagnosis not present

## 2024-08-06 DIAGNOSIS — J301 Allergic rhinitis due to pollen: Secondary | ICD-10-CM | POA: Diagnosis not present

## 2024-08-06 DIAGNOSIS — J3081 Allergic rhinitis due to animal (cat) (dog) hair and dander: Secondary | ICD-10-CM | POA: Diagnosis not present

## 2024-09-04 NOTE — Progress Notes (Signed)
 Tracey Ayala                                          MRN: [id number]   09/04/2024   The [facility]BCI Quality Team Specialist reviewed this patient medical record for the purposes of chart review for care gap closure. The following were reviewed: chart review for care gap closure-controlling blood pressure.    [facility]BCI Quality Team

## 2024-09-19 ENCOUNTER — Encounter: Payer: Self-pay | Admitting: Family Medicine

## 2024-09-19 ENCOUNTER — Ambulatory Visit: Admitting: Family Medicine

## 2024-09-19 VITALS — BP 102/66 | HR 75 | Temp 98.8°F | Resp 12 | Ht 62.5 in | Wt 111.4 lb

## 2024-09-19 DIAGNOSIS — Z1211 Encounter for screening for malignant neoplasm of colon: Secondary | ICD-10-CM

## 2024-09-19 DIAGNOSIS — Z0000 Encounter for general adult medical examination without abnormal findings: Secondary | ICD-10-CM

## 2024-09-19 DIAGNOSIS — I10 Essential (primary) hypertension: Secondary | ICD-10-CM

## 2024-09-19 DIAGNOSIS — E785 Hyperlipidemia, unspecified: Secondary | ICD-10-CM | POA: Insufficient documentation

## 2024-09-19 DIAGNOSIS — F411 Generalized anxiety disorder: Secondary | ICD-10-CM | POA: Insufficient documentation

## 2024-09-19 DIAGNOSIS — F3340 Major depressive disorder, recurrent, in remission, unspecified: Secondary | ICD-10-CM | POA: Insufficient documentation

## 2024-09-19 DIAGNOSIS — M810 Age-related osteoporosis without current pathological fracture: Secondary | ICD-10-CM | POA: Insufficient documentation

## 2024-09-19 DIAGNOSIS — R7303 Prediabetes: Secondary | ICD-10-CM

## 2024-09-19 DIAGNOSIS — Z860100 Personal history of colon polyps, unspecified: Secondary | ICD-10-CM | POA: Insufficient documentation

## 2024-09-19 DIAGNOSIS — Z1329 Encounter for screening for other suspected endocrine disorder: Secondary | ICD-10-CM

## 2024-09-19 DIAGNOSIS — G4700 Insomnia, unspecified: Secondary | ICD-10-CM | POA: Insufficient documentation

## 2024-09-19 DIAGNOSIS — F419 Anxiety disorder, unspecified: Secondary | ICD-10-CM | POA: Insufficient documentation

## 2024-09-19 DIAGNOSIS — F339 Major depressive disorder, recurrent, unspecified: Secondary | ICD-10-CM | POA: Insufficient documentation

## 2024-09-19 LAB — CBC WITH DIFFERENTIAL/PLATELET
Basophils Absolute: 0 10*3/uL (ref 0.0–0.1)
Basophils Relative: 1 % (ref 0.0–3.0)
Eosinophils Absolute: 0.1 10*3/uL (ref 0.0–0.7)
Eosinophils Relative: 2.3 % (ref 0.0–5.0)
HCT: 40.7 % (ref 36.0–46.0)
Hemoglobin: 13.8 g/dL (ref 12.0–15.0)
Lymphocytes Relative: 31.4 % (ref 12.0–46.0)
Lymphs Abs: 1.3 10*3/uL (ref 0.7–4.0)
MCHC: 33.8 g/dL (ref 30.0–36.0)
MCV: 87.6 fl (ref 78.0–100.0)
Monocytes Absolute: 0.4 10*3/uL (ref 0.1–1.0)
Monocytes Relative: 10.5 % (ref 3.0–12.0)
Neutro Abs: 2.3 10*3/uL (ref 1.4–7.7)
Neutrophils Relative %: 54.8 % (ref 43.0–77.0)
Platelets: 177 10*3/uL (ref 150.0–400.0)
RBC: 4.65 Mil/uL (ref 3.87–5.11)
RDW: 13.1 % (ref 11.5–15.5)
WBC: 4.1 10*3/uL (ref 4.0–10.5)

## 2024-09-19 LAB — TSH: TSH: 3.37 u[IU]/mL (ref 0.35–5.50)

## 2024-09-19 LAB — COMPREHENSIVE METABOLIC PANEL WITH GFR
ALT: 19 U/L (ref 3–35)
AST: 25 U/L (ref 5–37)
Albumin: 4.3 g/dL (ref 3.5–5.2)
Alkaline Phosphatase: 60 U/L (ref 39–117)
BUN: 11 mg/dL (ref 6–23)
CO2: 36 meq/L — ABNORMAL HIGH (ref 19–32)
Calcium: 9.4 mg/dL (ref 8.4–10.5)
Chloride: 103 meq/L (ref 96–112)
Creatinine, Ser: 0.65 mg/dL (ref 0.40–1.20)
GFR: 87.54 mL/min
Glucose, Bld: 96 mg/dL (ref 70–99)
Potassium: 4 meq/L (ref 3.5–5.1)
Sodium: 143 meq/L (ref 135–145)
Total Bilirubin: 0.7 mg/dL (ref 0.2–1.2)
Total Protein: 6.1 g/dL (ref 6.0–8.3)

## 2024-09-19 LAB — HEMOGLOBIN A1C: Hgb A1c MFr Bld: 5.7 % (ref 4.6–6.5)

## 2024-09-19 MED ORDER — ATORVASTATIN CALCIUM 10 MG PO TABS: ORAL_TABLET | ORAL | 3 refills | Status: AC

## 2024-09-19 MED ORDER — CLONAZEPAM 0.5 MG PO TABS: 0.5000 mg | ORAL_TABLET | Freq: Every day | ORAL | 1 refills | Status: AC

## 2024-09-19 MED ORDER — LISINOPRIL 10 MG PO TABS: 10.0000 mg | ORAL_TABLET | Freq: Every day | ORAL | 3 refills | Status: AC

## 2024-09-19 NOTE — Telephone Encounter (Signed)
 Patient requesting to change imaging location for Bone density

## 2024-09-19 NOTE — Progress Notes (Signed)
 "  Subjective:  Patient ID: Tracey Ayala, female    DOB: 1951/03/23  Age: 74 y.o. MRN: [id number]  CC:  Chief Complaint  Patient presents with   Annual Exam    No concerns today. Patient is supposed have a colonoscopy this year, it been 5 years    HPI Tracey Ayala presents for Annual Exam, no acute concerns.   PCP, me Allergist, Dr. Altamease, allergic rhinitis ENT, Dr. Karis, chronic eczematous otitis externa, impacted cerumen previously. Dermatology, Dr. Lynnell, appointment May 2025 Oncology, Dr. Romero at [facility], following liver mass, normal CEA and CA 19-9 had decreased from 2 56-41 discussed in July.  Thought to be cysts that would not become malignant but yearly monitoring with MRI planned.  Last MRI 09/29/2023. Planned appt this month with oncology - she decided against follow up at this time.  Gastroenterology, Margarete, plan for repeat colonoscopy this year, last one in 2021 - planned 5 year repeat.  Gynecology, Dr. Dannielle - appt last year, yearly visits.  Optometry, Dr. Oman  - appt in March.  Urology, Dr. McDiarmid. No recent visit, or follow up planned.   Prediabetes: Diet/exercise approach.  Minimal change in weight - stable at 108 at home.  Lab Results  Component Value Date   HGBA1C 5.9 02/29/2024   Wt Readings from Last 3 Encounters:  09/19/24 111 lb 6.4 oz (50.5 kg)  04/30/24 108 lb (49 kg)  03/18/24 108 lb (49 kg)   Hypertension: Lisinopril  10 mg daily.  No new side effects Home readings: none BP Readings from Last 3 Encounters:  09/19/24 102/66  03/18/24 (!) 143/79  02/29/24 114/70   Lab Results  Component Value Date   CREATININE 0.66 02/29/2024   Hyperlipidemia: Lipitor 10 mg daily without new myalgias/arthralgias.  Lab Results  Component Value Date   CHOL 188 02/29/2024   HDL 87.20 02/29/2024   LDLCALC 88 02/29/2024   TRIG 67.0 02/29/2024   CHOLHDL 2 02/29/2024   Lab Results  Component Value Date   ALT 23 02/29/2024   AST 30  02/29/2024   ALKPHOS 53 02/29/2024   BILITOT 0.8 02/29/2024   Anxiety with insomnia Celexa  20 mg daily, stable dose for years.  Has required half dose of Klonopin  most nights that has been effective.  Ineffective other approaches including trial of melatonin and other over-the-counter treatments.  Denies any falls dizziness parasomnias or new side effects of current dose of clonazepam , risks have been discussed. No falls. Controlled substance database reviewed.  #30 last filled on 07/19/2024.       09/19/2024    9:47 AM 04/30/2024    1:56 PM 02/29/2024    9:57 AM 01/04/2024    8:45 AM 08/31/2023   10:18 AM  Depression screen PHQ 2/9  Decreased Interest 0 0 0 0 0  Down, Depressed, Hopeless 0 1 0 0 0  PHQ - 2 Score 0 1 0 0 0  Altered sleeping 0 0 0  0  Tired, decreased energy 0 0 0  0  Change in appetite 0 0 0  0  Feeling bad or failure about yourself  0  0  0  Trouble concentrating 0 0 0  0  Moving slowly or fidgety/restless 0 0 0  0  Suicidal thoughts 0 0 0  0  PHQ-9 Score 0 1  0   0   Difficult doing work/chores Not difficult at all Not difficult at all Not difficult at all  Data saved with a previous flowsheet row definition  Colonoscopy planned with Margarete this year after June.  Followed by GYN.   Osteoporosis on last bone density in 2024. Had been on meds for 7 years then stopped. Tried prolia - 2 injections. Concerned about bone loss, other risks, no recent meds. Planned updated testing this year.    Health Maintenance  Topic Date Due   Influenza Vaccine  11/12/2024 (Originally 03/15/2024)   Zoster Vaccines- Shingrix (1 of 2) 12/17/2024 (Originally 08/11/1970)   Medicare Annual Wellness (AWV)  04/30/2025   Mammogram  03/08/2026   DTaP/Tdap/Td (4 - Td or Tdap) 08/03/2028   Colonoscopy  01/19/2030   Pneumococcal Vaccine: 50+ Years  Completed   Bone Density Scan  Completed   Hepatitis C Screening  Completed   Meningococcal B Vaccine  Aged Out   COVID-19 Vaccine   Discontinued    Immunization History  Administered Date(s) Administered   Fluzone Influenza virus vaccine,trivalent (IIV3), split virus 06/25/2009, 05/23/2012, 06/13/2014, 05/18/2016   INFLUENZA, HIGH DOSE SEASONAL PF 06/22/2017, 06/28/2018, 06/04/2019, 06/24/2019, 06/23/2020   Influenza Split 05/18/2010   Influenza,inj,Quad PF,6+ Mos 05/22/2013   Influenza,inj,quad, With Preservative 06/01/2015   Influenza-Unspecified 05/22/2013   Pneumococcal Conjugate-13 02/01/2017   Pneumococcal Polysaccharide-23 06/22/2017, 04/05/2018, 06/28/2018, 06/23/2020   Td 04/05/2018   Tdap 11/06/2008, 08/03/2018   Zoster, Live 01/02/2012, 04/28/2018, 08/26/2018   Zoster, Unspecified 06/28/2022  Flu vaccine - declines.  Shingrix - prior injections above - [facility] 2 [facility]   No results found. Appt coming up soon with Dr. Oman.   Dental:Within Last 6 months, Dr. Marvis and planned visit with periodontist.   Alcohol: none  Tobacco: none  Exercise: regular walking 2 miles per day or at gym, with weightbearing exercises.  On CA and vit D supplement.    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   Syncope and collapse 12/23/2021   Mitral valve prolapse 12/23/2021   Essential hypertension 12/23/2021   Endometriosis    Allergic rhinitis 12/02/2020   Allergic rhinitis due to animal (cat) (dog) hair and dander 12/02/2020   Chronic allergic  conjunctivitis 12/02/2020   Past Medical History:  Diagnosis Date   Adnexal mass    Allergy    Receives injections once a week   Anxiety    Basal cell carcinoma 03/04/2013   bcc right nostril =mohs   Cataract    Endometriosis    Essential hypertension 12/23/2021   Fibroma of right ovary 12/15/2020   Hypercholesterolemia    Hypertension    Mitral valve prolapse 12/23/2021   Osteoporosis 2011   osteoporosis   Syncope and collapse 12/23/2021   noted in visit to ER   Wears glasses    Past Surgical History:  Procedure Laterality Date   ABDOMINAL HYSTERECTOMY  12/07/20   partial only - tubes & ovaries removed due to large benign fibroid tumor   APPENDECTOMY  1978   removed when ovarian cyst was removed   basal  cell carcinoma from left shoulder  2018   EYE SURGERY  June 2022   cataracts   mohs procedure  2014   right nostril   ovarian cystectomy and appendectomy Left 1978   ROBOTIC ASSISTED SALPINGO OOPHERECTOMY Bilateral 12/07/2020   Procedure: XI ROBOTIC ASSISTED BILATERAL SALPINGO OOPHORECTOMY, MINI LAPAROTOMY, PELVIC WASHINGS;  Surgeon: Viktoria Comer SAUNDERS, MD;  Location: [facility] [place];  Service: Gynecology;  Laterality: Bilateral;   WISDOM TOOTH EXTRACTION  1974   Allergies[1] Prior to Admission medications  Medication Sig Start Date End Date Taking? Authorizing Provider  atorvastatin  (LIPITOR) 10 MG tablet TAKE 1 TABLET BY MOUTH EVERY DAY 08/31/23  Yes Levora Reyes SAUNDERS, MD  azelastine (ASTELIN) 0.1 % nasal spray 1-2 sprays in each nostril Nasally Twice a day as needed; Duration: 30 day(s)   Yes [provider]  cetirizine (ZYRTEC) 10 MG tablet 1 tablet Orally Once a day; Duration: 30 days 06/21/22  Yes [provider]  Cholecalciferol (VITAMIN D ) 50 MCG (2000 UT) CAPS Take 2,000 Units by mouth daily. Vitamin d  3   Yes [provider]  citalopram  (CELEXA ) 20 MG tablet TAKE 1 TABLET BY MOUTH EVERY DAY IN THE EVENING 08/05/24  Yes  Levora Reyes SAUNDERS, MD  clonazePAM  (KLONOPIN ) 0.5 MG tablet TAKE 1 TABLET BY MOUTH EVERY DAY 07/16/24  Yes Levora Reyes SAUNDERS, MD  EPINEPHrine 0.3 mg/0.3 mL IJ SOAJ injection Inject 0.3 mg into the muscle as needed for anaphylaxis.   Yes [provider]  fexofenadine (ALLEGRA) 180 MG tablet 1 tablet Swallow whole with water; do not take with fruit juices. Orally Once a day, prn; Duration: 30 days   Yes [provider]  lisinopril  (ZESTRIL ) 10 MG tablet Take 1 tablet (10 mg total) by mouth daily. 08/31/23  Yes Levora Reyes SAUNDERS, MD  mometasone  (ELOCON ) 0.1 % cream Apply topically daily as needed for itch 03/18/24  Yes Karis Clunes, MD  PRESCRIPTION MEDICATION Allergy Injections once a week.   Yes [provider]   Social History   Socioeconomic History   Marital status: Widowed    Spouse name: Not on file   Number of children: Not on file   Years of education: Not on file   Highest education level: Bachelor's degree (e.g., BA, AB, BS)  Occupational History   Occupation: retired  Tobacco Use   Smoking status: Never   Smokeless tobacco: Never  Vaping Use   Vaping status: Never Used  Substance and Sexual Activity   Alcohol use: Never   Drug use: Never   Sexual activity: Not Currently    Birth control/protection: None  Other Topics Concern   Not on file  Social History Narrative   Not on file   Social Drivers of Health   Tobacco Use: Low Risk (09/19/2024)   Patient History    Smoking Tobacco Use: Never    Smokeless Tobacco Use: Never    Passive Exposure: Not on file  Financial Resource Strain: Low Risk (09/14/2024)   Overall Financial Resource Strain (CARDIA)    Difficulty of Paying Living Expenses: Not hard at all  Food Insecurity: No Food Insecurity (09/14/2024)   Epic    Worried About Radiation Protection Practitioner of Food in the Last Year: Never true    Ran Out of Food in the Last Year: Never true  Transportation Needs: No Transportation Needs (09/14/2024)   Epic    Lack of  Transportation (Medical): No    Lack of Transportation (Non-Medical): No  Physical Activity:  Sufficiently Active (04/30/2024)   Exercise Vital Sign    Days of Exercise per Week: 5 days    Minutes of Exercise per Session: 50 min  Stress: No Stress Concern Present (09/14/2024)   [employer] of Occupational Health - Occupational Stress Questionnaire    Feeling of Stress: Only a little  Social Connections: Unknown (09/14/2024)   Social Connection and Isolation Panel    Frequency of Communication with Friends and Family: More than three times a week    Frequency of Social Gatherings with Friends and Family: More than three times a week    Attends Religious Services: Patient declined    Database Administrator or Organizations: Patient declined    Attends Banker Meetings: Not on file    Marital Status: Widowed  Intimate Partner Violence: Not At Risk (04/30/2024)   Epic    Fear of Current or Ex-Partner: No    Emotionally Abused: No    Physically Abused: No    Sexually Abused: No  Depression (PHQ2-9): Low Risk (09/19/2024)   Depression (PHQ2-9)    PHQ-2 Score: 0  Alcohol Screen: Low Risk (04/30/2024)   Alcohol Screen    Last Alcohol Screening Score (AUDIT): 0  Housing: Low Risk (09/14/2024)   Epic    Unable to Pay for Housing in the Last Year: No    Number of Times Moved in the Last Year: 0    Homeless in the Last Year: No  Utilities: Not At Risk (04/30/2024)   Epic    Threatened with loss of utilities: No  Health Literacy: Adequate Health Literacy (04/30/2024)   B1300 Health Literacy    Frequency of need for help with medical instructions: Never    Review of Systems 13 point review of systems per patient health survey noted.  Negative other than as indicated above or in HPI.    Objective:   Vitals:   09/19/24 0942  BP: 102/66  Pulse: 75  Resp: 12  Temp: 98.8 F (37.1 C)  TempSrc: Temporal  SpO2: 98%  Weight: 111 lb 6.4 oz (50.5 kg)  Height: 5' 2.5 (1.588  m)     Physical Exam Constitutional:      Appearance: She is well-developed.  HENT:     Head: Normocephalic and atraumatic.     Right Ear: External ear normal.     Left Ear: External ear normal.  Eyes:     Conjunctiva/sclera: Conjunctivae normal.     Pupils: Pupils are equal, round, and reactive to light.  Neck:     Thyroid: No thyromegaly.  Cardiovascular:     Rate and Rhythm: Normal rate and regular rhythm.     Heart sounds: Normal heart sounds. No murmur heard. Pulmonary:     Effort: Pulmonary effort is normal. No respiratory distress.     Breath sounds: Normal breath sounds. No wheezing.  Abdominal:     General: Bowel sounds are normal.     Palpations: Abdomen is soft.     Tenderness: There is no abdominal tenderness.  Musculoskeletal:        General: No tenderness. Normal range of motion.     Cervical back: Normal range of motion and neck supple.  Lymphadenopathy:     Cervical: No cervical adenopathy.  Skin:    General: Skin is warm and dry.     Findings: No rash.  Neurological:     Mental Status: She is alert and oriented to person, place, and time.  Psychiatric:  Behavior: Behavior normal.        Thought Content: Thought content normal.        Assessment & Plan:  Tracey Ayala is a 74 y.o. female . Annual physical exam  Colon cancer screening - Plan: Ambulatory referral to Gastroenterology  Anxiety - Plan: clonazePAM  (KLONOPIN ) 0.5 MG tablet  Insomnia, unspecified type - Plan: clonazePAM  (KLONOPIN ) 0.5 MG tablet  Essential hypertension - Plan: CBC with Differential/Platelet, lisinopril  (ZESTRIL ) 10 MG tablet, TSH  Hyperlipidemia, unspecified hyperlipidemia type - Plan: Comprehensive metabolic panel with GFR, atorvastatin  (LIPITOR) 10 MG tablet  Osteoporosis without current pathological fracture, unspecified osteoporosis type - Plan: DG Bone Density  Prediabetes - Plan: Hemoglobin A1c  Screening for thyroid disorder - Plan: TSH   Meds  ordered this encounter  Medications   clonazePAM  (KLONOPIN ) 0.5 MG tablet    Sig: Take 1 tablet (0.5 mg total) by mouth daily.    Dispense:  30 tablet    Refill:  1    Not to exceed 4 additional fills before 08/27/2024 DX Code Needed  .   lisinopril  (ZESTRIL ) 10 MG tablet    Sig: Take 1 tablet (10 mg total) by mouth daily.    Dispense:  90 tablet    Refill:  3   atorvastatin  (LIPITOR) 10 MG tablet    Sig: TAKE 1 TABLET BY MOUTH EVERY DAY    Dispense:  90 tablet    Refill:  3   Patient Instructions  Thank you for coming in today. No change in medications at this time, but keep an eye on your blood pressure at home.  If you start having lower readings we can certainly decrease the lisinopril  to 5 mg or half pill per day.  Keep me posted.  I will order the updated bone density testing and then depending on the results can consider meeting with rheumatology or endocrinology to discuss treatment options based on what you have taken previously.  You can also discuss plan with OB/GYN at your next visit as well if you would prefer.  It appears that your colonoscopy is due anytime after June.  I suspect their office will be calling you but let me know if I can help.  If there are any concerns on your bloodwork, I will let you know. Take care!  Health Maintenance After Age 66 After age 6, you are at a higher risk for certain long-term diseases and infections as well as injuries from falls. Falls are a major cause of broken bones and head injuries in people who are older than age 5. Getting regular preventive care can help to keep you healthy and well. Preventive care includes getting regular testing and making lifestyle changes as recommended by your health care provider. Talk with your health care provider about: Which screenings and tests you should have. A screening is a test that checks for a disease when you have no symptoms. A diet and exercise plan that is right for you. What should I know  about screenings and tests to prevent falls? Screening and testing are the best ways to find a health problem early. Early diagnosis and treatment give you the best chance of managing medical conditions that are common after age 97. Certain conditions and lifestyle choices may make you more likely to have a fall. Your health care provider may recommend: Regular vision checks. Poor vision and conditions such as cataracts can make you more likely to have a fall. If you wear glasses, make sure  to get your prescription updated if your vision changes. Medicine review. Work with your health care provider to regularly review all of the medicines you are taking, including over-the-counter medicines. Ask your health care provider about any side effects that may make you more likely to have a fall. Tell your health care provider if any medicines that you take make you feel dizzy or sleepy. Strength and balance checks. Your health care provider may recommend certain tests to check your strength and balance while standing, walking, or changing positions. Foot health exam. Foot pain and numbness, as well as not wearing proper footwear, can make you more likely to have a fall. Screenings, including: Osteoporosis screening. Osteoporosis is a condition that causes the bones to get weaker and break more easily. Blood pressure screening. Blood pressure changes and medicines to control blood pressure can make you feel dizzy. Depression screening. You may be more likely to have a fall if you have a fear of falling, feel depressed, or feel unable to do activities that you used to do. Alcohol use screening. Using too much alcohol can affect your balance and may make you more likely to have a fall. Follow these instructions at home: Lifestyle Do not drink alcohol if: Your health care provider tells you not to drink. If you drink alcohol: Limit how much you have to: 0-1 drink a day for women. 0-2 drinks a day for  men. Know how much alcohol is in your drink. In the [place] one drink equals one 12 oz bottle of beer (355 mL), one 5 oz glass of wine (148 mL), or one 1 oz glass of hard liquor (44 mL). Do not use any products that contain nicotine or tobacco. These products include cigarettes, chewing tobacco, and vaping devices, such as e-cigarettes. If you need help quitting, ask your health care provider. Activity  Follow a regular exercise program to stay fit. This will help you maintain your balance. Ask your health care provider what types of exercise are appropriate for you. If you need a cane or walker, use it as recommended by your health care provider. Wear supportive shoes that have nonskid soles. Safety  Remove any tripping hazards, such as rugs, cords, and clutter. Install safety equipment such as grab bars in bathrooms and safety rails on stairs. Keep rooms and walkways well-lit. General instructions Talk with your health care provider about your risks for falling. Tell your health care provider if: You fall. Be sure to tell your health care provider about all falls, even ones that seem minor. You feel dizzy, tiredness (fatigue), or off-balance. Take over-the-counter and prescription medicines only as told by your health care provider. These include supplements. Eat a healthy diet and maintain a healthy weight. A healthy diet includes low-fat dairy products, low-fat (lean) meats, and fiber from whole grains, beans, and lots of fruits and vegetables. Stay current with your vaccines. Schedule regular health, dental, and eye exams. Summary Having a healthy lifestyle and getting preventive care can help to protect your health and wellness after age 61. Screening and testing are the best way to find a health problem early and help you avoid having a fall. Early diagnosis and treatment give you the best chance for managing medical conditions that are more common for people who are older than age  21. Falls are a major cause of broken bones and head injuries in people who are older than age 39. Take precautions to prevent a fall at home. Work with your health  care provider to learn what changes you can make to improve your health and wellness and to prevent falls. This information is not intended to replace advice given to you by your health care provider. Make sure you discuss any questions you have with your health care provider. Document Revised: 12/21/2020 Document Reviewed: 12/21/2020 Elsevier Patient Education  2024 Elsevier Inc.     Signed,   Reyes Pines, MD [facility], [facility] 09/19/24 10:40 AM      [1]  Allergies Allergen Reactions   Codeine Nausea Only, Nausea And Vomiting and Other (See Comments)    Sick   Other reaction(s): GI upset nausea  Sick   Other reaction(s): GI upset nausea  Sick   Other reaction(s): GI upset nausea  Sick   Other reaction(s): GI upset nausea   Fosamax [Alendronate] Rash   "

## 2024-09-19 NOTE — Patient Instructions (Signed)
 Thank you for coming in today. No change in medications at this time, but keep an eye on your blood pressure at home.  If you start having lower readings we can certainly decrease the lisinopril  to 5 mg or half pill per day.  Keep me posted.  I will order the updated bone density testing and then depending on the results can consider meeting with rheumatology or endocrinology to discuss treatment options based on what you have taken previously.  You can also discuss plan with OB/GYN at your next visit as well if you would prefer.  It appears that your colonoscopy is due anytime after June.  I suspect their office will be calling you but let me know if I can help.  If there are any concerns on your bloodwork, I will let you know. Take care!  Health Maintenance After Age 47 After age 85, you are at a higher risk for certain long-term diseases and infections as well as injuries from falls. Falls are a major cause of broken bones and head injuries in people who are older than age 57. Getting regular preventive care can help to keep you healthy and well. Preventive care includes getting regular testing and making lifestyle changes as recommended by your health care provider. Talk with your health care provider about: Which screenings and tests you should have. A screening is a test that checks for a disease when you have no symptoms. A diet and exercise plan that is right for you. What should I know about screenings and tests to prevent falls? Screening and testing are the best ways to find a health problem early. Early diagnosis and treatment give you the best chance of managing medical conditions that are common after age 76. Certain conditions and lifestyle choices may make you more likely to have a fall. Your health care provider may recommend: Regular vision checks. Poor vision and conditions such as cataracts can make you more likely to have a fall. If you wear glasses, make sure to get your prescription  updated if your vision changes. Medicine review. Work with your health care provider to regularly review all of the medicines you are taking, including over-the-counter medicines. Ask your health care provider about any side effects that may make you more likely to have a fall. Tell your health care provider if any medicines that you take make you feel dizzy or sleepy. Strength and balance checks. Your health care provider may recommend certain tests to check your strength and balance while standing, walking, or changing positions. Foot health exam. Foot pain and numbness, as well as not wearing proper footwear, can make you more likely to have a fall. Screenings, including: Osteoporosis screening. Osteoporosis is a condition that causes the bones to get weaker and break more easily. Blood pressure screening. Blood pressure changes and medicines to control blood pressure can make you feel dizzy. Depression screening. You may be more likely to have a fall if you have a fear of falling, feel depressed, or feel unable to do activities that you used to do. Alcohol use screening. Using too much alcohol can affect your balance and may make you more likely to have a fall. Follow these instructions at home: Lifestyle Do not drink alcohol if: Your health care provider tells you not to drink. If you drink alcohol: Limit how much you have to: 0-1 drink a day for women. 0-2 drinks a day for men. Know how much alcohol is in your drink. In the [place]  one drink equals one 12 oz bottle of beer (355 mL), one 5 oz glass of wine (148 mL), or one 1 oz glass of hard liquor (44 mL). Do not use any products that contain nicotine or tobacco. These products include cigarettes, chewing tobacco, and vaping devices, such as e-cigarettes. If you need help quitting, ask your health care provider. Activity  Follow a regular exercise program to stay fit. This will help you maintain your balance. Ask your health care provider  what types of exercise are appropriate for you. If you need a cane or walker, use it as recommended by your health care provider. Wear supportive shoes that have nonskid soles. Safety  Remove any tripping hazards, such as rugs, cords, and clutter. Install safety equipment such as grab bars in bathrooms and safety rails on stairs. Keep rooms and walkways well-lit. General instructions Talk with your health care provider about your risks for falling. Tell your health care provider if: You fall. Be sure to tell your health care provider about all falls, even ones that seem minor. You feel dizzy, tiredness (fatigue), or off-balance. Take over-the-counter and prescription medicines only as told by your health care provider. These include supplements. Eat a healthy diet and maintain a healthy weight. A healthy diet includes low-fat dairy products, low-fat (lean) meats, and fiber from whole grains, beans, and lots of fruits and vegetables. Stay current with your vaccines. Schedule regular health, dental, and eye exams. Summary Having a healthy lifestyle and getting preventive care can help to protect your health and wellness after age 84. Screening and testing are the best way to find a health problem early and help you avoid having a fall. Early diagnosis and treatment give you the best chance for managing medical conditions that are more common for people who are older than age 19. Falls are a major cause of broken bones and head injuries in people who are older than age 100. Take precautions to prevent a fall at home. Work with your health care provider to learn what changes you can make to improve your health and wellness and to prevent falls. This information is not intended to replace advice given to you by your health care provider. Make sure you discuss any questions you have with your health care provider. Document Revised: 12/21/2020 Document Reviewed: 12/21/2020 [employer] Patient Education   2024 [employer].

## 2025-03-20 ENCOUNTER — Ambulatory Visit: Admitting: Family Medicine

## 2025-05-20 ENCOUNTER — Ambulatory Visit
